# Patient Record
Sex: Male | Born: 1945
Health system: Southern US, Community
[De-identification: ages and names within clinical notes are randomized; demographics above are authoritative.]

## PROBLEM LIST (undated history)

## (undated) DIAGNOSIS — D589 Hereditary hemolytic anemia, unspecified: Secondary | ICD-10-CM

## (undated) DIAGNOSIS — C3491 Malignant neoplasm of unspecified part of right bronchus or lung: Secondary | ICD-10-CM

## (undated) DIAGNOSIS — Z87898 Personal history of other specified conditions: Secondary | ICD-10-CM

## (undated) DIAGNOSIS — I10 Essential (primary) hypertension: Secondary | ICD-10-CM

## (undated) DIAGNOSIS — J189 Pneumonia, unspecified organism: Secondary | ICD-10-CM

## (undated) DIAGNOSIS — J382 Nodules of vocal cords: Secondary | ICD-10-CM

## (undated) HISTORY — DX: Nodules of vocal cords: J38.2

## (undated) HISTORY — DX: Personal history of other specified conditions: Z87.898

## (undated) HISTORY — DX: Essential (primary) hypertension: I10

---

## 1975-12-03 HISTORY — PX: THROAT SURGERY: SHX803

## 1995-11-02 HISTORY — PX: COLONOSCOPY: SHX174

## 2003-07-08 ENCOUNTER — Ambulatory Visit (HOSPITAL_COMMUNITY): Admission: RE | Admit: 2003-07-08 | Discharge: 2003-07-08 | Payer: Self-pay | Admitting: Orthopedic Surgery

## 2003-07-08 ENCOUNTER — Encounter: Payer: Self-pay | Admitting: Orthopedic Surgery

## 2005-04-15 ENCOUNTER — Ambulatory Visit: Payer: Self-pay | Admitting: Internal Medicine

## 2010-12-11 ENCOUNTER — Encounter: Payer: Self-pay | Admitting: Internal Medicine

## 2010-12-11 ENCOUNTER — Ambulatory Visit
Admission: RE | Admit: 2010-12-11 | Discharge: 2010-12-11 | Payer: Self-pay | Source: Home / Self Care | Attending: Urgent Care | Admitting: Urgent Care

## 2010-12-11 DIAGNOSIS — R195 Other fecal abnormalities: Secondary | ICD-10-CM | POA: Insufficient documentation

## 2010-12-14 ENCOUNTER — Encounter: Payer: Self-pay | Admitting: Internal Medicine

## 2010-12-21 ENCOUNTER — Ambulatory Visit (HOSPITAL_COMMUNITY)
Admission: RE | Admit: 2010-12-21 | Discharge: 2010-12-21 | Payer: Self-pay | Source: Home / Self Care | Attending: Internal Medicine | Admitting: Internal Medicine

## 2010-12-24 LAB — DIFFERENTIAL
Lymphocytes Relative: 41 % (ref 12–46)
Lymphs Abs: 3.5 10*3/uL (ref 0.7–4.0)
Monocytes Relative: 9 % (ref 3–12)
Neutro Abs: 4.1 10*3/uL (ref 1.7–7.7)
Neutrophils Relative %: 48 % (ref 43–77)

## 2010-12-24 LAB — CBC
MCHC: 36.6 g/dL — ABNORMAL HIGH (ref 30.0–36.0)
MCV: 86.3 fL (ref 78.0–100.0)
Platelets: 152 10*3/uL (ref 150–400)
RBC: 4.15 MIL/uL — ABNORMAL LOW (ref 4.22–5.81)
RDW: 13 % (ref 11.5–15.5)
WBC: 8.5 10*3/uL (ref 4.0–10.5)

## 2010-12-30 ENCOUNTER — Encounter: Payer: Self-pay | Admitting: Internal Medicine

## 2011-01-01 ENCOUNTER — Encounter (INDEPENDENT_AMBULATORY_CARE_PROVIDER_SITE_OTHER): Payer: Self-pay

## 2011-01-01 NOTE — Op Note (Signed)
  NAME:  Richard Evans, Richard Evans                 ACCOUNT NO.:  [id number]  MEDICAL RECORD NO.:  [id number]          PATIENT TYPE:  AMB  LOCATION:  DAY                           FACILITY:  [facility]  PHYSICIAN:  R. Roetta Sessions, M.D. DATE OF BIRTH:  Sep 14, 1946  DATE OF PROCEDURE:  12/21/2010 DATE OF DISCHARGE:                              OPERATIVE REPORT   PROCEDURE:  Colonoscopy biopsy.  INDICATIONS FOR PROCEDURE:  A 65 year old gentleman, referred by Dr. Sherril Croon in [place] for Hemoccult positive stool.  He is really devoid of any lower GI tract symptoms, last colonoscopy in 1996, found have diverticulosis and external hemorrhoids.  No family history of polyps or colon cancer.  Risks, benefits, limitations, alternatives, and imponderables have been reviewed, questions answered.  Please see the documentation medical record.  PROCEDURE NOTE:  O2 saturation, blood pressure, pulse, and respirations monitored throughout the entirety of the procedure.CONSCIOUS SEDATION:  Versed 5 mg IV and Demerol 100 mg IV in divided doses.  INSTRUMENT:  Pentax video chip system.  FINDINGS:  Digital rectal exam revealed no abnormalities.  Endoscopic findings:  Prep was adequate.  Colon:  Colonic mucosa was surveyed from the rectosigmoid junction through the left transverse right colon, the appendiceal orifice, ileocecal valve/cecum.  These structures well seen and photographed for the record.  From this level, scope was slowly and cautiously withdrawn.  All previously mentioned mucosal surfaces were again seen.  The patient has extensive left-sided diverticula and some surrounding submucosal petechiae.  There was a single diminutive polyp, the midsigmoid was cold biopsied/remainder of colonic mucosa appeared entirely normal.  Scope was pulled down into the rectum where thorough examination of the rectal mucosa including retroflexed view of the anal verge demonstrated a small internal hemorrhoidal tag only.  The  patient tolerated the procedure well.  Cecal withdrawal time 9 minutes.  IMPRESSION:  Small internal hemorrhoids, otherwise normal rectum, left- sided diverticula, some submucosal petechiae of doubtful clinical significance, single diminutive sigmoid polyp, status post cold biopsy removal.  RECOMMENDATIONS: 1. Polyp and diverticulosis literature was provided to Mr. Kassing. 2. Follow up on path. 3. Baseline CBC today. 4. Further recommendations to follow.     Jonathon Bellows, M.D.     RMR/MEDQ  D:  12/21/2010  T:  12/21/2010  Job:  [id number]  cc:   Doreen Beam, MD Fax: [phone]  Electronically Signed by Lorrin Goodell M.D. on 01/01/2011 01:39:14 PM

## 2011-01-02 ENCOUNTER — Encounter: Payer: Self-pay | Admitting: Internal Medicine

## 2011-01-02 DIAGNOSIS — D649 Anemia, unspecified: Secondary | ICD-10-CM | POA: Insufficient documentation

## 2011-01-03 NOTE — Letter (Signed)
Summary: REFERRAL FROM [facility]  REFERRAL FROM [facility]   Imported By: Rexene Alberts 12/14/2010 11:45:31  _____________________________________________________________________  External Attachment:    Type:   Image     Comment:   External Document

## 2011-01-03 NOTE — Letter (Signed)
Summary: [facility] ORDER  [facility] ORDER   Imported By: Ave Filter 12/11/2010 13:52:03  _____________________________________________________________________  External Attachment:    Type:   Image     Comment:   External Document

## 2011-01-03 NOTE — Assessment & Plan Note (Signed)
Summary: BLOOD IN STOOL,CONSULT FOR TCS/SS   Visit Type:  Initial Consult Primary Care Provider:  Dr Sherril Croon  Chief Complaint:  heme positive stool, bloating, and consult for tcs.  History of Present Illness: 65 y/o caucasian male here for evaluation of positive iFOBT and to set up colonoscopy.  c/o abd bloating.  c/o early satiety.  Denies nausea, vomiting, heartburn or indigestion. Denies wt loss.  Denies constipation or diarrhea.  Denies NSAIDS.  Current Problems (verified): 1)  Blood in Stool, Occult  (ICD-792.1)  Current Medications (verified): 1)  Atenolol 25 Mg Tabs (Atenolol) .... Once Daily 2)  Flomax 0.4 Mg Caps (Tamsulosin Hcl) .... Once Daily 3)  Complete Allergy 25 Mg Caps (Diphenhydramine Hcl) .... Once Daily  Allergies (verified): 1)  ! Prednisone  Past History:  Past Medical History: htn hx epistaxis-on ASA, required intervention at [facility] benign vocal cord nodule colonoscopy Dr Karilyn Cota 12/96->diverticulosis/ext hemorrhoids  Past Surgical History: Unremarkable  Family History: No known family history of colorectal carcinoma, IBD, liver or chronic GI problems.  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  Alcohol Use - no, quit 12/01/2009 Illicit Drug Use - no Patient gets regular exercise. Smoking Status:  quit Drug Use:  no Does Patient Exercise:  yes  Review of Systems General:  Denies fever, chills, sweats, anorexia, fatigue, weakness, malaise, weight loss, and sleep disorder. CV:  Denies chest pains, angina, palpitations, syncope, dyspnea on exertion, orthopnea, PND, peripheral edema, and claudication. Resp:  Denies dyspnea at rest, dyspnea with exercise, cough, sputum, wheezing, coughing up blood, and pleurisy. GI:  Denies difficulty swallowing, pain on swallowing, nausea, indigestion/heartburn, vomiting, vomiting blood, abdominal pain, jaundice, diarrhea, constipation, change in  bowel habits, bloody BM's, black BMs, and fecal incontinence. GU:  Complains of urinary frequency; denies urinary burning, blood in urine, urinary hesitancy, nocturnal urination, and urinary incontinence. MS:  Denies joint pain / LOM, joint swelling, joint stiffness, joint deformity, low back pain, muscle weakness, muscle cramps, muscle atrophy, leg pain at night, leg pain with exertion, and shoulder pain / LOM hand / wrist pain (CTS). Derm:  Denies rash, itching, dry skin, hives, moles, warts, and unhealing ulcers. Psych:  Denies depression, anxiety, memory loss, suicidal ideation, hallucinations, paranoia, phobia, and confusion. Heme:  Denies bruising, bleeding, and enlarged lymph nodes.  Vital Signs:  Patient profile:   65 year old male Height:      72 inches Weight:      195 pounds BMI:     26.54 Temp:     97.7 degrees F oral Pulse rate:   60 / minute BP sitting:   130 / 90  (left arm) Cuff size:   regular  Vitals Entered By: Hendricks Limes LPN (December 11, 2010 11:29 AM)  Physical Exam  General:  Well developed, well nourished, no acute distress. Head:  Normocephalic and atraumatic. Eyes:  Sclera clear, no icterus. Ears:  Normal auditory acuity. Nose:  No deformity, discharge,  or lesions. Mouth:  No deformity or lesions, dentition normal. Neck:  Supple; no masses or thyromegaly. Lungs:  Clear throughout to auscultation. Heart:  Regular rate and rhythm; no murmurs, rubs,  or bruits. Abdomen:  Soft, nontender and nondistended. No masses, hepatosplenomegaly or hernias noted. Normal bowel sounds.without guarding and without rebound.   Msk:  Symmetrical with no gross deformities. Normal posture. Pulses:  Normal pulses noted. Extremities:  No clubbing, cyanosis, edema or deformities noted. Neurologic:  Alert and  oriented x4;  grossly normal neurologically. Skin:  Intact without significant lesions or rashes. Cervical Nodes:  No significant cervical adenopathy. Psych:  Alert and  cooperative. Normal mood and affect.  Impression & Recommendations:  Problem # 1:  BLOOD IN STOOL, OCCULT (ICD-792.1)  65 y/o caucasian male w/ iFOBT positive.  Needs colonoscopy to r/o colorectal carcinoma.  Devoid of any other GI symptoms.  Diagnostic colonoscopy to be performed by Dr. Suszanne Conners Rourk in the near future.  I have discussed risks and benefits which include, but are not limited to, bleeding, infection, perforation, or medication reaction.  The patient agrees with this plan and consent will be obtained.  Orders: Consultation Level III [phone])

## 2011-01-09 NOTE — Miscellaneous (Signed)
Summary: Orders Update  Clinical Lists Changes  Problems: Added new problem of ANEMIA, MILD (ICD-285.9) Orders: Added new Test order of T-CBC w/Diff [phone]) - Signed

## 2011-01-09 NOTE — Letter (Addendum)
Summary: Patient Notice, Colon Biopsy Results  [facility]  [address]   [place], [place] [address]   Phone: [phone]  Fax: [phone]       December 30, 2010   ASHAZ ROBLING [address] [place], [place]  [address] 1946/03/14    Dear Mr. MCIVER,  I am pleased to inform you that the biopsies taken during your recent colonoscopy did not show any evidence of cancer upon pathologic examination.  Additional information/recommendations:  No further action is needed at this time.  Please follow-up with your primary care physician for your other healthcare needs.  You should have a repeat colonoscopy examination  in 10 years.  Please call us if you are having persistent problems or have questions about your condition that have not been fully answered at this time.  Sincerely,    R. Roetta Sessions MD, FACP [facility] Ph: [phone]    Fax: [phone]   Appended Document: Patient Notice, Colon Biopsy Results letter mailed to pt  Appended Document: Patient Notice, Colon Biopsy Results reminder in epic

## 2011-01-09 NOTE — Miscellaneous (Signed)
Summary: CBC  Clinical Lists Changes L-CBC-with Differential - STATUS: Final                                            Perform Date: 20Jan12 10:51  Ordered By: Jena Gauss MD , Gerrit Friends           Ordered Date: 20Jan12 10:42                                       Last Updated Date: 20Jan12 11:26  Facility: [facility]                               Department: [facility]  Accession #: [id number]                   USN:       [id number]  Findings  Result Name                              Result     Abnl   Normal Range     Units      Perf. Loc.  WBC                                           8.5               4.0-10.5         K/uL  RBC                                           4.15       l      4.22-5.81        MIL/uL  Hemoglobin (HGB)                       13.1              13.0-17.0        g/dL  Hematocrit (HCT)                         35.8       l      39.0-52.0        %  MCV                                           86.3              78.0-100.0       fL  MCH -                                         31.6              26.0-34.0  pg  MCHC                                         36.6       h      30.0-36.0        g/dL  RDW                                          13.0              11.5-15.5        %  Platelet Count (PLT)                     152               150-400          K/uL  Neutrophils, %                              48                43-77            %  Lymphocytes, %                           41                12-46            %  Monocytes, %                               9                 3-12             %  Eosinophils, %                               1                 0-5              %  Basophils, %                                  0                 0-1              %  Neutrophils, Absolute                     4.1               1.7-7.7          K/uL  Lymphocytes, Absolute                  3.5               0.7-4.0          K/uL  Monocytes, Absolute  0.8                0.1-1.0          K/uL  Eosinophils, Absolute                      0.1               0.0-0.7          K/uL  Basophils, Absolute                        0.0               0.0-0.1          K/uL  Additional Information  HL7 RESULT STATUS : F  External IF Update Timestamp : 2010-12-21:11:24:00.000000  Appended Document: CBC mild anemia; would repeat cbc in 6 weeks  Appended Document: CBC pt aware, lab order on file

## 2011-01-31 ENCOUNTER — Encounter (INDEPENDENT_AMBULATORY_CARE_PROVIDER_SITE_OTHER): Payer: Self-pay | Admitting: *Deleted

## 2011-02-07 NOTE — Letter (Signed)
Summary: Recall, Labs Needed  [facility]  [address]   [place], [place] [address]   Phone: [phone]  Fax: [phone]    January 31, 2011  Richard Evans [address] [place], [place]  [address] 1946/10/28   Dear Richard Evans,   Our records indicate it is time to repeat your blood work.  You can take the enclosed form to the lab on or near the date indicated.  Please make note of the new location of the lab:   [address], [address] floor   [facility]  Our office will call you within a week to ten business days with the results.  If you do not hear from [place] in 10 business days, you should call the office.  If you have any questions regarding this, call the office at [phone], and ask for the nurse.  Labs are due on  13 February 2011  Sincerely,    Carolan Clines LPN  [facility] Ph: [phone]   Fax: [phone]

## 2011-02-13 ENCOUNTER — Encounter: Payer: Self-pay | Admitting: Internal Medicine

## 2011-02-18 ENCOUNTER — Encounter: Payer: Self-pay | Admitting: Internal Medicine

## 2011-02-18 LAB — CONVERTED CEMR LAB
Eosinophils Absolute: 0.2 10*3/uL (ref 0.0–0.7)
Lymphocytes Relative: 43 % (ref 12–46)
Lymphs Abs: 3.3 10*3/uL (ref 0.7–4.0)
MCHC: 36.2 g/dL — ABNORMAL HIGH (ref 30.0–36.0)
Monocytes Absolute: 0.8 10*3/uL (ref 0.1–1.0)
Neutro Abs: 3.3 10*3/uL (ref 1.7–7.7)
Neutrophils Relative %: 43 % (ref 43–77)
RBC: 4.02 M/uL — ABNORMAL LOW (ref 4.22–5.81)
WBC: 7.6 10*3/uL (ref 4.0–10.5)

## 2011-02-28 NOTE — Miscellaneous (Signed)
Summary: Orders Update  Clinical Lists Changes  Orders: Added new Test order of T-CBC w/Diff ([id number]) - Signed 

## 2011-06-12 ENCOUNTER — Other Ambulatory Visit: Payer: Self-pay | Admitting: Internal Medicine

## 2011-06-12 LAB — CBC WITH DIFFERENTIAL/PLATELET
Hemoglobin: 13.4 g/dL (ref 13.0–17.0)
Lymphs Abs: 3 10*3/uL (ref 0.7–4.0)
Monocytes Relative: 10 % (ref 3–12)
Neutro Abs: 3.5 10*3/uL (ref 1.7–7.7)
Neutrophils Relative %: 47 % (ref 43–77)
Platelets: 157 10*3/uL (ref 150–400)
RBC: 3.85 MIL/uL — ABNORMAL LOW (ref 4.22–5.81)
WBC: 7.4 10*3/uL (ref 4.0–10.5)

## 2016-02-08 DIAGNOSIS — I10 Essential (primary) hypertension: Secondary | ICD-10-CM | POA: Diagnosis not present

## 2016-02-08 DIAGNOSIS — K5792 Diverticulitis of intestine, part unspecified, without perforation or abscess without bleeding: Secondary | ICD-10-CM | POA: Diagnosis not present

## 2016-02-08 DIAGNOSIS — Z87891 Personal history of nicotine dependence: Secondary | ICD-10-CM | POA: Diagnosis not present

## 2016-03-19 DIAGNOSIS — M25561 Pain in right knee: Secondary | ICD-10-CM | POA: Diagnosis not present

## 2016-06-28 DIAGNOSIS — R0789 Other chest pain: Secondary | ICD-10-CM | POA: Diagnosis not present

## 2016-06-28 DIAGNOSIS — R0600 Dyspnea, unspecified: Secondary | ICD-10-CM | POA: Diagnosis not present

## 2016-06-30 ENCOUNTER — Encounter (HOSPITAL_COMMUNITY): Payer: Self-pay

## 2016-06-30 ENCOUNTER — Emergency Department (HOSPITAL_COMMUNITY): Payer: Medicare Other

## 2016-06-30 ENCOUNTER — Inpatient Hospital Stay (HOSPITAL_COMMUNITY)
Admission: EM | Admit: 2016-06-30 | Discharge: 2016-07-04 | DRG: 810 | Disposition: A | Discharge status: Home / Self Care | Payer: Medicare Other | Attending: Family Medicine | Admitting: Family Medicine

## 2016-06-30 DIAGNOSIS — K5790 Diverticulosis of intestine, part unspecified, without perforation or abscess without bleeding: Secondary | ICD-10-CM | POA: Diagnosis present

## 2016-06-30 DIAGNOSIS — R0602 Shortness of breath: Secondary | ICD-10-CM | POA: Diagnosis not present

## 2016-06-30 DIAGNOSIS — R5383 Other fatigue: Secondary | ICD-10-CM | POA: Diagnosis not present

## 2016-06-30 DIAGNOSIS — R161 Splenomegaly, not elsewhere classified: Secondary | ICD-10-CM | POA: Diagnosis not present

## 2016-06-30 DIAGNOSIS — D589 Hereditary hemolytic anemia, unspecified: Secondary | ICD-10-CM | POA: Diagnosis present

## 2016-06-30 DIAGNOSIS — Z87891 Personal history of nicotine dependence: Secondary | ICD-10-CM

## 2016-06-30 DIAGNOSIS — D591 Other autoimmune hemolytic anemias: Secondary | ICD-10-CM | POA: Diagnosis not present

## 2016-06-30 DIAGNOSIS — I2510 Atherosclerotic heart disease of native coronary artery without angina pectoris: Secondary | ICD-10-CM | POA: Diagnosis present

## 2016-06-30 DIAGNOSIS — D598 Other acquired hemolytic anemias: Secondary | ICD-10-CM | POA: Diagnosis not present

## 2016-06-30 DIAGNOSIS — N400 Enlarged prostate without lower urinary tract symptoms: Secondary | ICD-10-CM | POA: Diagnosis present

## 2016-06-30 DIAGNOSIS — I4510 Unspecified right bundle-branch block: Secondary | ICD-10-CM | POA: Diagnosis present

## 2016-06-30 DIAGNOSIS — D588 Other specified hereditary hemolytic anemias: Secondary | ICD-10-CM | POA: Diagnosis not present

## 2016-06-30 DIAGNOSIS — K5730 Diverticulosis of large intestine without perforation or abscess without bleeding: Secondary | ICD-10-CM | POA: Diagnosis not present

## 2016-06-30 DIAGNOSIS — I10 Essential (primary) hypertension: Secondary | ICD-10-CM

## 2016-06-30 DIAGNOSIS — D72829 Elevated white blood cell count, unspecified: Secondary | ICD-10-CM | POA: Diagnosis not present

## 2016-06-30 DIAGNOSIS — D649 Anemia, unspecified: Secondary | ICD-10-CM | POA: Diagnosis present

## 2016-06-30 DIAGNOSIS — R739 Hyperglycemia, unspecified: Secondary | ICD-10-CM

## 2016-06-30 DIAGNOSIS — R17 Unspecified jaundice: Secondary | ICD-10-CM

## 2016-06-30 DIAGNOSIS — D582 Other hemoglobinopathies: Secondary | ICD-10-CM

## 2016-06-30 LAB — URINALYSIS, ROUTINE W REFLEX MICROSCOPIC
GLUCOSE, UA: NEGATIVE mg/dL
Hgb urine dipstick: NEGATIVE
KETONES UR: NEGATIVE mg/dL
LEUKOCYTES UA: NEGATIVE
Nitrite: NEGATIVE
PROTEIN: NEGATIVE mg/dL
Specific Gravity, Urine: 1.018 (ref 1.005–1.030)
pH: 5.5 (ref 5.0–8.0)

## 2016-06-30 LAB — IRON AND TIBC
Iron: 141 ug/dL (ref 45–182)
Saturation Ratios: 57 % — ABNORMAL HIGH (ref 17.9–39.5)
TIBC: 249 ug/dL — ABNORMAL LOW (ref 250–450)
UIBC: 108 ug/dL

## 2016-06-30 LAB — CBC
HCT: 22.4 % — ABNORMAL LOW (ref 39.0–52.0)
HCT: 22.8 % — ABNORMAL LOW (ref 39.0–52.0)
HEMOGLOBIN: 7.5 g/dL — AB (ref 13.0–17.0)
HEMOGLOBIN: 7.9 g/dL — AB (ref 13.0–17.0)
MCH: 35.5 pg — AB (ref 26.0–34.0)
MCH: 35.4 pg — AB (ref 26.0–34.0)
MCHC: 33.5 g/dL (ref 30.0–36.0)
MCHC: 34.6 g/dL (ref 30.0–36.0)
MCV: 106.2 fL — ABNORMAL HIGH (ref 78.0–100.0)
MCV: 102.2 fL — AB (ref 78.0–100.0)
Platelets: 169 10*3/uL (ref 150–400)
Platelets: 164 10*3/uL (ref 150–400)
RBC: 2.11 MIL/uL — AB (ref 4.22–5.81)
RBC: 2.23 MIL/uL — AB (ref 4.22–5.81)
RDW: 23.1 % — ABNORMAL HIGH (ref 11.5–15.5)
RDW: 21.8 % — ABNORMAL HIGH (ref 11.5–15.5)
WBC: 14.1 10*3/uL — ABNORMAL HIGH (ref 4.0–10.5)
WBC: 12.9 10*3/uL — ABNORMAL HIGH (ref 4.0–10.5)

## 2016-06-30 LAB — DIC (DISSEMINATED INTRAVASCULAR COAGULATION) PANEL
APTT: 39 s — AB (ref 24–36)
FIBRINOGEN: 327 mg/dL (ref 210–475)
PLATELETS: 175 10*3/uL (ref 150–400)
SMEAR REVIEW: NONE SEEN

## 2016-06-30 LAB — DIFFERENTIAL
BASOS PCT: 0 %
Basophils Absolute: 0 10*3/uL (ref 0.0–0.1)
EOS PCT: 1 %
Eosinophils Absolute: 0.1 10*3/uL (ref 0.0–0.7)
Lymphocytes Relative: 53 %
Lymphs Abs: 6.6 10*3/uL — ABNORMAL HIGH (ref 0.7–4.0)
MONOS PCT: 10 %
Monocytes Absolute: 1.2 10*3/uL — ABNORMAL HIGH (ref 0.1–1.0)
NEUTROS ABS: 4.5 10*3/uL (ref 1.7–7.7)
Neutrophils Relative %: 36 %

## 2016-06-30 LAB — COMPREHENSIVE METABOLIC PANEL
ALT: 27 U/L (ref 17–63)
ANION GAP: 6 (ref 5–15)
AST: 41 U/L (ref 15–41)
Albumin: 4.1 g/dL (ref 3.5–5.0)
Alkaline Phosphatase: 70 U/L (ref 38–126)
BILIRUBIN TOTAL: 4.7 mg/dL — AB (ref 0.3–1.2)
BUN: 20 mg/dL (ref 6–20)
CO2: 23 mmol/L (ref 22–32)
Calcium: 8.5 mg/dL — ABNORMAL LOW (ref 8.9–10.3)
Chloride: 107 mmol/L (ref 101–111)
Creatinine, Ser: 1.07 mg/dL (ref 0.61–1.24)
GFR calc Af Amer: >60 mL/min (ref >60)
Glucose, Bld: 218 mg/dL — ABNORMAL HIGH (ref 65–99)
POTASSIUM: 3.9 mmol/L (ref 3.5–5.1)
Sodium: 136 mmol/L (ref 135–145)
TOTAL PROTEIN: 6.6 g/dL (ref 6.5–8.1)

## 2016-06-30 LAB — DIC (DISSEMINATED INTRAVASCULAR COAGULATION)PANEL
D-Dimer, Quant: 2.7 ug/mL-FEU — ABNORMAL HIGH (ref 0.00–0.50)
INR: 1.21
Prothrombin Time: 15.3 seconds — ABNORMAL HIGH (ref 11.4–15.2)

## 2016-06-30 LAB — DIRECT ANTIGLOBULIN TEST (NOT AT ARMC)
DAT, COMPLEMENT: POSITIVE
DAT, IgG: POSITIVE

## 2016-06-30 LAB — I-STAT TROPONIN, ED: TROPONIN I, POC: 0 ng/mL (ref 0.00–0.08)

## 2016-06-30 LAB — RETICULOCYTES
RBC.: 1.3 MIL/uL — ABNORMAL LOW (ref 4.22–5.81)
RETIC COUNT ABSOLUTE: 258.7 10*3/uL — AB (ref 19.0–186.0)
RETIC CT PCT: 19.9 % — AB (ref 0.4–3.1)

## 2016-06-30 LAB — HEPATIC FUNCTION PANEL
ALT: 29 U/L (ref 17–63)
AST: 52 U/L — AB (ref 15–41)
Albumin: 3.9 g/dL (ref 3.5–5.0)
Alkaline Phosphatase: 69 U/L (ref 38–126)
BILIRUBIN DIRECT: 0.6 mg/dL — AB (ref 0.1–0.5)
BILIRUBIN INDIRECT: 4.3 mg/dL — AB (ref 0.3–0.9)
Total Bilirubin: 4.9 mg/dL — ABNORMAL HIGH (ref 0.3–1.2)
Total Protein: 6.7 g/dL (ref 6.5–8.1)

## 2016-06-30 LAB — LACTATE DEHYDROGENASE: LDH: 575 U/L — ABNORMAL HIGH (ref 98–192)

## 2016-06-30 LAB — PROTIME-INR
INR: 1.2
PROTHROMBIN TIME: 15.3 s — AB (ref 11.4–15.2)

## 2016-06-30 LAB — FERRITIN: FERRITIN: 739 ng/mL — AB (ref 24–336)

## 2016-06-30 LAB — VITAMIN B12: Vitamin B-12: 301 pg/mL (ref 180–914)

## 2016-06-30 LAB — FOLATE: Folate: 17.5 ng/mL (ref >5.9)

## 2016-06-30 LAB — SAVE SMEAR

## 2016-06-30 LAB — TSH: TSH: 2.262 u[IU]/mL (ref 0.350–4.500)

## 2016-06-30 LAB — LIPASE, BLOOD: LIPASE: 36 U/L (ref 11–51)

## 2016-06-30 LAB — BRAIN NATRIURETIC PEPTIDE: B NATRIURETIC PEPTIDE 5: 163.2 pg/mL — AB (ref 0.0–100.0)

## 2016-06-30 LAB — POC OCCULT BLOOD, ED: FECAL OCCULT BLD: NEGATIVE

## 2016-06-30 MED ORDER — ACETAMINOPHEN 650 MG RE SUPP: 650.0000 mg | Freq: Four times a day (QID) | RECTAL | Status: DC | PRN

## 2016-06-30 MED ORDER — DIPHENHYDRAMINE HCL 25 MG PO TABS
25.0000 mg | ORAL_TABLET | Freq: Every day | ORAL | Status: DC
  Filled 2016-06-30: qty 1

## 2016-06-30 MED ORDER — SODIUM CHLORIDE 0.9% FLUSH: 3.0000 mL | INTRAVENOUS | Status: DC | PRN

## 2016-06-30 MED ORDER — SODIUM CHLORIDE 0.9% FLUSH
3.0000 mL | Freq: Two times a day (BID) | INTRAVENOUS | Status: DC
  Administered 2016-06-30 – 2016-07-04 (×5): 3 mL via INTRAVENOUS

## 2016-06-30 MED ORDER — TAMSULOSIN HCL 0.4 MG PO CAPS
0.4000 mg | ORAL_CAPSULE | Freq: Every day | ORAL | Status: DC
  Administered 2016-06-30 – 2016-07-03 (×4): 0.4 mg via ORAL
  Filled 2016-06-30 (×4): qty 1

## 2016-06-30 MED ORDER — ACETAMINOPHEN 325 MG PO TABS: 650.0000 mg | ORAL_TABLET | Freq: Four times a day (QID) | ORAL | Status: DC | PRN

## 2016-06-30 MED ORDER — ENOXAPARIN SODIUM 40 MG/0.4ML ~~LOC~~ SOLN
40.0000 mg | SUBCUTANEOUS | Status: DC
  Administered 2016-06-30 – 2016-07-03 (×4): 40 mg via SUBCUTANEOUS
  Filled 2016-06-30 (×4): qty 0.4

## 2016-06-30 MED ORDER — IOPAMIDOL (ISOVUE-370) INJECTION 76%
INTRAVENOUS | Status: AC
  Administered 2016-06-30: 100 mL
  Filled 2016-06-30: qty 100

## 2016-06-30 MED ORDER — POLYETHYLENE GLYCOL 3350 17 G PO PACK: 17.0000 g | PACK | Freq: Every day | ORAL | Status: DC | PRN

## 2016-06-30 MED ORDER — ATENOLOL 25 MG PO TABS
25.0000 mg | ORAL_TABLET | Freq: Every day | ORAL | Status: DC
  Administered 2016-06-30 – 2016-07-03 (×4): 25 mg via ORAL
  Filled 2016-06-30 (×4): qty 1

## 2016-06-30 MED ORDER — DIPHENHYDRAMINE HCL 25 MG PO CAPS
25.0000 mg | ORAL_CAPSULE | Freq: Every day | ORAL | Status: DC
  Administered 2016-06-30 – 2016-07-03 (×4): 25 mg via ORAL
  Filled 2016-06-30 (×4): qty 1

## 2016-06-30 MED ORDER — METHYLPREDNISOLONE SODIUM SUCC 125 MG IJ SOLR
125.0000 mg | Freq: Once | INTRAMUSCULAR | Status: AC
  Administered 2016-06-30: 125 mg via INTRAVENOUS
  Filled 2016-06-30: qty 2

## 2016-06-30 MED ORDER — SODIUM CHLORIDE 0.9 % IV SOLN
250.0000 mL | INTRAVENOUS | Status: DC | PRN
Start: 2016-06-30 — End: 2016-07-04

## 2016-06-30 NOTE — H&P (Signed)
[facility] Admission History and Physical Service Pager: [phone]  Patient name: MERLE CIRELLI Medical record number: [id number] Date of birth: August 22, 1946 Age: 70 y.o. Gender: male  Primary Care Provider: [name], MD Consultants: Hematology Code Status: FULL  Chief Complaint: SOB and jaundice  Assessment and Plan: ELIAM SNAPP is a 70 y.o. male presenting with SOB and jaundice. PMH is significant for HTN, h/o epistaxis w/anemia, diverticulosis.   SOB in the setting of anemia with jaundice. Hgb 7.5 with Tbili 4.9 and indirect bili of 4.3. CT abd shows splenomegaly without cirrhosis or pancreatic mass. CT chest shows b/l hilar adenopathy, neg for PE. FOBT neg. Has scleral icterus. No pruritis. LFTs WNL. UA positive for bilirubin and negative for HgB. Given presentation of anemia, jaundice, lack of active bleeding, and h/o dark urine concerned for hemolytic anemia. Jaundice unlikely to be caused by hepatobiliary system as LFTs and Alk Phos are WNL and CT abdomen is unremarkable. SOB likely secondary to symptomatic anemia. Patient does have mildly elevated BNP (162) but no signs of volume overload on exam. Unknown etiology of potential hemolytic anemia. Patient does not have h/o recent infections, not septic at presentation, no offending medications. Potentially an autoimmune cause or trapping of RBCs in spleen (CT abdomen showed progressive splenomegaly from previous CT in 2013).  - Admit to inpatient, under Dr. Andria Frames - Heme/onc consulted, appreciate recomendations. - Per heme/onc, transfuse Hgb <7 and steroids: recommended prednisone 60m/kg up to 1058m Given solumedrol 12521m 1 in ED d/t intolerance of prednisone. - LDH, haptoglobin, and retic count pending to confirm hemolytic anemia  - coombs test -peripheral smear ordered  - monitor H/H - DIC panel pending -type and screen ordered  -insert 2nd peripheral large bore IV  -ferritin, iron panel, folate,  vitamin b12 pending   HTN at home on atenolol 52m57ms. BP stable at admission.  - continue home med  BPH at home on flomax 0.4mg 26m- continue home med  CAD found on CT imaging. Patient states has been on medication in the past, was taken off by PCP d/t good control.  - consider start atorvastatin 40mg 57miven risk factors -patient likely not a good candidate for daily ASA given h/o severe epistasis with ASA   FEN/GI: heart healthy diet Prophylaxis: lovenox  Disposition: pending  History of Present Illness:  Candice MICKEL SCHREUR70 y.o13male presenting with shortness of breath and yellow skin. [place], returned 2 weeks ago and noticed was getting winded with exertion, progressively worsening. Has previously been able to cut grass/heavy chores but became short of breath walking to his garden or to his mailbox. Went to PCP on Friday and had EKG but otherwise no work up. Denies SOB at rest. No orthopnea. Has never had a similar episode in the past despite going camping in summer heat on a yearly basis.  Is accompanied by wife and daughter, who noticed that he started to appear yellow. Patient is not sure when he started looking yellow but first time anyone noticed it was today. Is very tan at baseline. Also endorses having dark urine down at the beach present for 3-4 weeks and yellow stools since yesterday. Denies melena and hematochezia. No history of jaundice. No recent illnesses. No weight loss, night sweats. No abdominal pain or chest pain. No epistaxis or bleeding gums. Denies active bleeding. No new medications or antibiotics.   Review  Of Systems: Per HPI otherwise the remainder of the systems were negative.  Patient Active Problem List   Diagnosis Date Noted  . Hemolytic anemia ([place]) 06/30/2016  . ANEMIA, MILD 01/02/2011  . BLOOD IN STOOL, OCCULT 12/11/2010    Past Medical History: Past Medical History:  Diagnosis Date  .  History of epistaxis    hx [facility]axis [facility]SA[facility]  . Hypertension   . Vocal cord nodule    benign    Past Surgical History: Past Surgical History:  Procedure Laterality Date  . COLONOSCOPY  12/96   Dr. Rehman>diverticulosis/ external hemorrhoids    Social History: Social History  Substance Use Topics  . Smoking status: Never Smoker  . Smokeless tobacco: Never Used  . Alcohol use Not on file   Additional social history: Lives at home with his wife. Quit smoking 7 years ago. Smoked 1.5 ppd for 50 years. No current alcohol use. No drug use.   Please also refer to relevant sections of EMR.  Family History: No family history of autoimmune conditions, pancreatitis, or jaundice.   Allergies and Medications: Allergies  Allergen Reactions  . Prednisone Other (See Comments)    CHEST PAINS  . Adhesive [Tape] Itching and Rash   No current facility-administered medications on file prior to encounter.    Current Outpatient Prescriptions on File Prior to Encounter  Medication Sig Dispense Refill  . atenolol (TENORMIN) 25 MG tablet Take 25 mg by mouth at bedtime.     . diphenhydrAMINE (BENADRYL) 25 MG tablet Take 25 mg by mouth at bedtime.     . Tamsulosin HCl (FLOMAX) 0.4 MG CAPS Take 0.4 mg by mouth daily after supper.       Objective: BP 140/67   Pulse 65   Temp 98.6 F (37 C) (Oral)   Resp 12   Ht 6' (1.829 m)   Wt 191 lb (86.6 kg)   SpO2 94%   BMI 25.90 kg/m  Exam: General: Laying comfortably in bed, in NAD Eyes: scleral icterus, yellow conjunctiva, EOMI, PERRL ENTM: MMM, Oropharynx clear  Neck: supple, no lymphadenopathy Cardiovascular: RRR, no murmurs noted, 2+ pedal pulses, no LE edema  Respiratory: CTAB, On room air. Normal effort Abdomen: mildly distended. Soft, nt, + bs. No rebound or guarding, negative murphy's sign, appreciable splenomegaly  MSK: moving limbs spontaneously Skin: Appears jaundiced. 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 Neuro: A&Ox3, no focal deficits Psych: appropriate affect, very pleasant  Labs and Imaging:  CBC    Component Value Date/Time   WBC 14.1 (H) 06/30/2016 1200   RBC 2.11 (L) 06/30/2016 1200   HGB 7.5 (L) 06/30/2016 1200   HCT 22.4 (L) 06/30/2016 1200   PLT 169 06/30/2016 1200   MCV 106.2 (H) 06/30/2016 1200   MCH 35.5 (H) 06/30/2016 1200   MCHC 33.5 06/30/2016 1200   RDW 23.1 (H) 06/30/2016 1200   LYMPHSABS 3.0 06/12/2011 0759   MONOABS 0.8 06/12/2011 0759   EOSABS 0.2 06/12/2011 0759   BASOSABS 0.0 06/12/2011 0759   Total Bilirubin 4.7 Indirect bilirubin 4.3 Direct bilirubin 0.6  Trop 0.00  BMET   Recent Labs Lab 06/30/16 1032  NA 136  K 3.9  CL 107  CO2 23  BUN 20  CREATININE 1.07  GLUCOSE 218*  CALCIUM 8.5*     Urinalysis    Component Value Date/Time  COLORURINE AMBER (A) 06/30/2016 1406   APPEARANCEUR CLEAR 06/30/2016 1406   LABSPEC 1.018 06/30/2016 1406   PHURINE 5.5 06/30/2016 1406   GLUCOSEU NEGATIVE 06/30/2016 1406   HGBUR NEGATIVE 06/30/2016 1406   BILIRUBINUR SMALL (A) 06/30/2016 1406   KETONESUR NEGATIVE 06/30/2016 1406   PROTEINUR NEGATIVE 06/30/2016 1406   NITRITE NEGATIVE 06/30/2016 1406   [place] 06/30/2016 1406     Dg Chest 2 View  Result Date: 06/30/2016 CLINICAL DATA:  Shortness breath over the last week with increasing weakness and lack of energy. EXAM: CHEST  2 VIEW COMPARISON:  None. FINDINGS: The heart size is normal. Mild interstitial coarsening appears chronic. There is retraction and scarring at the lung apices bilaterally, right greater than left. This is not significantly changed. No focal airspace disease is present. There is no edema or effusion to suggest failure. The visualized soft tissues and bony thorax are unremarkable. IMPRESSION: 1. No acute cardiopulmonary disease. 2. Mild interstitial  coarsening appears chronic. Electronically Signed   By: San Morelle M.D.   On: 06/30/2016 10:51  Ct Angio Chest Pe W Or Wo Contrast  Result Date: 06/30/2016 CLINICAL DATA:  Shortness breath and weakness for 1 week with progression. Painless jaundice. Shortness of breath. EXAM: CT ANGIOGRAPHY CHEST CT ABDOMEN AND PELVIS WITH CONTRAST TECHNIQUE: Multidetector CT imaging of the chest was performed using the standard protocol during bolus administration of intravenous contrast. Multiplanar CT image reconstructions and MIPs were obtained to evaluate the vascular anatomy. Multidetector CT imaging of the abdomen and pelvis was performed using the standard protocol during bolus administration of intravenous contrast. CONTRAST:  100 mL Isovue 370 COMPARISON:  CT of the abdomen and pelvis without contrast 04/02/2012 and 07/25/2011 FINDINGS: CTA CHEST FINDINGS Mediastinum/Lymph nodes: Pulmonary arterial opacification is excellent. There are no focal filling defects to suggest pulmonary emboli. The heart size is normal. Coronary artery calcifications are present. No significant pleural or pericardial effusion is present. Bilateral hilar adenopathy is present. Right-sided nodes measure up to 16 x 10 mm. Left-sided hilar soft tissue measures up to 21 x 14 mm. Subcarinal soft tissue measures up to 2 cm. Sub cm peritracheal nodes are present. Sub cm prevascular nodes are present. Vascular calcifications are present at the aortic arch without aneurysm or significant stenosis. Lungs/Pleura: Use interstitial prominence is noted. Extensive interlobular thickening is present. No significant reticulation is present. Emphysematous changes are noted at the lung apices, right greater than left. No significant airspace consolidation is present. There is some dependent atelectasis bilaterally. Musculoskeletal: Vertebral body heights alignment are maintained. The ribs are within normal limits. No focal lytic or blastic lesions are  present. CT ABDOMEN and PELVIS FINDINGS Hepatobiliary: No focal hepatic lesions are present. There is mild diffuse fatty infiltration of the liver. The common bile duct and gallbladder are within normal limits. Pancreas: No significant inflammatory changes are present. No solid or cystic mass lesion is present. There is no ductal dilation. Spleen: Splenomegaly has progressed. No focal lesions are present. Cephalo-caudad dimension is 14.6 cm. Adrenals/Urinary tract: The adrenal glands are normal bilaterally. A 17 mm cyst is noted posteriorly in the right kidney. An exophytic cyst laterally near the lower pole of the right kidney has slightly increased in size measuring 19 mm. No other discrete lesions are present. A punctate nonobstructing stone is again seen at the upper pole of the right kidney. The ureters are within normal limits bilaterally. A diverticulum is again noted posteriorly and laterally on the left within the urinary bladder.  The urinary bladder is otherwise within normal limits. Stomach/Bowel: The stomach and duodenum are within normal limits. The small bowel is unremarkable. The appendix is visualized and normal. The cecum and ascending colon are within normal limits. The transverse colon is unremarkable. Diverticular changes are present in the descending and sigmoid colon without focal inflammation to suggest diverticulitis. The rectum is within normal limits. Vascular/Lymphatic: Dense atherosclerotic calcifications are present throughout the aorta and branch vessels without aneurysm. Reproductive: The prostate gland is enlarged, measuring up to 5.2 cm, not significantly changed. Other: Fat herniates into the left inguinal canal without bowel. Musculoskeletal: Bilateral L5 pars defects are present. Grade 1 anterolisthesis and a vacuum disc at L5-S1 are stable. Vertebral body heights and alignment are otherwise normal. The bony pelvis is intact otherwise. Review of the MIP images confirms the above  findings. IMPRESSION: 1. No evidence for pulmonary embolus. 2. Mild diffuse interstitial changes and bilateral hilar adenopathy raise concern for sarcoid. No primary neoplasm is present. No focal pneumonia is present. Other interstitial disease is considered less likely. 3. Emphysema. 4. Progressive splenomegaly of unknown etiology. Early cirrhosis is not excluded in this patient with hepatic steatosis. 5. Right renal cyst. 6. Extensive atherosclerotic change without aneurysm. Coronary artery disease is present. 7. Sigmoid diverticulosis without diverticulitis. 8. Prostatic enlargement. 9. Left posterolateral bladder diverticulum without complication. Electronically Signed   By: San Morelle M.D.   On: 06/30/2016 13:45  Ct Abdomen Pelvis W Contrast  Result Date: 06/30/2016 CLINICAL DATA:  Shortness breath and weakness for 1 week with progression. Painless jaundice. Shortness of breath. EXAM: CT ANGIOGRAPHY CHEST CT ABDOMEN AND PELVIS WITH CONTRAST TECHNIQUE: Multidetector CT imaging of the chest was performed using the standard protocol during bolus administration of intravenous contrast. Multiplanar CT image reconstructions and MIPs were obtained to evaluate the vascular anatomy. Multidetector CT imaging of the abdomen and pelvis was performed using the standard protocol during bolus administration of intravenous contrast. CONTRAST:  100 mL Isovue 370 COMPARISON:  CT of the abdomen and pelvis without contrast 04/02/2012 and 07/25/2011 FINDINGS: CTA CHEST FINDINGS Mediastinum/Lymph nodes: Pulmonary arterial opacification is excellent. There are no focal filling defects to suggest pulmonary emboli. The heart size is normal. Coronary artery calcifications are present. No significant pleural or pericardial effusion is present. Bilateral hilar adenopathy is present. Right-sided nodes measure up to 16 x 10 mm. Left-sided hilar soft tissue measures up to 21 x 14 mm. Subcarinal soft tissue measures up to 2 cm.  Sub cm peritracheal nodes are present. Sub cm prevascular nodes are present. Vascular calcifications are present at the aortic arch without aneurysm or significant stenosis. Lungs/Pleura: Use interstitial prominence is noted. Extensive interlobular thickening is present. No significant reticulation is present. Emphysematous changes are noted at the lung apices, right greater than left. No significant airspace consolidation is present. There is some dependent atelectasis bilaterally. Musculoskeletal: Vertebral body heights alignment are maintained. The ribs are within normal limits. No focal lytic or blastic lesions are present. CT ABDOMEN and PELVIS FINDINGS Hepatobiliary: No focal hepatic lesions are present. There is mild diffuse fatty infiltration of the liver. The common bile duct and gallbladder are within normal limits. Pancreas: No significant inflammatory changes are present. No solid or cystic mass lesion is present. There is no ductal dilation. Spleen: Splenomegaly has progressed. No focal lesions are present. Cephalo-caudad dimension is 14.6 cm. Adrenals/Urinary tract: The adrenal glands are normal bilaterally. A 17 mm cyst is noted posteriorly in the right kidney. An exophytic cyst laterally near  the lower pole of the right kidney has slightly increased in size measuring 19 mm. No other discrete lesions are present. A punctate nonobstructing stone is again seen at the upper pole of the right kidney. The ureters are within normal limits bilaterally. A diverticulum is again noted posteriorly and laterally on the left within the urinary bladder. The urinary bladder is otherwise within normal limits. Stomach/Bowel: The stomach and duodenum are within normal limits. The small bowel is unremarkable. The appendix is visualized and normal. The cecum and ascending colon are within normal limits. The transverse colon is unremarkable. Diverticular changes are present in the descending and sigmoid colon without  focal inflammation to suggest diverticulitis. The rectum is within normal limits. Vascular/Lymphatic: Dense atherosclerotic calcifications are present throughout the aorta and branch vessels without aneurysm. Reproductive: The prostate gland is enlarged, measuring up to 5.2 cm, not significantly changed. Other: Fat herniates into the left inguinal canal without bowel. Musculoskeletal: Bilateral L5 pars defects are present. Grade 1 anterolisthesis and a vacuum disc at L5-S1 are stable. Vertebral body heights and alignment are otherwise normal. The bony pelvis is intact otherwise. Review of the MIP images confirms the above findings. IMPRESSION: 1. No evidence for pulmonary embolus. 2. Mild diffuse interstitial changes and bilateral hilar adenopathy raise concern for sarcoid. No primary neoplasm is present. No focal pneumonia is present. Other interstitial disease is considered less likely. 3. Emphysema. 4. Progressive splenomegaly of unknown etiology. Early cirrhosis is not excluded in this patient with hepatic steatosis. 5. Right renal cyst. 6. Extensive atherosclerotic change without aneurysm. Coronary artery disease is present. 7. Sigmoid diverticulosis without diverticulitis. 8. Prostatic enlargement. 9. Left posterolateral bladder diverticulum without complication. Electronically Signed   By: San Morelle M.D.   On: 06/30/2016 13:45   Bufford Lope, DO 06/30/2016, 3:08 PM PGY-1, [place] [facility] pager: [phone], text pages welcome  Upper Level Addendum:  I have seen and evaluated this patient along with Dr. Shawna Orleans and reviewed the above note, making necessary revisions in green.   Phill Myron, D.O. 06/30/2016, 4:38 PM PGY-2, [place]

## 2016-06-30 NOTE — ED Notes (Signed)
Patient stable for transport at this time.  EMT will transport to 6E bed 19 after admitting is done at the bedside

## 2016-06-30 NOTE — ED Triage Notes (Signed)
Patient here with increasing weakness and fatigue x 1 week. Also complains of increasing shortness of breath with same. denies CP. On arrival patient pale with yellowish tent to chest and sclera.  Denies any history of liver disease

## 2016-06-30 NOTE — ED Provider Notes (Signed)
[place] DEPT Provider Note   CSN: [id number] Arrival date & time: 06/30/16  1010  First Provider Contact:  First MD Initiated Contact with Patient 06/30/16 1109        History   Chief Complaint Chief Complaint  Patient presents with  . Shortness of Breath  . Fatigue    HPI Richard Evans is a 70 y.o. male with a PMHx of anemia, HTN, diverticulosis, and vocal cord nodule, who presents to the ED with complaints of fatigue and shortness breath with exertion 1 week. Patient states this only occurs when he exerts himself such as going up a hill, going to his mailbox, or putting the wheel covers on his RV tires. He has noticed his urine has become darker, possibly having some hematuria, and his family has noticed that he has had gradually more yellowed skin. States that he just feels very tired and not himself. His symptoms all started 1 week after coming back from the beach, returned on 06/15/16, states that the trip was approximately 6 hours in duration, was fine for 1 week before his symptoms started. Denies any other travel.  He denies any fevers, chills, weight loss, chest pain, cough, wheezing, leg swelling, recent surgery/immobilization, family or personal history of DVT/PE, claudication, orthopnea, diaphoresis, lightheadedness, syncope, abdominal pain, nausea, vomiting, diarrhea, constipation, melena, hematochezia, malodorous urine, urinary frequency, dysuria, numbness, tingling, or focal weakness. Nonsmoker. Denies alcohol use. Family history of MI in his father when he was in his 43s. No personal hx of cardiac disease.    The history is provided by the patient and medical records. No language interpreter was used.  Shortness of Breath  This is a new problem. The problem occurs intermittently.The current episode started more than 1 week ago. The problem has not changed since onset.Pertinent negatives include no fever, no cough, no wheezing, no PND, no orthopnea, no chest pain, no  syncope, no vomiting, no abdominal pain, no leg swelling and no claudication. It is unknown what precipitated the problem. He has tried nothing for the symptoms. The treatment provided no relief. He has had no prior ED visits. He has had no prior ICU admissions. Associated medical issues do not include past MI, DVT or recent surgery.    Past Medical History:  Diagnosis Date  . History of epistaxis    hx of epistaxis on [facility]SA[facility]  . Hypertension   . Vocal cord nodule    benign    Patient Active Problem List   Diagnosis Date Noted  . ANEMIA, MILD 01/02/2011  . BLOOD IN STOOL, OCCULT 12/11/2010    Past Surgical History:  Procedure Laterality Date  . COLONOSCOPY  12/96   Dr. Rehman>diverticulosis/ external hemorrhoids       Home Medications    Prior to Admission medications   Medication Sig Start Date End Date Taking? Authorizing Provider  atenolol (TENORMIN) 25 MG tablet Take 25 mg by mouth daily.      Historical Provider, MD  diphenhydrAMINE (BENADRYL) 25 MG tablet Take 25 mg by mouth 1 dose over 24 hours.      Historical Provider, MD  Tamsulosin HCl (FLOMAX) 0.4 MG CAPS Take 0.4 mg by mouth daily.      Historical Provider, MD    Family History No family history on file.  Social History Social History  Substance Use Topics  . Smoking status: Never Smoker  . Smokeless tobacco: Never Used  . Alcohol use Not on file     Allergies  Prednisone   Review of Systems Review of Systems  Constitutional: Positive for fatigue. Negative for chills, diaphoresis, fever and unexpected weight change.  Respiratory: Positive for shortness of breath. Negative for cough and wheezing.   Cardiovascular: Negative for chest pain, orthopnea, claudication, leg swelling, syncope and PND.  Gastrointestinal: Negative for abdominal pain, anal bleeding, blood in stool, constipation, diarrhea, nausea and vomiting.  Genitourinary: Positive for hematuria (unsure,  states it's darker). Negative for dysuria and frequency.       +darker urine, ?hematuria  Musculoskeletal: Negative for arthralgias and myalgias.  Skin: Positive for color change (yellowed).  Allergic/Immunologic: Negative for immunocompromised state.  Neurological: Negative for syncope, weakness, light-headedness and numbness.  Psychiatric/Behavioral: Negative for confusion.   10 Systems reviewed and are negative for acute change except as noted in the HPI.   Physical Exam Updated Vital Signs BP 138/76   Pulse 66   Temp 98.6 F (37 C) (Oral)   Resp 20   Ht 6' (1.829 m)   Wt 86.6 kg   SpO2 97%   BMI 25.90 kg/m   Physical Exam  Constitutional: He is oriented to person, place, and time. Vital signs are normal. He appears well-developed and well-nourished.  Non-toxic appearance. No distress.  Afebrile, nontoxic, NAD  HENT:  Head: Normocephalic and atraumatic.  Mouth/Throat: Oropharynx is clear and moist and mucous membranes are normal.  Eyes: Conjunctivae and EOM are normal. Right eye exhibits no discharge. Left eye exhibits no discharge. Scleral icterus is present.  Scleral icterus  Neck: Normal range of motion. Neck supple.  Cardiovascular: Normal rate, regular rhythm, normal heart sounds and intact distal pulses.  Exam reveals no gallop and no friction rub.   No murmur heard. RRR, nl s1/s2, no m/r/g, distal pulses intact, no pedal edema   Pulmonary/Chest: Effort normal and breath sounds normal. No respiratory distress. He has no decreased breath sounds. He has no wheezes. He has no rhonchi. He has no rales.  CTAB in all lung fields, no w/r/r, no hypoxia or increased WOB, speaking in full sentences, SpO2 97% on RA   Abdominal: Soft. Normal appearance and bowel sounds are normal. He exhibits no distension. There is no tenderness. There is no rigidity, no rebound, no guarding, no CVA tenderness, no tenderness at McBurney's point and negative Murphy's sign.  Genitourinary: Rectum  normal. Rectal exam shows no external hemorrhoid, no internal hemorrhoid, no mass, no tenderness and guaiac negative stool. Prostate is enlarged. Prostate is not tender.  Genitourinary Comments: Chaperone present No gross blood noted on rectal exam, normal tone, no tenderness, no mass or fissure, no hemorrhoids. Stool yellowish. FOBT neg Prostate with mild enlargement, no warmth, tenderness, or bogginess.   Musculoskeletal: Normal range of motion.  MAE x4 Strength and sensation grossly intact Distal pulses intact Gait steady No pedal edema, neg homan's bilaterally   Neurological: He is alert and oriented to person, place, and time. He has normal strength. No sensory deficit.  Skin: Skin is warm, dry and intact. No rash noted.  Jaundiced skin  Psychiatric: He has a normal mood and affect.  Nursing note and vitals reviewed.    ED Treatments / Results  Labs (all labs ordered are listed, but only abnormal results are displayed) Labs Reviewed  PROTIME-INR - Abnormal; Notable for the following:       Result Value   Prothrombin Time 15.3 (*)    All other components within normal limits  COMPREHENSIVE METABOLIC PANEL - Abnormal; Notable for the following:  Glucose, Bld 218 (*)    Calcium 8.5 (*)    Total Bilirubin 4.7 (*)    All other components within normal limits  URINALYSIS, ROUTINE W REFLEX MICROSCOPIC (NOT AT [facility]) - Abnormal; Notable for the following:    Color, Urine AMBER (*)    Bilirubin Urine SMALL (*)    All other components within normal limits  HEPATIC FUNCTION PANEL - Abnormal; Notable for the following:    AST 52 (*)    Total Bilirubin 4.9 (*)    Bilirubin, Direct 0.6 (*)    Indirect Bilirubin 4.3 (*)    All other components within normal limits  CBC - Abnormal; Notable for the following:    WBC 14.1 (*)    RBC 2.11 (*)    Hemoglobin 7.5 (*)    HCT 22.4 (*)    MCV 106.2 (*)    MCH 35.5 (*)    RDW 23.1 (*)    All other components within normal limits  BRAIN  NATRIURETIC PEPTIDE - Abnormal; Notable for the following:    B Natriuretic Peptide 163.2 (*)    All other components within normal limits  LIPASE, BLOOD  DIFFERENTIAL  DIC (DISSEMINATED INTRAVASCULAR COAGULATION) PANEL  VITAMIN B12  FOLATE  IRON AND TIBC  FERRITIN  RETICULOCYTES  I-STAT TROPOININ, ED  POC OCCULT BLOOD, ED  TYPE AND SCREEN    EKG  EKG Interpretation  Date/Time:  Sunday June 30 2016 10:19:02 EDT Ventricular Rate:  68 PR Interval:  154 QRS Duration: 158 QT Interval:  436 QTC Calculation: 463 R Axis:   85 Text Interpretation:  Normal sinus rhythm Right bundle branch block Abnormal ECG No previous ECGs available Confirmed by Wyvonnia Dusky  MD, STEPHEN [phone]) on 06/30/2016 11:13:35 AM       Radiology Dg Chest 2 View  Result Date: 06/30/2016 CLINICAL DATA:  Shortness breath over the last week with increasing weakness and lack of energy. EXAM: CHEST  2 VIEW COMPARISON:  None. FINDINGS: The heart size is normal. Mild interstitial coarsening appears chronic. There is retraction and scarring at the lung apices bilaterally, right greater than left. This is not significantly changed. No focal airspace disease is present. There is no edema or effusion to suggest failure. The visualized soft tissues and bony thorax are unremarkable. IMPRESSION: 1. No acute cardiopulmonary disease. 2. Mild interstitial coarsening appears chronic. Electronically Signed   By: San Morelle M.D.   On: 06/30/2016 10:51  Ct Angio Chest Pe W Or Wo Contrast  Result Date: 06/30/2016 CLINICAL DATA:  Shortness breath and weakness for 1 week with progression. Painless jaundice. Shortness of breath. EXAM: CT ANGIOGRAPHY CHEST CT ABDOMEN AND PELVIS WITH CONTRAST TECHNIQUE: Multidetector CT imaging of the chest was performed using the standard protocol during bolus administration of intravenous contrast. Multiplanar CT image reconstructions and MIPs were obtained to evaluate the vascular anatomy.  Multidetector CT imaging of the abdomen and pelvis was performed using the standard protocol during bolus administration of intravenous contrast. CONTRAST:  100 mL Isovue 370 COMPARISON:  CT of the abdomen and pelvis without contrast 04/02/2012 and 07/25/2011 FINDINGS: CTA CHEST FINDINGS Mediastinum/Lymph nodes: Pulmonary arterial opacification is excellent. There are no focal filling defects to suggest pulmonary emboli. The heart size is normal. Coronary artery calcifications are present. No significant pleural or pericardial effusion is present. Bilateral hilar adenopathy is present. Right-sided nodes measure up to 16 x 10 mm. Left-sided hilar soft tissue measures up to 21 x 14 mm. Subcarinal soft tissue measures up  to 2 cm. Sub cm peritracheal nodes are present. Sub cm prevascular nodes are present. Vascular calcifications are present at the aortic arch without aneurysm or significant stenosis. Lungs/Pleura: Use interstitial prominence is noted. Extensive interlobular thickening is present. No significant reticulation is present. Emphysematous changes are noted at the lung apices, right greater than left. No significant airspace consolidation is present. There is some dependent atelectasis bilaterally. Musculoskeletal: Vertebral body heights alignment are maintained. The ribs are within normal limits. No focal lytic or blastic lesions are present. CT ABDOMEN and PELVIS FINDINGS Hepatobiliary: No focal hepatic lesions are present. There is mild diffuse fatty infiltration of the liver. The common bile duct and gallbladder are within normal limits. Pancreas: No significant inflammatory changes are present. No solid or cystic mass lesion is present. There is no ductal dilation. Spleen: Splenomegaly has progressed. No focal lesions are present. Cephalo-caudad dimension is 14.6 cm. Adrenals/Urinary tract: The adrenal glands are normal bilaterally. A 17 mm cyst is noted posteriorly in the right kidney. An exophytic cyst  laterally near the lower pole of the right kidney has slightly increased in size measuring 19 mm. No other discrete lesions are present. A punctate nonobstructing stone is again seen at the upper pole of the right kidney. The ureters are within normal limits bilaterally. A diverticulum is again noted posteriorly and laterally on the left within the urinary bladder. The urinary bladder is otherwise within normal limits. Stomach/Bowel: The stomach and duodenum are within normal limits. The small bowel is unremarkable. The appendix is visualized and normal. The cecum and ascending colon are within normal limits. The transverse colon is unremarkable. Diverticular changes are present in the descending and sigmoid colon without focal inflammation to suggest diverticulitis. The rectum is within normal limits. Vascular/Lymphatic: Dense atherosclerotic calcifications are present throughout the aorta and branch vessels without aneurysm. Reproductive: The prostate gland is enlarged, measuring up to 5.2 cm, not significantly changed. Other: Fat herniates into the left inguinal canal without bowel. Musculoskeletal: Bilateral L5 pars defects are present. Grade 1 anterolisthesis and a vacuum disc at L5-S1 are stable. Vertebral body heights and alignment are otherwise normal. The bony pelvis is intact otherwise. Review of the MIP images confirms the above findings. IMPRESSION: 1. No evidence for pulmonary embolus. 2. Mild diffuse interstitial changes and bilateral hilar adenopathy raise concern for sarcoid. No primary neoplasm is present. No focal pneumonia is present. Other interstitial disease is considered less likely. 3. Emphysema. 4. Progressive splenomegaly of unknown etiology. Early cirrhosis is not excluded in this patient with hepatic steatosis. 5. Right renal cyst. 6. Extensive atherosclerotic change without aneurysm. Coronary artery disease is present. 7. Sigmoid diverticulosis without diverticulitis. 8. Prostatic  enlargement. 9. Left posterolateral bladder diverticulum without complication. Electronically Signed   By: San Morelle M.D.   On: 06/30/2016 13:45  Ct Abdomen Pelvis W Contrast  Result Date: 06/30/2016 CLINICAL DATA:  Shortness breath and weakness for 1 week with progression. Painless jaundice. Shortness of breath. EXAM: CT ANGIOGRAPHY CHEST CT ABDOMEN AND PELVIS WITH CONTRAST TECHNIQUE: Multidetector CT imaging of the chest was performed using the standard protocol during bolus administration of intravenous contrast. Multiplanar CT image reconstructions and MIPs were obtained to evaluate the vascular anatomy. Multidetector CT imaging of the abdomen and pelvis was performed using the standard protocol during bolus administration of intravenous contrast. CONTRAST:  100 mL Isovue 370 COMPARISON:  CT of the abdomen and pelvis without contrast 04/02/2012 and 07/25/2011 FINDINGS: CTA CHEST FINDINGS Mediastinum/Lymph nodes: Pulmonary arterial opacification is excellent.  There are no focal filling defects to suggest pulmonary emboli. The heart size is normal. Coronary artery calcifications are present. No significant pleural or pericardial effusion is present. Bilateral hilar adenopathy is present. Right-sided nodes measure up to 16 x 10 mm. Left-sided hilar soft tissue measures up to 21 x 14 mm. Subcarinal soft tissue measures up to 2 cm. Sub cm peritracheal nodes are present. Sub cm prevascular nodes are present. Vascular calcifications are present at the aortic arch without aneurysm or significant stenosis. Lungs/Pleura: Use interstitial prominence is noted. Extensive interlobular thickening is present. No significant reticulation is present. Emphysematous changes are noted at the lung apices, right greater than left. No significant airspace consolidation is present. There is some dependent atelectasis bilaterally. Musculoskeletal: Vertebral body heights alignment are maintained. The ribs are within normal  limits. No focal lytic or blastic lesions are present. CT ABDOMEN and PELVIS FINDINGS Hepatobiliary: No focal hepatic lesions are present. There is mild diffuse fatty infiltration of the liver. The common bile duct and gallbladder are within normal limits. Pancreas: No significant inflammatory changes are present. No solid or cystic mass lesion is present. There is no ductal dilation. Spleen: Splenomegaly has progressed. No focal lesions are present. Cephalo-caudad dimension is 14.6 cm. Adrenals/Urinary tract: The adrenal glands are normal bilaterally. A 17 mm cyst is noted posteriorly in the right kidney. An exophytic cyst laterally near the lower pole of the right kidney has slightly increased in size measuring 19 mm. No other discrete lesions are present. A punctate nonobstructing stone is again seen at the upper pole of the right kidney. The ureters are within normal limits bilaterally. A diverticulum is again noted posteriorly and laterally on the left within the urinary bladder. The urinary bladder is otherwise within normal limits. Stomach/Bowel: The stomach and duodenum are within normal limits. The small bowel is unremarkable. The appendix is visualized and normal. The cecum and ascending colon are within normal limits. The transverse colon is unremarkable. Diverticular changes are present in the descending and sigmoid colon without focal inflammation to suggest diverticulitis. The rectum is within normal limits. Vascular/Lymphatic: Dense atherosclerotic calcifications are present throughout the aorta and branch vessels without aneurysm. Reproductive: The prostate gland is enlarged, measuring up to 5.2 cm, not significantly changed. Other: Fat herniates into the left inguinal canal without bowel. Musculoskeletal: Bilateral L5 pars defects are present. Grade 1 anterolisthesis and a vacuum disc at L5-S1 are stable. Vertebral body heights and alignment are otherwise normal. The bony pelvis is intact otherwise.  Review of the MIP images confirms the above findings. IMPRESSION: 1. No evidence for pulmonary embolus. 2. Mild diffuse interstitial changes and bilateral hilar adenopathy raise concern for sarcoid. No primary neoplasm is present. No focal pneumonia is present. Other interstitial disease is considered less likely. 3. Emphysema. 4. Progressive splenomegaly of unknown etiology. Early cirrhosis is not excluded in this patient with hepatic steatosis. 5. Right renal cyst. 6. Extensive atherosclerotic change without aneurysm. Coronary artery disease is present. 7. Sigmoid diverticulosis without diverticulitis. 8. Prostatic enlargement. 9. Left posterolateral bladder diverticulum without complication. Electronically Signed   By: San Morelle M.D.   On: 06/30/2016 13:45   Procedures Procedures (including critical care time)  Medications Ordered in ED Medications  methylPREDNISolone sodium succinate (SOLU-MEDROL) 125 mg/2 mL injection 125 mg (not administered)  iopamidol (ISOVUE-370) 76 % injection (100 mLs  Contrast Given 06/30/16 1255)     Initial Impression / Assessment and Plan / ED Course  I have reviewed the triage vital signs  and the nursing notes.  Pertinent labs & imaging results that were available during my care of the patient were reviewed by me and considered in my medical decision making (see chart for details).  Clinical Course    70 y.o. male here with 1wk of SOB with exertion, fatigue, dark urine/hematuria, painless jaundice which he noticed after he got back from the beach 2wks ago. Denies CP or SOB at rest, no hypoxia or tachycardia, lung sounds clear, no pedal edema, neg homan's bilaterally; doubt DVT or PE. Doubt need for d-dimer. Clear lung exam, no abdominal tenderness, jaundiced appearance. CXR neg for acute findings. EKG with RBBB but otherwise without acute ischemic findings. Trop neg. INR 1.2. CMP showing gluc 218 and bili 4.7, will add-on LFT panel to get direct and  indirect bili values. CBC in process. Will get lipase and U/A and BNP as well. Biggest concern is for pancreatic cancer, will get CT abd/pelvis to evaluate for this possibility. Will also get CT chest to eval for mets in the even this is cancer (and will help r/o PE possibility, although I doubt this, but since we're scanning his abdomen it's reasonable to add-on the chest as well). Discussed case with my attending Dr. Wyvonnia Dusky who agrees with plan. Will reassess shortly.   12:37 PM  LFTs show AST marginally high at 52 (odd that the CMP results don't show this), and confirms that it's mostly indirect bili that's elevated (4.3, vs direct bili 0.6 just marginally over the cut off). Lipase WNL. CBC still in process. BNP pending. U/A not yet done. CT scans not yet done. Will continue to monitor and reassess shortly.   1:17 PM  BNP slightly high 163.2, doubt this is truly CHF. CBC showing leukocytosis 14.1, will add on differential; Hgb 7.5 with elevated MCV, report states "corrected for cold agglutinins" which could be concerning in this clinical picture. U/A still not done, sitting at bedside, will have nursing staff send. CT's in process, not yet resulted. Will reassess shortly.   2:10 PM CT showing splenomegaly, no pancreatic mass; no PE. Interstitial lung disease but nothing acute in lungs. Differential not yet done. U/A in process. Will get anemia panel, DIC panel, type-and-screen, and get FOBT card done. Will consult hematology.   2:47 PM Dr. Wyvonnia Dusky discussed case with Dr. Earlie Server of hematology, recommended prednisone '1mg'$ /kg up to '100mg'$ , admit to medicine, and transfuse ONLY if Hgb <7. Pt allergic to prednisone, will give solumedrol instead. Will consult for admission. Of note, FOBT neg. U/A with bili but otherwise unremarkable.   3:08 PM Dr. Shawna Orleans of [facility] residency returning page, will admit. Please see her notes for further documentation of care. Pt stable and without current complaints at this time.    Final Clinical Impressions(s) / ED Diagnoses   Final diagnoses:  Other fatigue  SOB (shortness of breath) on exertion  Hyperglycemia  Elevated bilirubin  Jaundice  RBBB  Leukocytosis  Splenomegaly  Other hemoglobinopathies (HCC)    New Prescriptions New Prescriptions   No medications on file     [place] Camprubi-Soms, PA-C 06/30/16 [place], MD 06/30/16 1732

## 2016-06-30 NOTE — Progress Notes (Signed)
Admission note:  Arrival Method: wheelchair  Mental Orientation: alert & oriented x 4  Telemetry: box number 19 applied and verified by Carolyne Littles  Assessment: completed   Skin: intact  IV: right AC saline lock  Pain: pt denies  Tubes: N/A Safety Measures: Patient Handbook has been given, and discussed. Left at bedside  Admission: Completed and admission orders have been written  6E Orientation: Patient has been oriented to the unit, staff and to the room.  Family: [facility] [place], RN [facility] Phone [phone]

## 2016-07-01 DIAGNOSIS — D72829 Elevated white blood cell count, unspecified: Secondary | ICD-10-CM

## 2016-07-01 DIAGNOSIS — R739 Hyperglycemia, unspecified: Secondary | ICD-10-CM

## 2016-07-01 DIAGNOSIS — R161 Splenomegaly, not elsewhere classified: Secondary | ICD-10-CM

## 2016-07-01 DIAGNOSIS — R17 Unspecified jaundice: Secondary | ICD-10-CM

## 2016-07-01 LAB — HAPTOGLOBIN

## 2016-07-01 LAB — BASIC METABOLIC PANEL
ANION GAP: 5 (ref 5–15)
BUN: 19 mg/dL (ref 6–20)
CO2: 24 mmol/L (ref 22–32)
Calcium: 8.8 mg/dL — ABNORMAL LOW (ref 8.9–10.3)
Chloride: 108 mmol/L (ref 101–111)
Creatinine, Ser: 0.93 mg/dL (ref 0.61–1.24)
GFR calc Af Amer: >60 mL/min (ref >60)
Glucose, Bld: 185 mg/dL — ABNORMAL HIGH (ref 65–99)
POTASSIUM: 4.1 mmol/L (ref 3.5–5.1)
SODIUM: 137 mmol/L (ref 135–145)

## 2016-07-01 LAB — LIPID PANEL
CHOL/HDL RATIO: 4.9 ratio
CHOLESTEROL: 93 mg/dL (ref 0–200)
HDL: 19 mg/dL — ABNORMAL LOW (ref >40)
LDL Cholesterol: 0 mg/dL (ref 0–99)
Triglycerides: 371 mg/dL — ABNORMAL HIGH (ref <150)
VLDL: 74 mg/dL — ABNORMAL HIGH (ref 0–40)

## 2016-07-01 LAB — CBC
HCT: 22 % — ABNORMAL LOW (ref 39.0–52.0)
Hemoglobin: 7.6 g/dL — ABNORMAL LOW (ref 13.0–17.0)
MCH: 38.4 pg — ABNORMAL HIGH (ref 26.0–34.0)
MCHC: 34.5 g/dL (ref 30.0–36.0)
MCV: 111.1 fL — ABNORMAL HIGH (ref 78.0–100.0)
PLATELETS: 180 10*3/uL (ref 150–400)
RBC: 1.98 MIL/uL — AB (ref 4.22–5.81)
RDW: 27 % — ABNORMAL HIGH (ref 11.5–15.5)
WBC: 18.7 10*3/uL — AB (ref 4.0–10.5)

## 2016-07-01 LAB — PATHOLOGIST SMEAR REVIEW

## 2016-07-01 MED ORDER — PREDNISONE 20 MG PO TABS
80.0000 mg | ORAL_TABLET | Freq: Every day | ORAL | Status: DC
  Administered 2016-07-01 – 2016-07-04 (×4): 80 mg via ORAL
  Filled 2016-07-01 (×4): qty 1

## 2016-07-01 MED ORDER — ATORVASTATIN CALCIUM 40 MG PO TABS
40.0000 mg | ORAL_TABLET | Freq: Every day | ORAL | Status: DC
  Administered 2016-07-01 – 2016-07-03 (×3): 40 mg via ORAL
  Filled 2016-07-01 (×3): qty 1

## 2016-07-01 NOTE — Progress Notes (Signed)
Family Medicine Teaching Service Daily Progress Note Intern Pager: [phone]  Patient name: Richard Evans Medical record number: [id number] Date of birth: [birth year] Age: 70 y.o. Gender: male  Primary Care Provider: [name], MD Consultants: Hematology Code Status: FULL  Pt Overview and Major Events to Date:  7/30 Admitted for hemolytic anemia  Assessment and Plan: Richard Evans is a 70 y.o. male presenting with SOB and jaundice. PMH is significant for HTN, h/o epistaxis w/anemia, diverticulosis.   SOB in the setting of hemolytic anemia with jaundice. SOB likely 2/2 anemia. Hgb 7.9 with indirect bili 4.3 and reticulocyte count 19.9  CT abd shows splenomegaly without cirrhosis or pancreatic mass. CT chest shows b/l hilar adenopathy. LFTs and alk phos WNL. Unknown etiology: no h/o recent infections, not septic, no offending medications. Potentially an autoimmune cause or trapping of RBCs in spleen. Hgb now 7.6 - Heme/onc consulted, appreciate recomendations. - Per heme/onc, transfuse Hgb <7 and steroids: recommended prednisone 9m/kg up to 1020m Given solumedrol 12555m 1 in ED d/t intolerance of prednisone. Patient is willing to try oral prednisone again since reaction was mild. Start prednisone 62m11m. -peripheral smear ordered  - monitor H/H  HTN at home on atenolol 25mg76m. BP stable at admission.  - continue home med  BPH at home on flomax 0.4mg q87m continue home med  CAD found on CT imaging. Patient states has been on medication in the past, was taken off by PCP d/t good control.  - consider start atorvastatin 40mg q43mven risk factors - patient likely not a good candidate for daily ASA given h/o severe epistasis with ASA  - lipid panel pending - needs follow up with cardiology as outpatient  FEN/GI: heart healthy diet Prophylaxis: lovenox  Disposition: pending  Subjective:  States continues to have SOB and weakness on exertion only. No new complaints this am.  Was unaware had previous splenic enlargement on past CT.  Objective: Temp:  [97.6 F (36.4 C)-98.6 F (37 C)] 97.6 F (36.4 C) (07/31 0558) Pulse Rate:  [62-80] 66 (07/31 0558) Resp:  [11-20] 18 (07/31 0558) BP: (116-161)/(61-84) 118/66 (07/31 0558) SpO2:  [90 %-97 %] 94 % (07/31 0558) Weight:  [184 lb 1.6 oz (83.5 kg)-191 lb (86.6 kg)] 184 lb 1.6 oz (83.5 kg) (07/30 1642) Physical Exam: General: Sitting comfortably at side of bed, in NAD HEENT: scleral icterus, yellow conjunctiva, EOMI Cardiovascular: RRR, no murmurs noted Respiratory: CTAB, On room air. Normal effort Abdomen: mildly distended. Soft, nt, + bs. No rebound or guarding, appreciable splenomegaly Extremities: 2+ pedal pulses, no LE edema Skin: Appears jaundiced. 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, research (medical)nt Labs Lab 06/30/16 1200 06/30/16 1509 06/30/16 1844  WBC 14.1*  --  12.9*  HGB 7.5*  --  7.9*  HCT 22.4*  --  22.8*  PLT 169 175 164    Recent Labs Lab 06/30/16 1032 07/01/16 0440  NA 136 137  K 3.9 4.1  CL 107 108  CO2 23 24  BUN 20 19  CREATININE 1.07 0.93  CALCIUM 8.5* 8.8*  PROT 6.7  6.6  --   BILITOT 4.9*  4.7*  --   ALKPHOS 69  70  --   ALT 29  27  --   AST 52*  41  --   GLUCOSE 218* 185*    CBC  Component Value Date/Time   WBC 12.9 (H) 06/30/2016 1844   RBC 2.23 (L) 06/30/2016 1844   HGB 7.9 (L) 06/30/2016 1844   HCT 22.8 (L) 06/30/2016 1844   PLT 164 06/30/2016 1844   MCV 102.2 (H) 06/30/2016 1844   MCH 35.4 (H) 06/30/2016 1844   MCHC 34.6 06/30/2016 1844   RDW 21.8 (H) 06/30/2016 1844   LYMPHSABS 6.6 (H) 06/30/2016 1509   MONOABS 1.2 (H) 06/30/2016 1509   EOSABS 0.1 06/30/2016 1509   BASOSABS 0.0 06/30/2016 1509   Total Bilirubin 4.7 Indirect bilirubin 4.3 Direct bilirubin 0.6 Reticulocyte ct 19.9 Iron 141 TIBC 249 Ferritin 739 Ddimer  2.7 Fibrinogen 327 Vit b12 301  LDH 575 TSH 2.262 Folate 17.5 Coombs test positive  Imaging/Diagnostic Tests: Dg Chest 2 View  Result Date: 06/30/2016 CLINICAL DATA:  Shortness breath over the last week with increasing weakness and lack of energy. EXAM: CHEST  2 VIEW COMPARISON:  None. FINDINGS: The heart size is normal. Mild interstitial coarsening appears chronic. There is retraction and scarring at the lung apices bilaterally, right greater than left. This is not significantly changed. No focal airspace disease is present. There is no edema or effusion to suggest failure. The visualized soft tissues and bony thorax are unremarkable. IMPRESSION: 1. No acute cardiopulmonary disease. 2. Mild interstitial coarsening appears chronic. Electronically Signed   By: San Morelle M.D.   On: 06/30/2016 10:51  Ct Angio Chest Pe W Or Wo Contrast  Result Date: 06/30/2016 CLINICAL DATA:  Shortness breath and weakness for 1 week with progression. Painless jaundice. Shortness of breath. EXAM: CT ANGIOGRAPHY CHEST CT ABDOMEN AND PELVIS WITH CONTRAST TECHNIQUE: Multidetector CT imaging of the chest was performed using the standard protocol during bolus administration of intravenous contrast. Multiplanar CT image reconstructions and MIPs were obtained to evaluate the vascular anatomy. Multidetector CT imaging of the abdomen and pelvis was performed using the standard protocol during bolus administration of intravenous contrast. CONTRAST:  100 mL Isovue 370 COMPARISON:  CT of the abdomen and pelvis without contrast 04/02/2012 and 07/25/2011 FINDINGS: CTA CHEST FINDINGS Mediastinum/Lymph nodes: Pulmonary arterial opacification is excellent. There are no focal filling defects to suggest pulmonary emboli. The heart size is normal. Coronary artery calcifications are present. No significant pleural or pericardial effusion is present. Bilateral hilar adenopathy is present. Right-sided nodes measure up to 16 x 10 mm.  Left-sided hilar soft tissue measures up to 21 x 14 mm. Subcarinal soft tissue measures up to 2 cm. Sub cm peritracheal nodes are present. Sub cm prevascular nodes are present. Vascular calcifications are present at the aortic arch without aneurysm or significant stenosis. Lungs/Pleura: Use interstitial prominence is noted. Extensive interlobular thickening is present. No significant reticulation is present. Emphysematous changes are noted at the lung apices, right greater than left. No significant airspace consolidation is present. There is some dependent atelectasis bilaterally. Musculoskeletal: Vertebral body heights alignment are maintained. The ribs are within normal limits. No focal lytic or blastic lesions are present. CT ABDOMEN and PELVIS FINDINGS Hepatobiliary: No focal hepatic lesions are present. There is mild diffuse fatty infiltration of the liver. The common bile duct and gallbladder are within normal limits. Pancreas: No significant inflammatory changes are present. No solid or cystic mass lesion is present. There is no ductal dilation. Spleen: Splenomegaly has progressed. No focal lesions are present. Cephalo-caudad dimension is 14.6 cm. Adrenals/Urinary tract: The adrenal glands are normal bilaterally. A 17 mm cyst is noted posteriorly in the right kidney. An exophytic cyst  laterally near the lower pole of the right kidney has slightly increased in size measuring 19 mm. No other discrete lesions are present. A punctate nonobstructing stone is again seen at the upper pole of the right kidney. The ureters are within normal limits bilaterally. A diverticulum is again noted posteriorly and laterally on the left within the urinary bladder. The urinary bladder is otherwise within normal limits. Stomach/Bowel: The stomach and duodenum are within normal limits. The small bowel is unremarkable. The appendix is visualized and normal. The cecum and ascending colon are within normal limits. The transverse  colon is unremarkable. Diverticular changes are present in the descending and sigmoid colon without focal inflammation to suggest diverticulitis. The rectum is within normal limits. Vascular/Lymphatic: Dense atherosclerotic calcifications are present throughout the aorta and branch vessels without aneurysm. Reproductive: The prostate gland is enlarged, measuring up to 5.2 cm, not significantly changed. Other: Fat herniates into the left inguinal canal without bowel. Musculoskeletal: Bilateral L5 pars defects are present. Grade 1 anterolisthesis and a vacuum disc at L5-S1 are stable. Vertebral body heights and alignment are otherwise normal. The bony pelvis is intact otherwise. Review of the MIP images confirms the above findings. IMPRESSION: 1. No evidence for pulmonary embolus. 2. Mild diffuse interstitial changes and bilateral hilar adenopathy raise concern for sarcoid. No primary neoplasm is present. No focal pneumonia is present. Other interstitial disease is considered less likely. 3. Emphysema. 4. Progressive splenomegaly of unknown etiology. Early cirrhosis is not excluded in this patient with hepatic steatosis. 5. Right renal cyst. 6. Extensive atherosclerotic change without aneurysm. Coronary artery disease is present. 7. Sigmoid diverticulosis without diverticulitis. 8. Prostatic enlargement. 9. Left posterolateral bladder diverticulum without complication. Electronically Signed   By: San Morelle M.D.   On: 06/30/2016 13:45  Ct Abdomen Pelvis W Contrast  Result Date: 06/30/2016 CLINICAL DATA:  Shortness breath and weakness for 1 week with progression. Painless jaundice. Shortness of breath. EXAM: CT ANGIOGRAPHY CHEST CT ABDOMEN AND PELVIS WITH CONTRAST TECHNIQUE: Multidetector CT imaging of the chest was performed using the standard protocol during bolus administration of intravenous contrast. Multiplanar CT image reconstructions and MIPs were obtained to evaluate the vascular anatomy.  Multidetector CT imaging of the abdomen and pelvis was performed using the standard protocol during bolus administration of intravenous contrast. CONTRAST:  100 mL Isovue 370 COMPARISON:  CT of the abdomen and pelvis without contrast 04/02/2012 and 07/25/2011 FINDINGS: CTA CHEST FINDINGS Mediastinum/Lymph nodes: Pulmonary arterial opacification is excellent. There are no focal filling defects to suggest pulmonary emboli. The heart size is normal. Coronary artery calcifications are present. No significant pleural or pericardial effusion is present. Bilateral hilar adenopathy is present. Right-sided nodes measure up to 16 x 10 mm. Left-sided hilar soft tissue measures up to 21 x 14 mm. Subcarinal soft tissue measures up to 2 cm. Sub cm peritracheal nodes are present. Sub cm prevascular nodes are present. Vascular calcifications are present at the aortic arch without aneurysm or significant stenosis. Lungs/Pleura: Use interstitial prominence is noted. Extensive interlobular thickening is present. No significant reticulation is present. Emphysematous changes are noted at the lung apices, right greater than left. No significant airspace consolidation is present. There is some dependent atelectasis bilaterally. Musculoskeletal: Vertebral body heights alignment are maintained. The ribs are within normal limits. No focal lytic or blastic lesions are present. CT ABDOMEN and PELVIS FINDINGS Hepatobiliary: No focal hepatic lesions are present. There is mild diffuse fatty infiltration of the liver. The common bile duct and gallbladder are within  normal limits. Pancreas: No significant inflammatory changes are present. No solid or cystic mass lesion is present. There is no ductal dilation. Spleen: Splenomegaly has progressed. No focal lesions are present. Cephalo-caudad dimension is 14.6 cm. Adrenals/Urinary tract: The adrenal glands are normal bilaterally. A 17 mm cyst is noted posteriorly in the right kidney. An exophytic cyst  laterally near the lower pole of the right kidney has slightly increased in size measuring 19 mm. No other discrete lesions are present. A punctate nonobstructing stone is again seen at the upper pole of the right kidney. The ureters are within normal limits bilaterally. A diverticulum is again noted posteriorly and laterally on the left within the urinary bladder. The urinary bladder is otherwise within normal limits. Stomach/Bowel: The stomach and duodenum are within normal limits. The small bowel is unremarkable. The appendix is visualized and normal. The cecum and ascending colon are within normal limits. The transverse colon is unremarkable. Diverticular changes are present in the descending and sigmoid colon without focal inflammation to suggest diverticulitis. The rectum is within normal limits. Vascular/Lymphatic: Dense atherosclerotic calcifications are present throughout the aorta and branch vessels without aneurysm. Reproductive: The prostate gland is enlarged, measuring up to 5.2 cm, not significantly changed. Other: Fat herniates into the left inguinal canal without bowel. Musculoskeletal: Bilateral L5 pars defects are present. Grade 1 anterolisthesis and a vacuum disc at L5-S1 are stable. Vertebral body heights and alignment are otherwise normal. The bony pelvis is intact otherwise. Review of the MIP images confirms the above findings. IMPRESSION: 1. No evidence for pulmonary embolus. 2. Mild diffuse interstitial changes and bilateral hilar adenopathy raise concern for sarcoid. No primary neoplasm is present. No focal pneumonia is present. Other interstitial disease is considered less likely. 3. Emphysema. 4. Progressive splenomegaly of unknown etiology. Early cirrhosis is not excluded in this patient with hepatic steatosis. 5. Right renal cyst. 6. Extensive atherosclerotic change without aneurysm. Coronary artery disease is present. 7. Sigmoid diverticulosis without diverticulitis. 8. Prostatic  enlargement. 9. Left posterolateral bladder diverticulum without complication. Electronically Signed   By: San Morelle M.D.   On: 06/30/2016 13:45  Bufford Lope, DO 07/01/2016, 7:05 AM PGY-1, [place] Intern pager: [phone], text pages welcome

## 2016-07-01 NOTE — Care Management Important Message (Signed)
Important Message  Patient Details  Name: Richard Evans MRN: [id number] Date of Birth: 03-29-1946   Medicare Important Message Given:  Yes    Loann Quill 07/01/2016, 3:39 PM

## 2016-07-01 NOTE — Discharge Summary (Signed)
[facility] Discharge Summary  Patient name: Richard Evans Medical record number: [id number] Date of birth: 1946-08-12 Age: 70 y.o. Gender: male Date of Admission: 06/30/2016  Date of Discharge: 07/04/16 Admitting Physician: Zenia Resides, MD  Primary Care Provider: [name], MD Consultants: Hematology  Indication for Hospitalization: SOB 2/2 Anemia  Discharge Diagnoses/Problem List:  Hemolytic anemia HTN BPH Coronary calcification  Disposition: home  Discharge Condition: Stable, improved  Discharge Exam:  General: Sitting comfortably at side of bed, in NAD HEENT: scleral icterus, yellow conjunctiva, EOMI Cardiovascular: RRR, no murmurs noted Respiratory: CTAB, On room air. Normal effort Abdomen: mildly distended. Soft, nt, + bs. No rebound or guarding, appreciable splenomegaly Extremities: 2+ pedal pulses, no LE edema Skin: Appears jaundiced. 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 PMH is significant for HTN, h/o epistaxis w/anemia, diverticulosis.   Patient presented with 2 weeks of progressive fatigue and weakness only on exertion. States is very active at baseline, able to do heavy labor. Was found to have hemoglobin 7.9 with indirect bilirubin 4.3, reticulocyte count 19.9, Coombs test positive with LFTs and alk phos that were within normal limits. He also had a CT abd that showed splenomegaly without cirrhosis or pancreatic mass and a CT chest showing b/l hilar adenopathy. 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 Patient's hemoglobin remained stable around low 7s so hematology recommended 1 unit of pRBC [facility]  hemoglobin 8.1 and was monitored overnight before being discharged.  Issues for Follow Up:  1. Please make sure patient [facility]. Please monitor H/H due to new onset hemolytic anemia, likely autoimmune in etiology, started on prednisone 887mdaily during hospital stay.  Significant Procedures: none  Significant Labs and Imaging:   Recent Labs Lab 07/02/16 1819 07/03/16 0652 07/03/16 1550 07/04/16 0656  WBC 24.7* 27.6*  --  26.7*  HGB 7.5* 7.1* 8.1* 8.4*  HCT 22.4* 22.2* 24.5* 26.7*  PLT 206 184  --  177    Recent Labs Lab 06/30/16 1032 07/01/16 0440 07/02/16 0548  NA 136 137 138  K 3.9 4.1 3.6  CL 107 108 108  CO2 _0 GLUCOSE 218* 185* 118*  BUN 20 19 23*  CREATININE 1.07 0.93 0.98  CALCIUM 8.5* 8.8* 8.9  ALKPHOS 69  70  --   --   AST 52*  41  --   --   ALT 29  27  --   --   ALBUMIN 3.9  4.1  --   --    Lipid Panel     Component Value Date/Time   CHOL 93 07/01/2016 1237   TRIG 371 (H) 07/01/2016 1237   HDL 19 (L) 07/01/2016 1237   CHOLHDL 4.9 07/01/2016 1237   VLDL 74 (H) 07/01/2016 1237   LDLCALC 0 07/01/2016 1237    Total Bilirubin 4.7 Indirect bilirubin 4.3 Direct bilirubin 0.6 Reticulocyte ct 19.9 Haptoglobin <10 Iron 141 TIBC 249 Ferritin 739 Ddimer 2.7 Fibrinogen 327 Vit b12 301  LDH 575 TSH 2.262 Folate 17.5 Coombs test positive Peripheral smear: absolute lymphocytosis   No results found.  Results/Tests Pending at  Time of Discharge:   Discharge Medications:    Medication List    TAKE these medications   atenolol 25 MG tablet Commonly known as:  TENORMIN Take 25 mg by mouth at bedtime.   atorvastatin 40 MG tablet Commonly known as:  LIPITOR Take 1 tablet (40 mg total) by mouth daily at 6 PM.   diphenhydrAMINE 25 MG tablet Commonly known as:  BENADRYL Take 25 mg by mouth at bedtime.   FLOMAX 0.4 MG Caps capsule Generic drug:  tamsulosin Take 0.4 mg by  mouth daily after supper.   Magnesium 500 MG Tabs Take 500 mg by mouth daily as needed (FOR CRAMPING).   Potassium Gluconate 595 MG Caps Take 595 mg by mouth daily as needed (FOR CRAMPING).   predniSONE 20 MG tablet Commonly known as:  DELTASONE Take 4 tablets (80 mg total) by mouth daily with breakfast. Start taking on:  07/05/2016       Discharge Instructions: Please refer to Patient Instructions section of EMR for full details.  Patient was counseled important signs and symptoms that should prompt return to medical care, changes in medications, dietary instructions, activity restrictions, and follow up appointments.   Follow-Up Appointments: Follow-up Information    [place]. Go on 07/11/2016.   Specialty:  Oncology Why:  Appt on 07/11/16 at 1pm for hospital followup Contact information: [place] [id number] [place] [phone]       VIAS,[name], MD. Schedule an appointment as soon as possible for a visit in 1 week(s).   Specialty:  Internal Medicine Why:  hospital followup Contact information: [address] [place] [address] [place], DO 07/04/2016, 9:52 PM PGY-1, [place]

## 2016-07-02 DIAGNOSIS — D591 Other autoimmune hemolytic anemias: Principal | ICD-10-CM

## 2016-07-02 DIAGNOSIS — I10 Essential (primary) hypertension: Secondary | ICD-10-CM

## 2016-07-02 LAB — CBC
HEMATOCRIT: 21.9 % — AB (ref 39.0–52.0)
HEMATOCRIT: 21.9 % — AB (ref 39.0–52.0)
HEMATOCRIT: 22.4 % — AB (ref 39.0–52.0)
HEMOGLOBIN: 7.2 g/dL — AB (ref 13.0–17.0)
HEMOGLOBIN: 7.1 g/dL — AB (ref 13.0–17.0)
HEMOGLOBIN: 7.5 g/dL — AB (ref 13.0–17.0)
MCH: 36.2 pg — ABNORMAL HIGH (ref 26.0–34.0)
MCH: 35.9 pg — AB (ref 26.0–34.0)
MCH: 36.2 pg — AB (ref 26.0–34.0)
MCHC: 32.9 g/dL (ref 30.0–36.0)
MCHC: 32.4 g/dL (ref 30.0–36.0)
MCHC: 33.5 g/dL (ref 30.0–36.0)
MCV: 110.1 fL — AB (ref 78.0–100.0)
MCV: 110.6 fL — ABNORMAL HIGH (ref 78.0–100.0)
MCV: 108.2 fL — ABNORMAL HIGH (ref 78.0–100.0)
Platelets: 183 10*3/uL (ref 150–400)
Platelets: 187 10*3/uL (ref 150–400)
Platelets: 206 10*3/uL (ref 150–400)
RBC: 1.99 MIL/uL — AB (ref 4.22–5.81)
RBC: 1.98 MIL/uL — AB (ref 4.22–5.81)
RBC: 2.07 MIL/uL — AB (ref 4.22–5.81)
RDW: 26.2 % — AB (ref 11.5–15.5)
RDW: 26.8 % — ABNORMAL HIGH (ref 11.5–15.5)
RDW: 25.5 % — ABNORMAL HIGH (ref 11.5–15.5)
WBC: 24.2 10*3/uL — AB (ref 4.0–10.5)
WBC: 21.8 10*3/uL — ABNORMAL HIGH (ref 4.0–10.5)
WBC: 24.7 10*3/uL — ABNORMAL HIGH (ref 4.0–10.5)

## 2016-07-02 LAB — BASIC METABOLIC PANEL
ANION GAP: 4 — AB (ref 5–15)
BUN: 23 mg/dL — ABNORMAL HIGH (ref 6–20)
CHLORIDE: 108 mmol/L (ref 101–111)
CO2: 26 mmol/L (ref 22–32)
Calcium: 8.9 mg/dL (ref 8.9–10.3)
Creatinine, Ser: 0.98 mg/dL (ref 0.61–1.24)
GFR calc non Af Amer: >60 mL/min (ref >60)
GLUCOSE: 118 mg/dL — AB (ref 65–99)
POTASSIUM: 3.6 mmol/L (ref 3.5–5.1)
Sodium: 138 mmol/L (ref 135–145)

## 2016-07-02 LAB — GLUCOSE, CAPILLARY: GLUCOSE-CAPILLARY: 190 mg/dL — AB (ref 65–99)

## 2016-07-02 NOTE — Consult Note (Signed)
Reason for Referral: Hemolytic anemia.   HPI: 70 year old with history of hypertension but otherwise no significant comorbid conditions. He was in his usual state of health until the last 2 weeks where he presented with acute shortness of breath and dyspnea on exertion. He presented to the emergency department on 06/30/2016 and his evaluation at that time showed hemoglobin of 7.5, white cell count of 14.1 and platelet count of 169. His MCV is 106. His reticulocyte count was elevated. Haptoglobin was less than 10 with a positive Coombs testing. Imaging study of the chest abdomen and pelvis obtained and showed small nonspecific hilar adenopathy as well as splenomegaly. He was started on prednisone at 80 mg daily after a dose of intravenously Medrol. He reports feeling slightly better and is able to shave and ambulate short distances within his room. Leading up to the hospitalization he denied any symptoms of fevers, chills or sweats. He denies any change in his appetite or performance status. He was still able to ambulate and was driving a camper to the beach where he states for about 30 days. He denied any lymphadenopathy or petechiae.  He does not report any headaches, blurry vision, syncope or seizures. He is not report any fevers or chills or sweats or weight loss. He does not report any chest pain, palpitation, orthopnea or leg edema. He does not report any cough, wheezing or hemoptysis. He does not report any nausea, vomiting or abdominal pain. He does not report any frequency urgency or hesitancy. He has not reported any skeletal complaints. Remaining review of systems unremarkable.   Past Medical History:  Diagnosis Date  . History of epistaxis    hx of epistaxis [facility] ASA.[facility]  . Hypertension   . Vocal cord nodule    benign  :  Past Surgical History:  Procedure Laterality Date  . COLONOSCOPY  12/96   Dr. Rehman>diverticulosis/ external hemorrhoids  :   Current  Facility-Administered Medications:  .  0.9 %  sodium chloride infusion, 250 mL, Intravenous, PRN, Nicolette Bang, DO .  acetaminophen (TYLENOL) tablet 650 mg, 650 mg, Oral, Q6H PRN **OR** acetaminophen (TYLENOL) suppository 650 mg, 650 mg, Rectal, Q6H PRN, Nicolette Bang, DO .  atenolol (TENORMIN) tablet 25 mg, 25 mg, Oral, QHS, Nicolette Bang, DO, 25 mg at 07/01/16 2156 .  atorvastatin (LIPITOR) tablet 40 mg, 40 mg, Oral, q1800, Verner Mould, MD, 40 mg at 07/01/16 1826 .  diphenhydrAMINE (BENADRYL) capsule 25 mg, 25 mg, Oral, QHS, Zenia Resides, MD, 25 mg at 07/01/16 2156 .  enoxaparin (LOVENOX) injection 40 mg, 40 mg, Subcutaneous, Q24H, Nicolette Bang, DO, 40 mg at 07/01/16 1828 .  polyethylene glycol (MIRALAX / GLYCOLAX) packet 17 g, 17 g, Oral, Daily PRN, Nicolette Bang, DO .  predniSONE (DELTASONE) tablet 80 mg, 80 mg, Oral, Q breakfast, Bufford Lope, DO, 80 mg at 07/02/16 0824 .  sodium chloride flush (NS) 0.9 % injection 3 mL, 3 mL, Intravenous, Q12H, Nicolette Bang, DO, 3 mL at 07/01/16 1145 .  sodium chloride flush (NS) 0.9 % injection 3 mL, 3 mL, Intravenous, PRN, Nicolette Bang, DO .  tamsulosin [facility]) capsule 0.4 mg, 0.4 mg, Oral, QPC supper, Nicolette Bang, DO, 0.4 mg at 07/01/16 1826:  Allergies  Allergen Reactions  . Prednisone Other (See Comments)    CHEST PAINS  . Adhesive [Tape] Itching and Rash  :  Family History  Problem Relation Age of Onset  .  Brain cancer Mother   . Heart attack Father   . Brain cancer Brother   . Cancer - Lung Brother   . Stroke Brother   :  Social History   Social History  . Marital status: Single    Spouse name: N/A  . Number of children: N/A  . Years of education: N/A   Occupational History  . Not on file.   Social History Main Topics  . Smoking status: Former Smoker    Packs/day: 1.50    Years: 50.00    Types: Cigarettes    Quit  date: 11/30/2009  . Smokeless tobacco: Never Used     Comment: stopped 10 years ago  . Alcohol use No  . Drug use: No  . Sexual activity: Yes    Birth control/ protection: None   Other Topics Concern  . Not on file   Social History Narrative  . No narrative on file  :  Pertinent items are noted in HPI.  Exam: Blood pressure (!) 115/50, pulse 66, temperature 98.7 F (37.1 C), temperature source Oral, resp. rate 16, height 6' (1.829 m), weight 184 lb 8 oz (83.7 kg), SpO2 97 %. General appearance: alert and cooperative appeared without distress. Head: Normocephalic, without obvious abnormality Throat: lips, mucosa, and tongue normal; teeth and gums normal Neck: no adenopathy Back: negative Resp: clear to auscultation bilaterally Chest wall: no tenderness Cardio: regular rate and rhythm, S1, S2 normal, no murmur, click, rub or gallop GI: soft, non-tender; bowel sounds normal; no masses,  no splenomegaly palpated. Extremities: extremities normal, atraumatic, no cyanosis or edema Pulses: 2+ and symmetric Skin: Skin color, texture, turgor normal. No rashes or lesions   Recent Labs  07/02/16 0548 07/02/16 1022  WBC 24.2* 21.8*  HGB 7.2* 7.1*  HCT 21.9* 21.9*  PLT 183 187    Recent Labs  07/01/16 0440 07/02/16 0548  NA 137 138  K 4.1 3.6  CL 108 108  CO2 24 26  GLUCOSE 185* 118*  BUN 19 23*  CREATININE 0.93 0.98  CALCIUM 8.8* 8.9     Blood smear review: Showed a polychromasia with atypical lymphocytes noted.   Dg Chest 2 View  Result Date: 06/30/2016 CLINICAL DATA:  Shortness breath over the last week with increasing weakness and lack of energy. EXAM: CHEST  2 VIEW COMPARISON:  None. FINDINGS: The heart size is normal. Mild interstitial coarsening appears chronic. There is retraction and scarring at the lung apices bilaterally, right greater than left. This is not significantly changed. No focal airspace disease is present. There is no edema or effusion to  suggest failure. The visualized soft tissues and bony thorax are unremarkable. IMPRESSION: 1. No acute cardiopulmonary disease. 2. Mild interstitial coarsening appears chronic. Electronically Signed   By: San Morelle M.D.   On: 06/30/2016 10:51  Ct Angio Chest Pe W Or Wo Contrast  Result Date: 06/30/2016 CLINICAL DATA:  Shortness breath and weakness for 1 week with progression. Painless jaundice. Shortness of breath. EXAM: CT ANGIOGRAPHY CHEST CT ABDOMEN AND PELVIS WITH CONTRAST TECHNIQUE: Multidetector CT imaging of the chest was performed using the standard protocol during bolus administration of intravenous contrast. Multiplanar CT image reconstructions and MIPs were obtained to evaluate the vascular anatomy. Multidetector CT imaging of the abdomen and pelvis was performed using the standard protocol during bolus administration of intravenous contrast. CONTRAST:  100 mL Isovue 370 COMPARISON:  CT of the abdomen and pelvis without contrast 04/02/2012 and 07/25/2011 FINDINGS: CTA CHEST FINDINGS Mediastinum/Lymph  nodes: Pulmonary arterial opacification is excellent. There are no focal filling defects to suggest pulmonary emboli. The heart size is normal. Coronary artery calcifications are present. No significant pleural or pericardial effusion is present. Bilateral hilar adenopathy is present. Right-sided nodes measure up to 16 x 10 mm. Left-sided hilar soft tissue measures up to 21 x 14 mm. Subcarinal soft tissue measures up to 2 cm. Sub cm peritracheal nodes are present. Sub cm prevascular nodes are present. Vascular calcifications are present at the aortic arch without aneurysm or significant stenosis. Lungs/Pleura: Use interstitial prominence is noted. Extensive interlobular thickening is present. No significant reticulation is present. Emphysematous changes are noted at the lung apices, right greater than left. No significant airspace consolidation is present. There is some dependent atelectasis  bilaterally. Musculoskeletal: Vertebral body heights alignment are maintained. The ribs are within normal limits. No focal lytic or blastic lesions are present. CT ABDOMEN and PELVIS FINDINGS Hepatobiliary: No focal hepatic lesions are present. There is mild diffuse fatty infiltration of the liver. The common bile duct and gallbladder are within normal limits. Pancreas: No significant inflammatory changes are present. No solid or cystic mass lesion is present. There is no ductal dilation. Spleen: Splenomegaly has progressed. No focal lesions are present. Cephalo-caudad dimension is 14.6 cm. Adrenals/Urinary tract: The adrenal glands are normal bilaterally. A 17 mm cyst is noted posteriorly in the right kidney. An exophytic cyst laterally near the lower pole of the right kidney has slightly increased in size measuring 19 mm. No other discrete lesions are present. A punctate nonobstructing stone is again seen at the upper pole of the right kidney. The ureters are within normal limits bilaterally. A diverticulum is again noted posteriorly and laterally on the left within the urinary bladder. The urinary bladder is otherwise within normal limits. Stomach/Bowel: The stomach and duodenum are within normal limits. The small bowel is unremarkable. The appendix is visualized and normal. The cecum and ascending colon are within normal limits. The transverse colon is unremarkable. Diverticular changes are present in the descending and sigmoid colon without focal inflammation to suggest diverticulitis. The rectum is within normal limits. Vascular/Lymphatic: Dense atherosclerotic calcifications are present throughout the aorta and branch vessels without aneurysm. Reproductive: The prostate gland is enlarged, measuring up to 5.2 cm, not significantly changed. Other: Fat herniates into the left inguinal canal without bowel. Musculoskeletal: Bilateral L5 pars defects are present. Grade 1 anterolisthesis and a vacuum disc at L5-S1  are stable. Vertebral body heights and alignment are otherwise normal. The bony pelvis is intact otherwise. Review of the MIP images confirms the above findings. IMPRESSION: 1. No evidence for pulmonary embolus. 2. Mild diffuse interstitial changes and bilateral hilar adenopathy raise concern for sarcoid. No primary neoplasm is present. No focal pneumonia is present. Other interstitial disease is considered less likely. 3. Emphysema. 4. Progressive splenomegaly of unknown etiology. Early cirrhosis is not excluded in this patient with hepatic steatosis. 5. Right renal cyst. 6. Extensive atherosclerotic change without aneurysm. Coronary artery disease is present. 7. Sigmoid diverticulosis without diverticulitis. 8. Prostatic enlargement. 9. Left posterolateral bladder diverticulum without complication. Electronically Signed   By: San Morelle M.D.   On: 06/30/2016 13:45  Ct Abdomen Pelvis W Contrast  Result Date: 06/30/2016 CLINICAL DATA:  Shortness breath and weakness for 1 week with progression. Painless jaundice. Shortness of breath. EXAM: CT ANGIOGRAPHY CHEST CT ABDOMEN AND PELVIS WITH CONTRAST TECHNIQUE: Multidetector CT imaging of the chest was performed using the standard protocol during bolus administration of intravenous  contrast. Multiplanar CT image reconstructions and MIPs were obtained to evaluate the vascular anatomy. Multidetector CT imaging of the abdomen and pelvis was performed using the standard protocol during bolus administration of intravenous contrast. CONTRAST:  100 mL Isovue 370 COMPARISON:  CT of the abdomen and pelvis without contrast 04/02/2012 and 07/25/2011 FINDINGS: CTA CHEST FINDINGS Mediastinum/Lymph nodes: Pulmonary arterial opacification is excellent. There are no focal filling defects to suggest pulmonary emboli. The heart size is normal. Coronary artery calcifications are present. No significant pleural or pericardial effusion is present. Bilateral hilar adenopathy is  present. Right-sided nodes measure up to 16 x 10 mm. Left-sided hilar soft tissue measures up to 21 x 14 mm. Subcarinal soft tissue measures up to 2 cm. Sub cm peritracheal nodes are present. Sub cm prevascular nodes are present. Vascular calcifications are present at the aortic arch without aneurysm or significant stenosis. Lungs/Pleura: Use interstitial prominence is noted. Extensive interlobular thickening is present. No significant reticulation is present. Emphysematous changes are noted at the lung apices, right greater than left. No significant airspace consolidation is present. There is some dependent atelectasis bilaterally. Musculoskeletal: Vertebral body heights alignment are maintained. The ribs are within normal limits. No focal lytic or blastic lesions are present. CT ABDOMEN and PELVIS FINDINGS Hepatobiliary: No focal hepatic lesions are present. There is mild diffuse fatty infiltration of the liver. The common bile duct and gallbladder are within normal limits. Pancreas: No significant inflammatory changes are present. No solid or cystic mass lesion is present. There is no ductal dilation. Spleen: Splenomegaly has progressed. No focal lesions are present. Cephalo-caudad dimension is 14.6 cm. Adrenals/Urinary tract: The adrenal glands are normal bilaterally. A 17 mm cyst is noted posteriorly in the right kidney. An exophytic cyst laterally near the lower pole of the right kidney has slightly increased in size measuring 19 mm. No other discrete lesions are present. A punctate nonobstructing stone is again seen at the upper pole of the right kidney. The ureters are within normal limits bilaterally. A diverticulum is again noted posteriorly and laterally on the left within the urinary bladder. The urinary bladder is otherwise within normal limits. Stomach/Bowel: The stomach and duodenum are within normal limits. The small bowel is unremarkable. The appendix is visualized and normal. The cecum and  ascending colon are within normal limits. The transverse colon is unremarkable. Diverticular changes are present in the descending and sigmoid colon without focal inflammation to suggest diverticulitis. The rectum is within normal limits. Vascular/Lymphatic: Dense atherosclerotic calcifications are present throughout the aorta and branch vessels without aneurysm. Reproductive: The prostate gland is enlarged, measuring up to 5.2 cm, not significantly changed. Other: Fat herniates into the left inguinal canal without bowel. Musculoskeletal: Bilateral L5 pars defects are present. Grade 1 anterolisthesis and a vacuum disc at L5-S1 are stable. Vertebral body heights and alignment are otherwise normal. The bony pelvis is intact otherwise. Review of the MIP images confirms the above findings. IMPRESSION: 1. No evidence for pulmonary embolus. 2. Mild diffuse interstitial changes and bilateral hilar adenopathy raise concern for sarcoid. No primary neoplasm is present. No focal pneumonia is present. Other interstitial disease is considered less likely. 3. Emphysema. 4. Progressive splenomegaly of unknown etiology. Early cirrhosis is not excluded in this patient with hepatic steatosis. 5. Right renal cyst. 6. Extensive atherosclerotic change without aneurysm. Coronary artery disease is present. 7. Sigmoid diverticulosis without diverticulitis. 8. Prostatic enlargement. 9. Left posterolateral bladder diverticulum without complication. Electronically Signed   By: Wynetta Fines.D.  On: 06/30/2016 13:45   Assessment and Plan:   70 year old gentleman with the following issues:  1. Autoimmune hemolytic anemia: He presented with a hemoglobin of 7.5 an elevated MCV 106. His haptoglobin is less than 10 and a positive Coombs testing. His LDH is elevated at 575.  The differential diagnosis for his autoimmune hemolysis could be related to idiopathic causes versus a lymphoproliferative disorder. His CT scan findings are  nonspecific 4 clear-cut lymphoproliferative process. However, he does have hilar adenopathy and splenomegaly which could be concerning for lymphoproliferative disorder.  Our management standpoint, I agree with continuing prednisone and 80 mg daily. He would be reasonable to transfuse him at this time given the fact that he was asymptomatic at presentation and his hemoglobin have not changed at this time. Transfusion can be withheld his hemoglobin starts to rise in the next 24-48 hours.  I anticipate improvement in his hemoglobin in the next 24-48 hours.  I discussed with him other options of treatment if prednisone is ineffective which include rituximab and splenectomy.   2. Hilar adenopathy: Differential diagnosis could be related to sarcoidosis versus lymphoproliferative disorder. If he does not respond to prednisone, sampling of his hilar adenopathy would be the next step and a pulmonary consult would be warranted at this time.  3. If he is stable in the next 24-48 hours it is reasonable to discharge, and I will set up a follow-up at the [facility] early next week.

## 2016-07-02 NOTE — Progress Notes (Signed)
Family Medicine Teaching Service Daily Progress Note Intern Pager: [phone]  Patient name: Richard Evans Medical record number: [id number] Date of birth: 29-Nov-1946 Age: 70 y.o. Gender: male  Primary Care Provider: [name], MD Consultants: Hematology Code Status: FULL  Pt Overview and Major Events to Date:  7/30 Admitted for hemolytic anemia  Assessment and Plan: Richard Evans is a 70 y.o. male presenting with SOB and jaundice. PMH is significant for HTN, h/o epistaxis w/anemia, diverticulosis.   SOB in the setting of hemolytic anemia with jaundice. SOB likely 2/2 anemia. Hgb 7.9 with indirect bili 4.3 and reticulocyte count 19.9  CT abd shows splenomegaly without cirrhosis or pancreatic mass. CT chest shows b/l hilar adenopathy. LFTs and alk phos WNL. Unknown etiology: no h/o recent infections, not septic, no offending medications. Potentially an autoimmune cause or trapping of RBCs in spleen. Hgb now 7.2  - Heme/onc consulted, appreciate recomendations. - Per heme/onc, transfuse Hgb <7 and steroids: recommended prednisone 45m/kg up to 1053m Given solumedrol 12550m 1 in ED d/t intolerance of prednisone. Patient tolerating well, continue prednisone 32m52m. -peripheral smear ordered  - monitor H/H  HTN at home on atenolol 25mg56m. BP stable at admission.  - continue home med  BPH at home on flomax 0.4mg q39m continue home med  CAD found on CT imaging. Patient states has been on medication in the past, was taken off by PCP d/t good control.  - consider start atorvastatin 40mg q28mven risk factors - patient likely not a good candidate for daily ASA given h/o severe epistasis with ASA  - needs follow up with cardiology as outpatient  FEN/GI: heart healthy diet Prophylaxis: lovenox  Disposition: pending  Subjective:  States continues to have SOB and weakness on exertion only, feels mildly improved from yesterday. No new complaints this am.  Objective: Temp:  [98.3  F (36.8 C)-98.6 F (37 C)] 98.5 F (36.9 C) (08/01 0605) Pulse Rate:  [58-74] 58 (08/01 0605) Resp:  [16-18] 16 (08/01 0605) BP: (107-150)/(52-80) 125/57 (08/01 0605) SpO2:  [92 %-97 %] 93 % (08/01 0605) Weight:  [184 lb 8 oz (83.7 kg)] 184 lb 8 oz (83.7 kg) (07/31 2013) Physical Exam: General: Sitting comfortably at side of bed, in NAD HEENT: scleral icterus, yellow conjunctiva, EOMI Cardiovascular: RRR, no murmurs noted Respiratory: CTAB, On room air. Normal effort Abdomen: mildly distended. Soft, nt, + bs. No rebound or guarding, appreciable splenomegaly Extremities: 2+ pedal pulses, no LE edema Skin: Appears jaundiced. 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, research (medical)nt Labs Lab 06/30/16 1200 06/30/16 1509 06/30/16 1844 07/01/16 0440  WBC 14.1*  --  12.9* 18.7*  HGB 7.5*  --  7.9* 7.6*  HCT 22.4*  --  22.8* 22.0*  PLT 169 175 164 180    Recent Labs Lab 06/30/16 1032 07/01/16 0440  NA 136 137  K 3.9 4.1  CL 107 108  CO2 23 24  BUN 20 19  CREATININE 1.07 0.93  CALCIUM 8.5* 8.8*  PROT 6.7  6.6  --   BILITOT 4.9*  4.7*  --   ALKPHOS 69  70  --   ALT 29  27  --   AST 52*  41  --   GLUCOSE 218* 185*    CBC    Component Value Date/Time   WBC 18.7 (H) 07/01/2016 0440   RBC  1.98 (L) 07/01/2016 0440   HGB 7.6 (L) 07/01/2016 0440   HCT 22.0 (L) 07/01/2016 0440   PLT 180 07/01/2016 0440   MCV 111.1 (H) 07/01/2016 0440   MCH 38.4 (H) 07/01/2016 0440   MCHC 34.5 07/01/2016 0440   RDW 27.0 (H) 07/01/2016 0440   LYMPHSABS 6.6 (H) 06/30/2016 1509   MONOABS 1.2 (H) 06/30/2016 1509   EOSABS 0.1 06/30/2016 1509   BASOSABS 0.0 06/30/2016 1509   Total Bilirubin 4.7 Indirect bilirubin 4.3 Direct bilirubin 0.6 Reticulocyte ct 19.9 Haptoglobin <10 Iron 141 TIBC 249 Ferritin 739 Ddimer 2.7 Fibrinogen 327 Vit b12 301  LDH 575 TSH 2.262 Folate  17.5 Coombs test positive  Lipid Panel     Component Value Date/Time   CHOL 93 07/01/2016 1237   TRIG 371 (H) 07/01/2016 1237   HDL 19 (L) 07/01/2016 1237   CHOLHDL 4.9 07/01/2016 1237   VLDL 74 (H) 07/01/2016 1237   LDLCALC 0 07/01/2016 1237    Imaging/Diagnostic Tests: Dg Chest 2 View  Result Date: 06/30/2016 CLINICAL DATA:  Shortness breath over the last week with increasing weakness and lack of energy. EXAM: CHEST  2 VIEW COMPARISON:  None. FINDINGS: The heart size is normal. Mild interstitial coarsening appears chronic. There is retraction and scarring at the lung apices bilaterally, right greater than left. This is not significantly changed. No focal airspace disease is present. There is no edema or effusion to suggest failure. The visualized soft tissues and bony thorax are unremarkable. IMPRESSION: 1. No acute cardiopulmonary disease. 2. Mild interstitial coarsening appears chronic. Electronically Signed   By: San Morelle M.D.   On: 06/30/2016 10:51  Ct Angio Chest Pe W Or Wo Contrast  Result Date: 06/30/2016 CLINICAL DATA:  Shortness breath and weakness for 1 week with progression. Painless jaundice. Shortness of breath. EXAM: CT ANGIOGRAPHY CHEST CT ABDOMEN AND PELVIS WITH CONTRAST TECHNIQUE: Multidetector CT imaging of the chest was performed using the standard protocol during bolus administration of intravenous contrast. Multiplanar CT image reconstructions and MIPs were obtained to evaluate the vascular anatomy. Multidetector CT imaging of the abdomen and pelvis was performed using the standard protocol during bolus administration of intravenous contrast. CONTRAST:  100 mL Isovue 370 COMPARISON:  CT of the abdomen and pelvis without contrast 04/02/2012 and 07/25/2011 FINDINGS: CTA CHEST FINDINGS Mediastinum/Lymph nodes: Pulmonary arterial opacification is excellent. There are no focal filling defects to suggest pulmonary emboli. The heart size is normal. Coronary artery  calcifications are present. No significant pleural or pericardial effusion is present. Bilateral hilar adenopathy is present. Right-sided nodes measure up to 16 x 10 mm. Left-sided hilar soft tissue measures up to 21 x 14 mm. Subcarinal soft tissue measures up to 2 cm. Sub cm peritracheal nodes are present. Sub cm prevascular nodes are present. Vascular calcifications are present at the aortic arch without aneurysm or significant stenosis. Lungs/Pleura: Use interstitial prominence is noted. Extensive interlobular thickening is present. No significant reticulation is present. Emphysematous changes are noted at the lung apices, right greater than left. No significant airspace consolidation is present. There is some dependent atelectasis bilaterally. Musculoskeletal: Vertebral body heights alignment are maintained. The ribs are within normal limits. No focal lytic or blastic lesions are present. CT ABDOMEN and PELVIS FINDINGS Hepatobiliary: No focal hepatic lesions are present. There is mild diffuse fatty infiltration of the liver. The common bile duct and gallbladder are within normal limits. Pancreas: No significant inflammatory changes are present. No solid or cystic mass lesion is present. There  is no ductal dilation. Spleen: Splenomegaly has progressed. No focal lesions are present. Cephalo-caudad dimension is 14.6 cm. Adrenals/Urinary tract: The adrenal glands are normal bilaterally. A 17 mm cyst is noted posteriorly in the right kidney. An exophytic cyst laterally near the lower pole of the right kidney has slightly increased in size measuring 19 mm. No other discrete lesions are present. A punctate nonobstructing stone is again seen at the upper pole of the right kidney. The ureters are within normal limits bilaterally. A diverticulum is again noted posteriorly and laterally on the left within the urinary bladder. The urinary bladder is otherwise within normal limits. Stomach/Bowel: The stomach and duodenum are  within normal limits. The small bowel is unremarkable. The appendix is visualized and normal. The cecum and ascending colon are within normal limits. The transverse colon is unremarkable. Diverticular changes are present in the descending and sigmoid colon without focal inflammation to suggest diverticulitis. The rectum is within normal limits. Vascular/Lymphatic: Dense atherosclerotic calcifications are present throughout the aorta and branch vessels without aneurysm. Reproductive: The prostate gland is enlarged, measuring up to 5.2 cm, not significantly changed. Other: Fat herniates into the left inguinal canal without bowel. Musculoskeletal: Bilateral L5 pars defects are present. Grade 1 anterolisthesis and a vacuum disc at L5-S1 are stable. Vertebral body heights and alignment are otherwise normal. The bony pelvis is intact otherwise. Review of the MIP images confirms the above findings. IMPRESSION: 1. No evidence for pulmonary embolus. 2. Mild diffuse interstitial changes and bilateral hilar adenopathy raise concern for sarcoid. No primary neoplasm is present. No focal pneumonia is present. Other interstitial disease is considered less likely. 3. Emphysema. 4. Progressive splenomegaly of unknown etiology. Early cirrhosis is not excluded in this patient with hepatic steatosis. 5. Right renal cyst. 6. Extensive atherosclerotic change without aneurysm. Coronary artery disease is present. 7. Sigmoid diverticulosis without diverticulitis. 8. Prostatic enlargement. 9. Left posterolateral bladder diverticulum without complication. Electronically Signed   By: San Morelle M.D.   On: 06/30/2016 13:45  Ct Abdomen Pelvis W Contrast  Result Date: 06/30/2016 CLINICAL DATA:  Shortness breath and weakness for 1 week with progression. Painless jaundice. Shortness of breath. EXAM: CT ANGIOGRAPHY CHEST CT ABDOMEN AND PELVIS WITH CONTRAST TECHNIQUE: Multidetector CT imaging of the chest was performed using the  standard protocol during bolus administration of intravenous contrast. Multiplanar CT image reconstructions and MIPs were obtained to evaluate the vascular anatomy. Multidetector CT imaging of the abdomen and pelvis was performed using the standard protocol during bolus administration of intravenous contrast. CONTRAST:  100 mL Isovue 370 COMPARISON:  CT of the abdomen and pelvis without contrast 04/02/2012 and 07/25/2011 FINDINGS: CTA CHEST FINDINGS Mediastinum/Lymph nodes: Pulmonary arterial opacification is excellent. There are no focal filling defects to suggest pulmonary emboli. The heart size is normal. Coronary artery calcifications are present. No significant pleural or pericardial effusion is present. Bilateral hilar adenopathy is present. Right-sided nodes measure up to 16 x 10 mm. Left-sided hilar soft tissue measures up to 21 x 14 mm. Subcarinal soft tissue measures up to 2 cm. Sub cm peritracheal nodes are present. Sub cm prevascular nodes are present. Vascular calcifications are present at the aortic arch without aneurysm or significant stenosis. Lungs/Pleura: Use interstitial prominence is noted. Extensive interlobular thickening is present. No significant reticulation is present. Emphysematous changes are noted at the lung apices, right greater than left. No significant airspace consolidation is present. There is some dependent atelectasis bilaterally. Musculoskeletal: Vertebral body heights alignment are maintained. The ribs are  within normal limits. No focal lytic or blastic lesions are present. CT ABDOMEN and PELVIS FINDINGS Hepatobiliary: No focal hepatic lesions are present. There is mild diffuse fatty infiltration of the liver. The common bile duct and gallbladder are within normal limits. Pancreas: No significant inflammatory changes are present. No solid or cystic mass lesion is present. There is no ductal dilation. Spleen: Splenomegaly has progressed. No focal lesions are present.  Cephalo-caudad dimension is 14.6 cm. Adrenals/Urinary tract: The adrenal glands are normal bilaterally. A 17 mm cyst is noted posteriorly in the right kidney. An exophytic cyst laterally near the lower pole of the right kidney has slightly increased in size measuring 19 mm. No other discrete lesions are present. A punctate nonobstructing stone is again seen at the upper pole of the right kidney. The ureters are within normal limits bilaterally. A diverticulum is again noted posteriorly and laterally on the left within the urinary bladder. The urinary bladder is otherwise within normal limits. Stomach/Bowel: The stomach and duodenum are within normal limits. The small bowel is unremarkable. The appendix is visualized and normal. The cecum and ascending colon are within normal limits. The transverse colon is unremarkable. Diverticular changes are present in the descending and sigmoid colon without focal inflammation to suggest diverticulitis. The rectum is within normal limits. Vascular/Lymphatic: Dense atherosclerotic calcifications are present throughout the aorta and branch vessels without aneurysm. Reproductive: The prostate gland is enlarged, measuring up to 5.2 cm, not significantly changed. Other: Fat herniates into the left inguinal canal without bowel. Musculoskeletal: Bilateral L5 pars defects are present. Grade 1 anterolisthesis and a vacuum disc at L5-S1 are stable. Vertebral body heights and alignment are otherwise normal. The bony pelvis is intact otherwise. Review of the MIP images confirms the above findings. IMPRESSION: 1. No evidence for pulmonary embolus. 2. Mild diffuse interstitial changes and bilateral hilar adenopathy raise concern for sarcoid. No primary neoplasm is present. No focal pneumonia is present. Other interstitial disease is considered less likely. 3. Emphysema. 4. Progressive splenomegaly of unknown etiology. Early cirrhosis is not excluded in this patient with hepatic steatosis. 5.  Right renal cyst. 6. Extensive atherosclerotic change without aneurysm. Coronary artery disease is present. 7. Sigmoid diverticulosis without diverticulitis. 8. Prostatic enlargement. 9. Left posterolateral bladder diverticulum without complication. Electronically Signed   By: San Morelle M.D.   On: 06/30/2016 13:45  Bufford Lope, DO 07/02/2016, 6:58 AM PGY-1, [place] Intern pager: [phone], text pages welcome

## 2016-07-03 ENCOUNTER — Other Ambulatory Visit: Payer: Self-pay | Admitting: Oncology

## 2016-07-03 ENCOUNTER — Telehealth: Payer: Self-pay | Admitting: Oncology

## 2016-07-03 DIAGNOSIS — D591 Autoimmune hemolytic anemia, unspecified: Secondary | ICD-10-CM

## 2016-07-03 DIAGNOSIS — D598 Other acquired hemolytic anemias: Secondary | ICD-10-CM

## 2016-07-03 DIAGNOSIS — R0602 Shortness of breath: Secondary | ICD-10-CM

## 2016-07-03 DIAGNOSIS — R5383 Other fatigue: Secondary | ICD-10-CM

## 2016-07-03 LAB — HEMOGLOBIN AND HEMATOCRIT, BLOOD
HEMATOCRIT: 24.5 % — AB (ref 39.0–52.0)
HEMOGLOBIN: 8.1 g/dL — AB (ref 13.0–17.0)

## 2016-07-03 LAB — CBC
HEMATOCRIT: 22.2 % — AB (ref 39.0–52.0)
HEMOGLOBIN: 7.1 g/dL — AB (ref 13.0–17.0)
MCH: 36 pg — ABNORMAL HIGH (ref 26.0–34.0)
MCHC: 32 g/dL (ref 30.0–36.0)
MCV: 112.7 fL — AB (ref 78.0–100.0)
Platelets: 184 10*3/uL (ref 150–400)
RBC: 1.97 MIL/uL — ABNORMAL LOW (ref 4.22–5.81)
RDW: 27.6 % — AB (ref 11.5–15.5)
WBC: 27.6 10*3/uL — AB (ref 4.0–10.5)

## 2016-07-03 LAB — PREPARE RBC (CROSSMATCH)

## 2016-07-03 MED ORDER — SODIUM CHLORIDE 0.9 % IV SOLN: Freq: Once | INTRAVENOUS | Status: DC

## 2016-07-03 MED ORDER — ATORVASTATIN CALCIUM 40 MG PO TABS: 40.0000 mg | ORAL_TABLET | Freq: Every day | ORAL | 0 refills | Status: DC

## 2016-07-03 MED ORDER — PREDNISONE 20 MG PO TABS: 80.0000 mg | ORAL_TABLET | Freq: Every day | ORAL | 0 refills | Status: DC

## 2016-07-03 NOTE — Progress Notes (Signed)
Family Medicine Teaching Service Daily Progress Note Intern Pager: [phone]  Patient name: Richard Evans Medical record number: [id number] Date of birth: December 13, 1945 Age: 70 y.o. Gender: male  Primary Care Provider: [name], MD Consultants: Hematology Code Status: FULL  Pt Overview and Major Events to Date:  7/30 Admitted for hemolytic anemia  Assessment and Plan: Richard Evans is a 71 y.o. male presenting with SOB and jaundice. PMH is significant for HTN, h/o epistaxis w/anemia, diverticulosis.   SOB in the setting of hemolytic anemia with jaundice. SOB likely 2/2 anemia. 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 Indirect bili 4.3 and reticulocyte count 19.9  CT abd shows splenomegaly without cirrhosis or pancreatic mass. CT chest shows b/l hilar adenopathy. LFTs and alk phos WNL. Peripheral smear showing absolute lymphocytes Hgb now 7.1, stable since admission  - Per heme/onc, continue prednisone 65m qd. Give 1U RBC and DC today for follow up in cancer center next week with Dr SAlen Blew- monitor H/H  HTN at home on atenolol 256mqhs. BP stable at admission.  - continue home med  BPH at home on flomax 0.52m76md - continue home med  Coronary calcification found on CT imaging. Patient states has been on medication in the past, was taken off by PCP d/t good control.  - consider start atorvastatin 75m78m given risk factors but unable to calculate ASCVD with lipid results. - patient likely not a good candidate for daily ASA given h/o severe epistasis with ASA  - needs follow up with cardiology as outpatient  FEN/GI: heart healthy diet Prophylaxis: lovenox  Disposition: pending  Subjective:  States continues to have SOB and weakness on exertion only, feels mildly improved from yesterday. No new complaints this am.  Objective: Temp:  [97.9 F (36.6 C)-98.7 F (37.1 C)] 98.1 F (36.7 C) (08/02 0619) Pulse Rate:  [59-72] 59 (08/02 0619) Resp:  [16] 16 (08/02 0619) BP:  (115-125)/(50-61) 120/61 (08/02 0619) SpO2:  [91 %-97 %] 95 % (08/02 0619) Weight:  [180 lb (81.6 kg)] 180 lb (81.6 kg) (08/02 06196160ysical Exam: General: Sitting comfortably at side of bed, in NAD HEENT: scleral icterus, yellow conjunctiva, EOMI Cardiovascular: RRR, no murmurs noted Respiratory: CTAB, On room air. Normal effort Abdomen: mildly distended. Soft, nt, + bs. No rebound or guarding, appreciable splenomegaly Extremities: 2+ pedal pulses, no LE edema Skin: Appears jaundiced. 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, research (medical)ecent Labs Lab 07/02/16 0548 07/02/16 1022 07/02/16 1819  WBC 24.2* 21.8* 24.7*  HGB 7.2* 7.1* 7.5*  HCT 21.9* 21.9* 22.4*  PLT 183 187 206    Recent Labs Lab 06/30/16 1032 07/01/16 0440 07/02/16 0548  NA 136 137 138  K 3.9 4.1 3.6  CL 107 108 108  CO2 _0 BUN 20 19 23*  CREATININE 1.07 0.93 0.98  CALCIUM 8.5* 8.8* 8.9  PROT 6.7  6.6  --   --   BILITOT 4.9*  4.7*  --   --   ALKPHOS 69  70  --   --   ALT 29  27  --   --   AST 52*  41  --   --   GLUCOSE 218* 185* 118*    CBC    Component Value Date/Time   WBC 24.7 (H) 07/02/2016 1819   RBC 2.07 (L) 07/02/2016 1819   HGB 7.5 (L) 07/02/2016 1819  HCT 22.4 (L) 07/02/2016 1819   PLT 206 07/02/2016 1819   MCV 108.2 (H) 07/02/2016 1819   MCH 36.2 (H) 07/02/2016 1819   MCHC 33.5 07/02/2016 1819   RDW 25.5 (H) 07/02/2016 1819   LYMPHSABS 6.6 (H) 06/30/2016 1509   MONOABS 1.2 (H) 06/30/2016 1509   EOSABS 0.1 06/30/2016 1509   BASOSABS 0.0 06/30/2016 1509   Total Bilirubin 4.7 Indirect bilirubin 4.3 Direct bilirubin 0.6 Reticulocyte ct 19.9 Haptoglobin <10 Iron 141 TIBC 249 Ferritin 739 Ddimer 2.7 Fibrinogen 327 Vit b12 301  LDH 575 TSH 2.262 Folate 17.5 Coombs test positive  Lipid Panel     Component Value Date/Time   CHOL 93 07/01/2016 1237    TRIG 371 (H) 07/01/2016 1237   HDL 19 (L) 07/01/2016 1237   CHOLHDL 4.9 07/01/2016 1237   VLDL 74 (H) 07/01/2016 1237   LDLCALC 0 07/01/2016 1237    Imaging/Diagnostic Tests: Dg Chest 2 View  Result Date: 06/30/2016 CLINICAL DATA:  Shortness breath over the last week with increasing weakness and lack of energy. EXAM: CHEST  2 VIEW COMPARISON:  None. FINDINGS: The heart size is normal. Mild interstitial coarsening appears chronic. There is retraction and scarring at the lung apices bilaterally, right greater than left. This is not significantly changed. No focal airspace disease is present. There is no edema or effusion to suggest failure. The visualized soft tissues and bony thorax are unremarkable. IMPRESSION: 1. No acute cardiopulmonary disease. 2. Mild interstitial coarsening appears chronic. Electronically Signed   By: Marin Roberts M.D.   On: 06/30/2016 10:51  Ct Angio Chest Pe W Or Wo Contrast  Result Date: 06/30/2016 CLINICAL DATA:  Shortness breath and weakness for 1 week with progression. Painless jaundice. Shortness of breath. EXAM: CT ANGIOGRAPHY CHEST CT ABDOMEN AND PELVIS WITH CONTRAST TECHNIQUE: Multidetector CT imaging of the chest was performed using the standard protocol during bolus administration of intravenous contrast. Multiplanar CT image reconstructions and MIPs were obtained to evaluate the vascular anatomy. Multidetector CT imaging of the abdomen and pelvis was performed using the standard protocol during bolus administration of intravenous contrast. CONTRAST:  100 mL Isovue 370 COMPARISON:  CT of the abdomen and pelvis without contrast 04/02/2012 and 07/25/2011 FINDINGS: CTA CHEST FINDINGS Mediastinum/Lymph nodes: Pulmonary arterial opacification is excellent. There are no focal filling defects to suggest pulmonary emboli. The heart size is normal. Coronary artery calcifications are present. No significant pleural or pericardial effusion is present. Bilateral hilar  adenopathy is present. Right-sided nodes measure up to 16 x 10 mm. Left-sided hilar soft tissue measures up to 21 x 14 mm. Subcarinal soft tissue measures up to 2 cm. Sub cm peritracheal nodes are present. Sub cm prevascular nodes are present. Vascular calcifications are present at the aortic arch without aneurysm or significant stenosis. Lungs/Pleura: Use interstitial prominence is noted. Extensive interlobular thickening is present. No significant reticulation is present. Emphysematous changes are noted at the lung apices, right greater than left. No significant airspace consolidation is present. There is some dependent atelectasis bilaterally. Musculoskeletal: Vertebral body heights alignment are maintained. The ribs are within normal limits. No focal lytic or blastic lesions are present. CT ABDOMEN and PELVIS FINDINGS Hepatobiliary: No focal hepatic lesions are present. There is mild diffuse fatty infiltration of the liver. The common bile duct and gallbladder are within normal limits. Pancreas: No significant inflammatory changes are present. No solid or cystic mass lesion is present. There is no ductal dilation. Spleen: Splenomegaly has progressed. No focal lesions are present.  Cephalo-caudad dimension is 14.6 cm. Adrenals/Urinary tract: The adrenal glands are normal bilaterally. A 17 mm cyst is noted posteriorly in the right kidney. An exophytic cyst laterally near the lower pole of the right kidney has slightly increased in size measuring 19 mm. No other discrete lesions are present. A punctate nonobstructing stone is again seen at the upper pole of the right kidney. The ureters are within normal limits bilaterally. A diverticulum is again noted posteriorly and laterally on the left within the urinary bladder. The urinary bladder is otherwise within normal limits. Stomach/Bowel: The stomach and duodenum are within normal limits. The small bowel is unremarkable. The appendix is visualized and normal. The  cecum and ascending colon are within normal limits. The transverse colon is unremarkable. Diverticular changes are present in the descending and sigmoid colon without focal inflammation to suggest diverticulitis. The rectum is within normal limits. Vascular/Lymphatic: Dense atherosclerotic calcifications are present throughout the aorta and branch vessels without aneurysm. Reproductive: The prostate gland is enlarged, measuring up to 5.2 cm, not significantly changed. Other: Fat herniates into the left inguinal canal without bowel. Musculoskeletal: Bilateral L5 pars defects are present. Grade 1 anterolisthesis and a vacuum disc at L5-S1 are stable. Vertebral body heights and alignment are otherwise normal. The bony pelvis is intact otherwise. Review of the MIP images confirms the above findings. IMPRESSION: 1. No evidence for pulmonary embolus. 2. Mild diffuse interstitial changes and bilateral hilar adenopathy raise concern for sarcoid. No primary neoplasm is present. No focal pneumonia is present. Other interstitial disease is considered less likely. 3. Emphysema. 4. Progressive splenomegaly of unknown etiology. Early cirrhosis is not excluded in this patient with hepatic steatosis. 5. Right renal cyst. 6. Extensive atherosclerotic change without aneurysm. Coronary artery disease is present. 7. Sigmoid diverticulosis without diverticulitis. 8. Prostatic enlargement. 9. Left posterolateral bladder diverticulum without complication. Electronically Signed   By: San Morelle M.D.   On: 06/30/2016 13:45  Ct Abdomen Pelvis W Contrast  Result Date: 06/30/2016 CLINICAL DATA:  Shortness breath and weakness for 1 week with progression. Painless jaundice. Shortness of breath. EXAM: CT ANGIOGRAPHY CHEST CT ABDOMEN AND PELVIS WITH CONTRAST TECHNIQUE: Multidetector CT imaging of the chest was performed using the standard protocol during bolus administration of intravenous contrast. Multiplanar CT image  reconstructions and MIPs were obtained to evaluate the vascular anatomy. Multidetector CT imaging of the abdomen and pelvis was performed using the standard protocol during bolus administration of intravenous contrast. CONTRAST:  100 mL Isovue 370 COMPARISON:  CT of the abdomen and pelvis without contrast 04/02/2012 and 07/25/2011 FINDINGS: CTA CHEST FINDINGS Mediastinum/Lymph nodes: Pulmonary arterial opacification is excellent. There are no focal filling defects to suggest pulmonary emboli. The heart size is normal. Coronary artery calcifications are present. No significant pleural or pericardial effusion is present. Bilateral hilar adenopathy is present. Right-sided nodes measure up to 16 x 10 mm. Left-sided hilar soft tissue measures up to 21 x 14 mm. Subcarinal soft tissue measures up to 2 cm. Sub cm peritracheal nodes are present. Sub cm prevascular nodes are present. Vascular calcifications are present at the aortic arch without aneurysm or significant stenosis. Lungs/Pleura: Use interstitial prominence is noted. Extensive interlobular thickening is present. No significant reticulation is present. Emphysematous changes are noted at the lung apices, right greater than left. No significant airspace consolidation is present. There is some dependent atelectasis bilaterally. Musculoskeletal: Vertebral body heights alignment are maintained. The ribs are within normal limits. No focal lytic or blastic lesions are present. CT ABDOMEN  and PELVIS FINDINGS Hepatobiliary: No focal hepatic lesions are present. There is mild diffuse fatty infiltration of the liver. The common bile duct and gallbladder are within normal limits. Pancreas: No significant inflammatory changes are present. No solid or cystic mass lesion is present. There is no ductal dilation. Spleen: Splenomegaly has progressed. No focal lesions are present. Cephalo-caudad dimension is 14.6 cm. Adrenals/Urinary tract: The adrenal glands are normal bilaterally.  A 17 mm cyst is noted posteriorly in the right kidney. An exophytic cyst laterally near the lower pole of the right kidney has slightly increased in size measuring 19 mm. No other discrete lesions are present. A punctate nonobstructing stone is again seen at the upper pole of the right kidney. The ureters are within normal limits bilaterally. A diverticulum is again noted posteriorly and laterally on the left within the urinary bladder. The urinary bladder is otherwise within normal limits. Stomach/Bowel: The stomach and duodenum are within normal limits. The small bowel is unremarkable. The appendix is visualized and normal. The cecum and ascending colon are within normal limits. The transverse colon is unremarkable. Diverticular changes are present in the descending and sigmoid colon without focal inflammation to suggest diverticulitis. The rectum is within normal limits. Vascular/Lymphatic: Dense atherosclerotic calcifications are present throughout the aorta and branch vessels without aneurysm. Reproductive: The prostate gland is enlarged, measuring up to 5.2 cm, not significantly changed. Other: Fat herniates into the left inguinal canal without bowel. Musculoskeletal: Bilateral L5 pars defects are present. Grade 1 anterolisthesis and a vacuum disc at L5-S1 are stable. Vertebral body heights and alignment are otherwise normal. The bony pelvis is intact otherwise. Review of the MIP images confirms the above findings. IMPRESSION: 1. No evidence for pulmonary embolus. 2. Mild diffuse interstitial changes and bilateral hilar adenopathy raise concern for sarcoid. No primary neoplasm is present. No focal pneumonia is present. Other interstitial disease is considered less likely. 3. Emphysema. 4. Progressive splenomegaly of unknown etiology. Early cirrhosis is not excluded in this patient with hepatic steatosis. 5. Right renal cyst. 6. Extensive atherosclerotic change without aneurysm. Coronary artery disease is  present. 7. Sigmoid diverticulosis without diverticulitis. 8. Prostatic enlargement. 9. Left posterolateral bladder diverticulum without complication. Electronically Signed   By: San Morelle M.D.   On: 06/30/2016 13:45  Bufford Lope, DO 07/03/2016, 7:07 AM PGY-1, [facility] Intern pager: [phone], text pages welcome

## 2016-07-03 NOTE — Progress Notes (Signed)
Events in the last 12 hours noted no major changes clinically. His hemoglobin remains unchanged around 7.1.  I recommend continuing the current treatment plan and monitor his CBC periodically. There is no objection to transfusion if his hemoglobin does not improve or if he is symptomatic.  I have no objections discharge after packed red cell transfusion and will arrange follow-up upon his discharge.

## 2016-07-03 NOTE — Plan of Care (Signed)
Problem: Activity: Goal: Risk for activity intolerance will decrease Outcome: Progressing Patient able to ambulate in the room independently.  Problem: Fluid Volume: Goal: Ability to maintain a balanced intake and output will improve Outcome: Progressing Encouraged patient to void in the urinal for accurate measurements of urine output.   Problem: Nutrition: Goal: Adequate nutrition will be maintained Outcome: Completed/Met Date Met: 07/03/16 Patient with excellent appetite.

## 2016-07-03 NOTE — Progress Notes (Signed)
Patient appear to be tolerating PRBC transfusion well. I recommend observation overnight to make sure to acute hemolysis is noted.  Please repeat CBC in am and if he continues to be stable, I am ok with discharge.

## 2016-07-03 NOTE — Telephone Encounter (Signed)
per pof to sch pt appt-cld pt and left message of time & date of appt for8/10'@1'$ 

## 2016-07-03 NOTE — Progress Notes (Signed)
Given the patient is clinically stable but did have DOE prior, I recommend given him one unit of PRBC prior to discharge. He will follow up at the [facility] on 8/10 1:00.

## 2016-07-04 LAB — TYPE AND SCREEN
ABO/RH(D): O NEG
ANTIBODY SCREEN: POSITIVE
DAT, IgG: POSITIVE
Unit division: 0
Unit division: 0
Unit division: 0
Unit division: 0

## 2016-07-04 LAB — CBC
HCT: 26.7 % — ABNORMAL LOW (ref 39.0–52.0)
HEMOGLOBIN: 8.4 g/dL — AB (ref 13.0–17.0)
MCH: 34.6 pg — AB (ref 26.0–34.0)
MCHC: 31.5 g/dL (ref 30.0–36.0)
MCV: 109.9 fL — AB (ref 78.0–100.0)
Platelets: 177 10*3/uL (ref 150–400)
RBC: 2.43 MIL/uL — ABNORMAL LOW (ref 4.22–5.81)
RDW: 29.3 % — ABNORMAL HIGH (ref 11.5–15.5)
WBC: 26.7 10*3/uL — ABNORMAL HIGH (ref 4.0–10.5)

## 2016-07-04 MED ORDER — PREDNISONE 20 MG PO TABS: 80.0000 mg | ORAL_TABLET | Freq: Every day | ORAL | 0 refills | Status: DC

## 2016-07-04 NOTE — Progress Notes (Signed)
Patient to be discharged to home with spouse. PIV removed per protocol. Telemetry box #19 removed and returned to nurse's station. Discharge instructions reviewed with patient and patient's spouse. Handouts given on new medications - prednisone and atorvastatin. Prescriptions called into pharmacy by MD. Patient aware of follow up appts. All belongings returned. Family to provide transportation home.  Joellen Jersey, RN.

## 2016-07-04 NOTE — Care Management Important Message (Signed)
Important Message  Patient Details  Name: Richard Evans MRN: [id number] Date of Birth: [birth year]   Medicare Important Message Given:  Yes    Loann Quill 07/04/2016, 11:01 AM

## 2016-07-04 NOTE — Progress Notes (Signed)
Family Medicine Teaching Service Daily Progress Note Intern Pager: [phone]  Patient name: Richard Evans Medical record number: [id number] Date of birth: 05-03-1946 Age: 70 y.o. Gender: male  Primary Care Provider: [name], MD Consultants: Hematology Code Status: FULL  Pt Overview and Major Events to Date:  7/30 Admitted for hemolytic anemia  Assessment and Plan: Richard Evans a 70 y.o.malepresenting with SOB and jaundice. PMH is significant for HTN, h/o epistaxis w/anemia, diverticulosis.   SOB in the setting of hemolytic anemia with jaundice. SOB likely 2/2 anemia. 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 Indirect bili 4.3 and reticulocyte count 19.9  CT abd shows splenomegaly without cirrhosis or pancreatic mass. CT chest shows b/l hilar adenopathy. LFTs and alk phos WNL. Peripheral smear showing absolute lymphocytes Pt received 1u blood yesterday. Post-transfusion Hgb now 8.1, stable since admission  - Per heme/onc, continue prednisone 61m qd. F/u AM Hgb and DC today for follow up in cancer center next week with Dr Richard Evans- monitor H/H  HTN at home on atenolol 286mqhs. BP stable at admission.  - continue home med  BPHat home on flomax 0.4m51md - continue home med  Coronary calcification foundon CT imaging. Patient states has been on medication in the past, was taken off by PCP d/t good control.  - consider start atorvastatin 37m63m given risk factors but unable to calculate ASCVD with lipid results. - patient likely not a good candidate for daily ASA given h/o severe epistasis with ASA  - needs follow up with cardiology as outpatient  FEN/GI: heart healthy diet Prophylaxis: lovenox  Disposition: DC today with close follow up by hematology; Hgb stable s/p transfusion  Subjective:  Patient reports feeling well this morning, denies shortness of breath or light headedness.  No pain.   Objective: Temp:  [97.6 F (36.4 C)-97.9 F (36.6 C)] 97.6 F (36.4 C) (08/03  1000) Pulse Rate:  [55-72] 64 (08/03 1000) Resp:  [16-18] 16 (08/03 1000) BP: (114-152)/(64-81) 114/64 (08/03 1000) SpO2:  [93 %-99 %] 99 % (08/03 1000) Weight:  [82.3 kg (181 lb 8 oz)] 82.3 kg (181 lb 8 oz) (08/03 0524) Physical Exam: General: Chronically-ill appearing male sits comfortably in bed in no apparent distress Cardiovascular: RRR, no murmurs rubs or gallops Respiratory: clear to auscultation bilaterally with no W/R/R Abdomen: soft, nontender, non-distended Extremities: no edema, warm and well-perfused  Laboratory:  Recent Labs Lab 07/02/16 1819 07/03/16 0652 07/03/16 1550 07/04/16 0656  WBC 24.7* 27.6*  --  26.7*  HGB 7.5* 7.1* 8.1* 8.4*  HCT 22.4* 22.2* 24.5* 26.7*  PLT 206 184  --  177    Recent Labs Lab 06/30/16 1032 07/01/16 0440 07/02/16 0548  NA 136 137 138  K 3.9 4.1 3.6  CL 107 108 108  CO2 23 24 26   BUN 20 19 23*  CREATININE 1.07 0.93 0.98  CALCIUM 8.5* 8.8* 8.9  PROT 6.7  6.6  --   --   BILITOT 4.9*  4.7*  --   --   ALKPHOS 69  70  --   --   ALT 29  27  --   --   AST 52*  41  --   --   GLUCOSE 218* 185* 118*     Richard Evans 07/04/2016, 2:26 PM PGY-1, [facility] pager: [phone] pages welcome

## 2016-07-04 NOTE — Discharge Instructions (Signed)
You were admitted for hemolytic anemia. You were started on steroids during this hospital stay and given 1 unit of blood. Please continue to take the steroids (prednisone) as directed until you follow up with Hematology on 07/11/16 at 1pm.  Hemolytic Anemia Anemia is a condition in which you do not have enough red blood cells to carry oxygen throughout your body. Hemolytic anemia occurs when your red blood cells are being destroyed faster than they are being produced. Hemolytic anemia can affect people of all ages. It may worsen existing heart or lung disease. There are many types of hemolytic anemia, and they can be divided into two different groups: inherited and acquired. Inherited hemolytic anemia is due to a gene that your parents passed on to you. The abnormal cells may break down while moving through your circulatory system. Your spleen may remove the abnormal blood cells and debris from your blood stream. Acquired hemolytic anemia occurs when your red blood cells are destroyed either by certain medicines that you have used or as a result of infections or diseases that you have. CAUSES  Hemolytic anemia is caused by red blood cell destruction. Sometimes the reasons for the destruction are not clear. Known causes include:  Inherited disorders, such as sickle cell anemia and thalassemias.  Use of certain medicines.  Blood infection (septicemia).  Exposure to toxic chemicals or excessive radiation.  Reactions to blood transfusions.  Certain immune disorders.  Artificial heart valves.  Enlarged spleens. SYMPTOMS   Pale skin, eyes, and fingernails.  Irregular or fast heartbeat.  Headaches.  Tiredness (fatigue) and weakness.  Dizziness or fainting.  Shortness of breath.  Yellowing of the skin or eyes (jaundice).  Chest pain.  Cold hands and feet. DIAGNOSIS  Your health care provider will do a physical exam and ask questions about your symptoms. Blood tests, urine tests, and  taking bone marrow tissue (biopsies) may be done to help find the cause of your anemia.  TREATMENT Treatment depends on the cause of your anemia. Treatment may include:  Medicines.  Blood transfusions.  Plasmapheresis.  Blood and bone marrow stem cell transplant.  Surgery to remove the spleen. HOME CARE INSTRUCTIONS   Only take over-the-counter or prescription medicines as directed by your health care provider. If you are given antibiotics, take them as directed. Finish them even if you start to feel better.  Take over-the-counter iron supplements as directed by your health care provider.  Decrease the chances of getting sick by:  Washing your hands often.  Staying away from people who are sick.  Getting a flu shot and pneumonia shot if recommended by your health care provider.  Avoid certain kinds of foods that can expose you to bacteria, such as uncooked foods.  Keep all follow-up appointments with your health care provider. SEEK MEDICAL CARE IF:   You become dizzy or tired easily.  Your skin looks pale.  You feel your heart beating faster than normal.  You feel like your heart has skipped or stopped beats (irregular heartbeat). SEEK IMMEDIATE MEDICAL CARE IF:   Your skin and eyes turn yellow.  You develop chest pain.  You become short of breath.  You faint.  You develop an uncontrolled cough. MAKE SURE YOU:   Understand these instructions.  Will watch your condition.  Will get help right away if you are not doing well or get worse.   This information is not intended to replace advice given to you by your health care provider. Make sure you  discuss any questions you have with your health care provider.   Document Released: 11/18/2005 Document Revised: 07/21/2013 Document Reviewed: 04/07/2013 Elsevier Interactive Patient Education [employer].

## 2016-07-05 DIAGNOSIS — Z299 Encounter for prophylactic measures, unspecified: Secondary | ICD-10-CM | POA: Diagnosis not present

## 2016-07-05 DIAGNOSIS — L03113 Cellulitis of right upper limb: Secondary | ICD-10-CM | POA: Diagnosis not present

## 2016-07-05 DIAGNOSIS — D589 Hereditary hemolytic anemia, unspecified: Secondary | ICD-10-CM | POA: Diagnosis not present

## 2016-07-08 ENCOUNTER — Observation Stay (HOSPITAL_COMMUNITY)
Admission: EM | Admit: 2016-07-08 | Discharge: 2016-07-09 | Disposition: A | Discharge status: Home / Self Care | Payer: Medicare Other | Attending: Internal Medicine | Admitting: Internal Medicine

## 2016-07-08 ENCOUNTER — Encounter (HOSPITAL_COMMUNITY): Payer: Self-pay | Admitting: Emergency Medicine

## 2016-07-08 ENCOUNTER — Emergency Department (HOSPITAL_BASED_OUTPATIENT_CLINIC_OR_DEPARTMENT_OTHER)
Admit: 2016-07-08 | Discharge: 2016-07-08 | Disposition: A | Discharge status: Home / Self Care | Payer: Medicare Other | Attending: Emergency Medicine | Admitting: Emergency Medicine

## 2016-07-08 DIAGNOSIS — M7989 Other specified soft tissue disorders: Secondary | ICD-10-CM

## 2016-07-08 DIAGNOSIS — D589 Hereditary hemolytic anemia, unspecified: Secondary | ICD-10-CM | POA: Diagnosis present

## 2016-07-08 DIAGNOSIS — I82611 Acute embolism and thrombosis of superficial veins of right upper extremity: Secondary | ICD-10-CM | POA: Diagnosis not present

## 2016-07-08 DIAGNOSIS — I10 Essential (primary) hypertension: Secondary | ICD-10-CM | POA: Insufficient documentation

## 2016-07-08 DIAGNOSIS — R161 Splenomegaly, not elsewhere classified: Secondary | ICD-10-CM | POA: Diagnosis not present

## 2016-07-08 DIAGNOSIS — Z79899 Other long term (current) drug therapy: Secondary | ICD-10-CM | POA: Diagnosis not present

## 2016-07-08 DIAGNOSIS — Z87891 Personal history of nicotine dependence: Secondary | ICD-10-CM | POA: Insufficient documentation

## 2016-07-08 DIAGNOSIS — M79609 Pain in unspecified limb: Secondary | ICD-10-CM

## 2016-07-08 DIAGNOSIS — I82890 Acute embolism and thrombosis of other specified veins: Secondary | ICD-10-CM | POA: Diagnosis present

## 2016-07-08 DIAGNOSIS — K5900 Constipation, unspecified: Secondary | ICD-10-CM | POA: Diagnosis not present

## 2016-07-08 DIAGNOSIS — I809 Phlebitis and thrombophlebitis of unspecified site: Secondary | ICD-10-CM

## 2016-07-08 DIAGNOSIS — D649 Anemia, unspecified: Secondary | ICD-10-CM | POA: Diagnosis present

## 2016-07-08 DIAGNOSIS — L03113 Cellulitis of right upper limb: Secondary | ICD-10-CM | POA: Diagnosis not present

## 2016-07-08 DIAGNOSIS — E785 Hyperlipidemia, unspecified: Secondary | ICD-10-CM | POA: Diagnosis not present

## 2016-07-08 HISTORY — DX: Hereditary hemolytic anemia, unspecified: D58.9

## 2016-07-08 LAB — BASIC METABOLIC PANEL
ANION GAP: 7 (ref 5–15)
BUN: 27 mg/dL — ABNORMAL HIGH (ref 6–20)
CALCIUM: 8.6 mg/dL — AB (ref 8.9–10.3)
CHLORIDE: 107 mmol/L (ref 101–111)
CO2: 25 mmol/L (ref 22–32)
CREATININE: 1.01 mg/dL (ref 0.61–1.24)
Glucose, Bld: 204 mg/dL — ABNORMAL HIGH (ref 65–99)
POTASSIUM: 3.8 mmol/L (ref 3.5–5.1)
SODIUM: 139 mmol/L (ref 135–145)

## 2016-07-08 LAB — CBC
HCT: 28.3 % — ABNORMAL LOW (ref 39.0–52.0)
HEMOGLOBIN: 9.4 g/dL — AB (ref 13.0–17.0)
MCH: 36 pg — AB (ref 26.0–34.0)
MCHC: 33.2 g/dL (ref 30.0–36.0)
MCV: 108.4 fL — ABNORMAL HIGH (ref 78.0–100.0)
PLATELETS: 157 10*3/uL (ref 150–400)
RBC: 2.61 MIL/uL — AB (ref 4.22–5.81)
RDW: 21.6 % — ABNORMAL HIGH (ref 11.5–15.5)
WBC: 14.6 10*3/uL — AB (ref 4.0–10.5)

## 2016-07-08 LAB — I-STAT CG4 LACTIC ACID, ED: LACTIC ACID, VENOUS: 0.9 mmol/L (ref 0.5–1.9)

## 2016-07-08 LAB — PROTIME-INR
INR: 1.15
Prothrombin Time: 14.8 seconds (ref 11.4–15.2)

## 2016-07-08 MED ORDER — PIPERACILLIN-TAZOBACTAM 3.375 G IVPB
3.3750 g | Freq: Three times a day (TID) | INTRAVENOUS | Status: DC
  Administered 2016-07-08 – 2016-07-09 (×2): 3.375 g via INTRAVENOUS
  Filled 2016-07-08 (×2): qty 50

## 2016-07-08 MED ORDER — ENOXAPARIN SODIUM 40 MG/0.4ML ~~LOC~~ SOLN
40.0000 mg | SUBCUTANEOUS | Status: DC
  Administered 2016-07-08: 40 mg via SUBCUTANEOUS
  Filled 2016-07-08: qty 0.4

## 2016-07-08 MED ORDER — ONDANSETRON HCL 4 MG/2ML IJ SOLN
4.0000 mg | Freq: Four times a day (QID) | INTRAMUSCULAR | Status: DC | PRN
Start: 2016-07-08 — End: 2016-07-09

## 2016-07-08 MED ORDER — DIPHENHYDRAMINE HCL 25 MG PO CAPS
25.0000 mg | ORAL_CAPSULE | Freq: Every day | ORAL | Status: DC
  Administered 2016-07-08: 25 mg via ORAL
  Filled 2016-07-08: qty 1

## 2016-07-08 MED ORDER — VANCOMYCIN HCL IN DEXTROSE 1-5 GM/200ML-% IV SOLN
1000.0000 mg | Freq: Two times a day (BID) | INTRAVENOUS | Status: DC
  Administered 2016-07-08: 1000 mg via INTRAVENOUS
  Filled 2016-07-08: qty 200

## 2016-07-08 MED ORDER — DOCUSATE SODIUM 100 MG PO CAPS
200.0000 mg | ORAL_CAPSULE | Freq: Two times a day (BID) | ORAL | Status: DC
  Administered 2016-07-08 – 2016-07-09 (×2): 200 mg via ORAL
  Filled 2016-07-08 (×2): qty 2

## 2016-07-08 MED ORDER — ONDANSETRON HCL 4 MG/2ML IJ SOLN
4.0000 mg | Freq: Once | INTRAMUSCULAR | Status: AC
  Administered 2016-07-08: 4 mg via INTRAVENOUS
  Filled 2016-07-08: qty 2

## 2016-07-08 MED ORDER — POTASSIUM GLUCONATE 595 (99 K) MG PO TABS
595.0000 mg | ORAL_TABLET | Freq: Every day | ORAL | Status: DC | PRN
  Filled 2016-07-08: qty 1

## 2016-07-08 MED ORDER — ATORVASTATIN CALCIUM 40 MG PO TABS
40.0000 mg | ORAL_TABLET | Freq: Every day | ORAL | Status: DC
  Administered 2016-07-08: 40 mg via ORAL
  Filled 2016-07-08: qty 1

## 2016-07-08 MED ORDER — ATENOLOL 25 MG PO TABS
25.0000 mg | ORAL_TABLET | Freq: Every day | ORAL | Status: DC
  Administered 2016-07-08: 25 mg via ORAL
  Filled 2016-07-08: qty 1

## 2016-07-08 MED ORDER — PREDNISONE 20 MG PO TABS
80.0000 mg | ORAL_TABLET | Freq: Every day | ORAL | Status: DC
  Administered 2016-07-09: 80 mg via ORAL
  Filled 2016-07-08: qty 4

## 2016-07-08 MED ORDER — HYDROCODONE-ACETAMINOPHEN 5-325 MG PO TABS: 1.0000 | ORAL_TABLET | Freq: Four times a day (QID) | ORAL | Status: DC | PRN

## 2016-07-08 MED ORDER — SODIUM CHLORIDE 0.9% FLUSH
3.0000 mL | Freq: Two times a day (BID) | INTRAVENOUS | Status: DC
  Administered 2016-07-08: 3 mL via INTRAVENOUS

## 2016-07-08 MED ORDER — ONDANSETRON HCL 4 MG PO TABS: 4.0000 mg | ORAL_TABLET | Freq: Four times a day (QID) | ORAL | Status: DC | PRN

## 2016-07-08 MED ORDER — MAGNESIUM GLUCONATE 500 MG PO TABS
500.0000 mg | ORAL_TABLET | Freq: Every day | ORAL | Status: DC | PRN
  Filled 2016-07-08: qty 1

## 2016-07-08 MED ORDER — VANCOMYCIN HCL IN DEXTROSE 1-5 GM/200ML-% IV SOLN
1000.0000 mg | Freq: Once | INTRAVENOUS | Status: AC
  Administered 2016-07-08: 1000 mg via INTRAVENOUS
  Filled 2016-07-08: qty 200

## 2016-07-08 MED ORDER — SODIUM CHLORIDE 0.9 % IV SOLN
INTRAVENOUS | Status: DC
  Administered 2016-07-08: 12:00:00 via INTRAVENOUS

## 2016-07-08 MED ORDER — BISACODYL 10 MG RE SUPP: 10.0000 mg | Freq: Every day | RECTAL | Status: DC

## 2016-07-08 MED ORDER — TAMSULOSIN HCL 0.4 MG PO CAPS
0.4000 mg | ORAL_CAPSULE | Freq: Every day | ORAL | Status: DC
  Administered 2016-07-08: 0.4 mg via ORAL
  Filled 2016-07-08: qty 1

## 2016-07-08 MED ORDER — IBUPROFEN 200 MG PO TABS
600.0000 mg | ORAL_TABLET | Freq: Three times a day (TID) | ORAL | Status: DC
Start: 2016-07-08 — End: 2016-07-09
  Administered 2016-07-08 (×2): 600 mg via ORAL
  Filled 2016-07-08 (×2): qty 3

## 2016-07-08 MED ORDER — ASPIRIN EC 81 MG PO TBEC
81.0000 mg | DELAYED_RELEASE_TABLET | Freq: Every day | ORAL | Status: DC
  Administered 2016-07-08 – 2016-07-09 (×2): 81 mg via ORAL
  Filled 2016-07-08 (×2): qty 1

## 2016-07-08 MED ORDER — MORPHINE SULFATE (PF) 4 MG/ML IV SOLN
4.0000 mg | Freq: Once | INTRAVENOUS | Status: AC
  Administered 2016-07-08: 4 mg via INTRAVENOUS
  Filled 2016-07-08: qty 1

## 2016-07-08 MED ORDER — PIPERACILLIN-TAZOBACTAM 3.375 G IVPB 30 MIN
3.3750 g | Freq: Once | INTRAVENOUS | Status: AC
  Administered 2016-07-08: 3.375 g via INTRAVENOUS
  Filled 2016-07-08: qty 50

## 2016-07-08 NOTE — ED Provider Notes (Signed)
[facility] DEPT Provider Note   CSN: [id number] Arrival date & time: 07/08/16  1051  First Provider Contact:  First MD Initiated Contact with Patient 07/08/16 1200        History   Chief Complaint Chief Complaint  Patient presents with  . Arm Swelling    HPI Richard Evans is a 70 y.o. male.  Patient, with recent hospitalization for anemia, c/o redness, pain, swelling, to right upper arm that he indicates started at prior iv site right antecubital area. Symptoms constant, persistent, moderate, worse w palpation. Denies fever/chills. No numbness/weakness to arm. No nv.    The history is provided by the patient, the spouse and a relative.    Past Medical History:  Diagnosis Date  . Hemolytic anemia ([place])   . History of epistaxis    hx of epistaxis on [facility]SA[facility]  . Hypertension   . Vocal cord nodule    benign    Patient Active Problem List   Diagnosis Date Noted  . Other fatigue   . SOB (shortness of breath) on exertion   . Essential hypertension   . Elevated bilirubin   . Hyperglycemia   . Leukocytosis   . Splenomegaly   . Jaundice   . Hemolytic anemia ([place]) 06/30/2016  . Anemia 06/30/2016  . ANEMIA, MILD 01/02/2011  . BLOOD IN STOOL, OCCULT 12/11/2010    Past Surgical History:  Procedure Laterality Date  . COLONOSCOPY  12/96   Dr. Rehman>diverticulosis/ external hemorrhoids       Home Medications    Prior to Admission medications   Medication Sig Start Date End Date Taking? Authorizing Provider  atenolol (TENORMIN) 25 MG tablet Take 25 mg by mouth at bedtime.     Historical Provider, MD  atorvastatin (LIPITOR) 40 MG tablet Take 1 tablet (40 mg total) by mouth daily at 6 PM. 07/03/16   Alyssa A Haney, MD  diphenhydrAMINE (BENADRYL) 25 MG tablet Take 25 mg by mouth at bedtime.     Historical Provider, MD  Magnesium 500 MG TABS Take 500 mg by mouth daily as needed (FOR CRAMPING).    Historical Provider, MD  Potassium Gluconate  595 MG CAPS Take 595 mg by mouth daily as needed (FOR CRAMPING).    Historical Provider, MD  predniSONE (DELTASONE) 20 MG tablet Take 4 tablets (80 mg total) by mouth daily with breakfast. 07/05/16 07/11/16  Everrett Coombe, MD  Tamsulosin HCl (FLOMAX) 0.4 MG CAPS Take 0.4 mg by mouth daily after supper.     Historical Provider, MD    Family History Family History  Problem Relation Age of Onset  . Brain cancer Mother   . Heart attack Father   . Brain cancer Brother   . Cancer - Lung Brother   . Stroke Brother     Social History Social History  Substance Use Topics  . Smoking status: Former Smoker    Packs/day: 1.50    Years: 50.00    Types: Cigarettes    Quit date: 11/30/2009  . Smokeless tobacco: Never Used     Comment: stopped 10 years ago  . Alcohol use No     Allergies   Adhesive [tape] and Prednisone   Review of Systems Review of Systems  Constitutional: Negative for chills and fever.  HENT: Negative for sore throat.   Eyes: Negative for redness.  Respiratory: Negative for shortness of breath.   Cardiovascular: Negative for chest pain.  Gastrointestinal: Positive for constipation. Negative for abdominal pain.  Genitourinary: Negative for flank pain.  Musculoskeletal: Negative for back pain and neck pain.  Skin: Negative for rash.  Neurological: Negative for headaches.  Hematological: Does not bruise/bleed easily.  Psychiatric/Behavioral: Negative for confusion.     Physical Exam Updated Vital Signs BP 126/72   Pulse 64   Temp 98.6 F (37 C) (Oral)   Resp 16   SpO2 96%   Physical Exam  Constitutional: He appears well-developed and well-nourished. No distress.  HENT:  Head: Atraumatic.  Eyes: Conjunctivae are normal.  Neck: Neck supple. No tracheal deviation present.  Cardiovascular: Normal rate, regular rhythm, normal heart sounds and intact distal pulses.   Pulmonary/Chest: Effort normal. No accessory muscle usage. No respiratory distress.  Abdominal:  Soft. Bowel sounds are normal. He exhibits no distension. There is no tenderness.  Musculoskeletal: He exhibits edema.  Swelling, erythema, tenderness and increased warmth to right antecubital area extending up to mid right upper arm. No fluctuance noted. Skin indurated. No purulent drainage at prior iv site. Radial pulse 2+.  Neurological: He is alert.  Skin: Skin is warm and dry. He is not diaphoretic.  Psychiatric: He has a normal mood and affect.  Nursing note and vitals reviewed.    ED Treatments / Results  Labs (all labs ordered are listed, but only abnormal results are displayed) Results for orders placed or performed during the hospital encounter of 37/10/62  Basic metabolic panel  Result Value Ref Range   Sodium 139 135 - 145 mmol/L   Potassium 3.8 3.5 - 5.1 mmol/L   Chloride 107 101 - 111 mmol/L   CO2 25 22 - 32 mmol/L   Glucose, Bld 204 (H) 65 - 99 mg/dL   BUN 27 (H) 6 - 20 mg/dL   Creatinine, Ser 1.01 0.61 - 1.24 mg/dL   Calcium 8.6 (L) 8.9 - 10.3 mg/dL   GFR calc non Af Amer >60 >60 mL/min   GFR calc Af Amer >60 >60 mL/min   Anion gap 7 5 - 15  CBC  Result Value Ref Range   WBC 14.6 (H) 4.0 - 10.5 K/uL   RBC 2.61 (L) 4.22 - 5.81 MIL/uL   Hemoglobin 9.4 (L) 13.0 - 17.0 g/dL   HCT 28.3 (L) 39.0 - 52.0 %   MCV 108.4 (H) 78.0 - 100.0 fL   MCH 36.0 (H) 26.0 - 34.0 pg   MCHC 33.2 30.0 - 36.0 g/dL   RDW 21.6 (H) 11.5 - 15.5 %   Platelets 157 150 - 400 K/uL  I-Stat CG4 Lactic Acid, ED  Result Value Ref Range   Lactic Acid, Venous 0.90 0.5 - 1.9 mmol/L   Dg Chest 2 View  Result Date: 06/30/2016 CLINICAL DATA:  Shortness breath over the last week with increasing weakness and lack of energy. EXAM: CHEST  2 VIEW COMPARISON:  None. FINDINGS: The heart size is normal. Mild interstitial coarsening appears chronic. There is retraction and scarring at the lung apices bilaterally, right greater than left. This is not significantly changed. No focal airspace disease is present.  There is no edema or effusion to suggest failure. The visualized soft tissues and bony thorax are unremarkable. IMPRESSION: 1. No acute cardiopulmonary disease. 2. Mild interstitial coarsening appears chronic. Electronically Signed   By: San Morelle M.D.   On: 06/30/2016 10:51   EKG  EKG Interpretation None       Radiology No results found.  Procedures Procedures (including critical care time)  Medications Ordered in ED Medications  0.9 %  sodium chloride infusion ( Intravenous New Bag/Given 07/08/16 1222)     Initial Impression / Assessment and Plan / ED Course  I have reviewed the triage vital signs and the nursing notes.  Pertinent labs & imaging results that were available during my care of the patient were reviewed by me and considered in my medical decision making (see chart for details).  Clinical Course    Iv ns. Labs.  Blood cultures sent.   Iv abx given.  Vascular tech indicates no dvt, however superficial thrombophlebitis right cephalic v.     Medical service consulted for admission re cellulitis.  On review records, recent admit to [facility] - on discussion with patient, he indicates that [facility] is not his pcp, and that he prefers to stay/be admitted to [facility], esp as his hematologist is here - as such, will consult hospitalists for obs/admission.     Final Clinical Impressions(s) / ED Diagnoses   Final diagnoses:  None    New Prescriptions New Prescriptions   No medications on file     Lajean Saver, MD 07/08/16 1353

## 2016-07-08 NOTE — ED Triage Notes (Signed)
Pt hospitalized last week for hemolytic anemia. After discharge pt began to have R inner elbow redness and swelling that has gradually gotten worse. Saw PCP on Friday for this, but PCP did not want to prescribe abx due to medication interaction concerns. No SOB/CP.

## 2016-07-08 NOTE — Progress Notes (Signed)
Pharmacy Antibiotic Note  Richard Evans is a 70 y.o. male admitted on 07/08/2016 with cellulitis. Admitted last week with suspected hemolytic anemia, rec'd multiple transfusions and placed on Prednisone 80 mg/d. Now with worsening redness at former IV site Pharmacy has been consulted for Vancomycin and Zosyn dosing. Nonpurulent cellulitis and no signs of shock or end-organ damage, but patient was recently started on high-dose steroids.  Plan:  Vancomycin 1000 mg IV now, then 1000 mg IV q12 hr; goal trough 10-15 mcg/mL  Measure vancomycin trough levels at steady state as indicated  Zosyn 3.375 g IV given once over 30 minutes, then every 8 hrs by 4-hr infusion Follow clinical course, renal function, culture results as available Follow for de-escalation of antibiotics and LOT     Temp (24hrs), Avg:98.6 F (37 C), Min:98.6 F (37 C), Max:98.6 F (37 C)   Recent Labs Lab 07/02/16 0548 07/02/16 1022 07/02/16 1819 07/03/16 0652 07/04/16 0656 07/08/16 1203 07/08/16 1234  WBC 24.2* 21.8* 24.7* 27.6* 26.7* 14.6*  --   CREATININE 0.98  --   --   --   --  1.01  --   LATICACIDVEN  --   --   --   --   --   --  0.90    Estimated Creatinine Clearance: 73.6 mL/min (by C-G formula based on SCr of 1.01 mg/dL).    Allergies  Allergen Reactions  . Adhesive [Tape] Itching and Rash  . Prednisone Other (See Comments)    Chest tightness in past. Tolerated solumedrol well, so retried d/t medical necessity. Tolerating well during inpatient stay in August 2017    Antimicrobials this admission: Vancomycin 8/7 >>  Zosyn 8/7 >>   Dose adjustments this admission: ---  Microbiology results: 8/7 BCx: sent  Thank you for allowing pharmacy to be a part of this patient's care.  Reuel Boom, PharmD, BCPS Pager: [phone] 07/08/2016, 2:02 PM

## 2016-07-08 NOTE — H&P (Signed)
[facility] H&P   Patient Demographics:    Cason Luffman, is a 70 y.o. male  MRN: [id number]   DOB - Nov 02, 1946  Admit Date - 07/08/2016  Outpatient Primary MD for the patient is [name], MD  Outpatient Specialists: Dr. Alen Blew  Patient coming from: Home  Chief Complaint  Patient presents with  . Arm Swelling      HPI:    Yesenia Fontenette  is a 70 y.o. male,  Recent diagnosis of hemolytic anemia was discharged on prednisone few days ago, hypertension, dyslipidemia who was doing fairly well after discharge from the hospital however he started noticing that his right arm was getting more swollen at the previous IV site or day after discharge, it continued to bother him with some pain and throbbing along with warmth in that area so he came to the ER. In the ER ultrasound showed cephalic vein clot in the right upper extremity with minimal surrounding cellulitis and I was called to admit the patient. Besides above dictated review of systems his only other positive review of systems constipation.    Review of systems:    In addition to the HPI above,   No Fever-chills, No Headache, No changes with Vision or hearing, No problems swallowing food or Liquids, No Chest pain, Cough or Shortness of Breath, No Abdominal pain, No Nausea or Vommitting, He feels constipated, No Blood in stool or Urine, No dysuria, No new skin rashes or bruises, No new joints pains-aches,  No new weakness, tingling, numbness in any extremity, except right upper extremity pain as above, No recent weight gain or loss, No polyuria, polydypsia or polyphagia, No significant Mental Stressors.  A full 10 point Review of Systems was done, except as stated  above, all other Review of Systems were negative.   With Past History of the following :    Past Medical History:  Diagnosis Date  . Hemolytic anemia ([place])   . History of epistaxis    hx of epistaxis on ASA.Marland [facility]required [facility]  . Hypertension   . Vocal cord nodule    benign      Past Surgical History:  Procedure Laterality Date  . COLONOSCOPY  12/96   Dr. Rehman>diverticulosis/ external hemorrhoids      Social History:     Social History  Substance Use Topics  .  Smoking status: Former Smoker    Packs/day: 1.50    Years: 50.00    Types: Cigarettes    Quit date: 11/30/2009  . Smokeless tobacco: Never Used     Comment: stopped 10 years ago  . Alcohol use No         Family History :     Family History  Problem Relation Age of Onset  . Brain cancer Mother   . Heart attack Father   . Brain cancer Brother   . Cancer - Lung Brother   . Stroke Brother        Home Medications:   Prior to Admission medications   Medication Sig Start Date End Date Taking? Authorizing Provider  atenolol (TENORMIN) 25 MG tablet Take 25 mg by mouth at bedtime.    Yes Historical Provider, MD  atorvastatin (LIPITOR) 40 MG tablet Take 1 tablet (40 mg total) by mouth daily at 6 PM. 07/03/16  Yes Alyssa A Haney, MD  diphenhydrAMINE (BENADRYL) 25 MG tablet Take 25 mg by mouth at bedtime.    Yes Historical Provider, MD  Magnesium 500 MG TABS Take 500 mg by mouth daily as needed (FOR CRAMPING).   Yes Historical Provider, MD  Potassium Gluconate 595 MG CAPS Take 595 mg by mouth daily as needed (FOR CRAMPING).   Yes Historical Provider, MD  predniSONE (DELTASONE) 20 MG tablet Take 4 tablets (80 mg total) by mouth daily with breakfast. 07/05/16 07/11/16 Yes Everrett Coombe, MD  Tamsulosin HCl (FLOMAX) 0.4 MG CAPS Take 0.4 mg by mouth daily after supper.    Yes Historical Provider, MD     Allergies:     Allergies  Allergen Reactions  . Adhesive [Tape] Itching and Rash  . Prednisone  Other (See Comments)    Chest tightness in past. Tolerated solumedrol well, so retried d/t medical necessity. Tolerating well during inpatient stay in August 2017     Physical Exam:   Vitals  Blood pressure 145/67, pulse 66, temperature 98.6 F (37 C), temperature source Oral, resp. rate 16, SpO2 92 %.   1. General Pleasant elderly white male lying in bed in NAD,   2. Normal affect and insight, Not Suicidal or Homicidal, Awake Alert, Oriented X 3.  3. No F.N deficits, ALL C.Nerves Intact, Strength 5/5 all 4 extremities, Sensation intact all 4 extremities, Plantars down going.  4. Ears and Eyes appear Normal, Conjunctivae clear, PERRLA. Moist Oral Mucosa.  5. Supple Neck, No JVD, No cervical lymphadenopathy appriciated, No Carotid Bruits.  6. Symmetrical Chest wall movement, Good air movement bilaterally, CTAB.  7. RRR, No Gallops, Rubs or Murmurs, No Parasternal Heave.  8. Positive Bowel Sounds, Abdomen Soft, No tenderness, No organomegaly appriciated,No rebound -guarding or rigidity.  9.  No Cyanosis, Normal Skin Turgor, No Skin Rash or Bruise. Right antecubital fossa has small area of redness and swelling, minimal warmth  10. Good muscle tone,  joints appear normal , no effusions, Normal ROM.  11. No Palpable Lymph Nodes in Neck or Axillae      Data Review:    CBC  Recent Labs Lab 07/02/16 1022 07/02/16 1819 07/03/16 0652 07/03/16 1550 07/04/16 0656 07/08/16 1203  WBC 21.8* 24.7* 27.6*  --  26.7* 14.6*  HGB 7.1* 7.5* 7.1* 8.1* 8.4* 9.4*  HCT 21.9* 22.4* 22.2* 24.5* 26.7* 28.3*  PLT 187 206 184  --  177 157  MCV 110.6* 108.2* 112.7*  --  109.9* 108.4*  MCH 35.9* 36.2* 36.0*  --  34.6*  36.0*  MCHC 32.4 33.5 32.0  --  31.5 33.2  RDW 26.8* 25.5* 27.6*  --  29.3* 21.6*   ------------------------------------------------------------------------------------------------------------------  Chemistries   Recent Labs Lab 07/02/16 0548 07/08/16 1203  NA 138 139    K 3.6 3.8  CL 108 107  CO2 26 25  GLUCOSE 118* 204*  BUN 23* 27*  CREATININE 0.98 1.01  CALCIUM 8.9 8.6*   ------------------------------------------------------------------------------------------------------------------ estimated creatinine clearance is 73.6 mL/min (by C-G formula based on SCr of 1.01 mg/dL). ------------------------------------------------------------------------------------------------------------------ No results for input(s): TSH, T4TOTAL, T3FREE, THYROIDAB in the last 72 hours.  Invalid input(s): FREET3  Coagulation profile No results for input(s): INR, PROTIME in the last 168 hours. ------------------------------------------------------------------------------------------------------------------- No results for input(s): DDIMER in the last 72 hours. -------------------------------------------------------------------------------------------------------------------  Cardiac Enzymes No results for input(s): CKMB, TROPONINI, MYOGLOBIN in the last 168 hours.  Invalid input(s): CK ------------------------------------------------------------------------------------------------------------------    Component Value Date/Time   BNP 163.2 (H) 06/30/2016 1200     ---------------------------------------------------------------------------------------------------------------  Urinalysis    Component Value Date/Time   COLORURINE AMBER (A) 06/30/2016 1406   APPEARANCEUR CLEAR 06/30/2016 1406   LABSPEC 1.018 06/30/2016 1406   PHURINE 5.5 06/30/2016 1406   GLUCOSEU NEGATIVE 06/30/2016 1406   HGBUR NEGATIVE 06/30/2016 1406   BILIRUBINUR SMALL (A) 06/30/2016 1406   KETONESUR NEGATIVE 06/30/2016 1406   PROTEINUR NEGATIVE 06/30/2016 1406   NITRITE NEGATIVE 06/30/2016 1406   LEUKOCYTESUR NEGATIVE 06/30/2016 1406    ----------------------------------------------------------------------------------------------------------------   Imaging Results:    No results  found.     Assessment & Plan:      1. Right upper extremity cephalic vein clot. Discussed the case with vascular surgeon on call Dr. early, warm compress, elevate right arm, NSAIDs a few doses, then discharge.  2. Possible right upper extremity cellulitis. On exam less likely, his leukocytosis is likely due to thrombophlebitis, treatment as above, he has been given IV antibiotics for cellulitis in the ER which I will continue for 24 hours thereafter we'll discharge him on oral doxycycline for 2-3 days only.  3. Recent diagnosis of hemolytic anemia. H&H stable. Continue prednisone which was started. Zocor thereafter follow with his primary oncologist Dr. Alen Blew.  4. Dyslipidemia. Continue home medications.  5. Essential hypertension. Home regimen continued.  6. Constipation. Placed on bowel regimen.   DVT Prophylaxis Lovenox   AM Labs Ordered, also please review Full Orders  Family Communication: Admission, patients condition and plan of care including tests being ordered have been discussed with the patient and family who indicate understanding and agree with the plan and Code Status.  Code Status Full  Likely DC to Home in 1-2 days  Condition fair  Consults called: Dr early VVS over the phone    Admission status: Obs  Time spent in minutes : 35   Aries Townley K M.D on 07/08/2016 at 3:37 PM  Between 7am to 7pm - Pager - [phone]. After 7pm go to [email or web address] - password [facility]  [facility] - Office  [phone]

## 2016-07-08 NOTE — Progress Notes (Signed)
*  Preliminary Results* Right upper extremity venous duplex completed. Right upper extremity is negative for deep and vein thrombosis. There is evidence of superficial thrombus involving the right cephalic vein above the antecubital fossa, extending to the mid forearm.  Preliminary results discussed with Dr. Ashok Cordia.  07/08/2016 1:27 PM  Maudry Mayhew, B.S., RVT, RDCS, RDMS

## 2016-07-08 NOTE — Plan of Care (Signed)
Problem: Safety: Goal: Ability to remain free from injury will improve Outcome: Completed/Met Date Met: 07/08/16 Pt made aware of safety plan and prevention measures. Pt is agreeable

## 2016-07-09 DIAGNOSIS — I82611 Acute embolism and thrombosis of superficial veins of right upper extremity: Secondary | ICD-10-CM | POA: Diagnosis not present

## 2016-07-09 DIAGNOSIS — L03113 Cellulitis of right upper limb: Secondary | ICD-10-CM | POA: Diagnosis not present

## 2016-07-09 LAB — BASIC METABOLIC PANEL
Anion gap: 6 (ref 5–15)
BUN: 26 mg/dL — AB (ref 6–20)
CHLORIDE: 106 mmol/L (ref 101–111)
CO2: 25 mmol/L (ref 22–32)
Calcium: 8.3 mg/dL — ABNORMAL LOW (ref 8.9–10.3)
Creatinine, Ser: 1.03 mg/dL (ref 0.61–1.24)
Glucose, Bld: 222 mg/dL — ABNORMAL HIGH (ref 65–99)
POTASSIUM: 3.9 mmol/L (ref 3.5–5.1)
SODIUM: 137 mmol/L (ref 135–145)

## 2016-07-09 LAB — CBC
HEMATOCRIT: 23.4 % — AB (ref 39.0–52.0)
Hemoglobin: 7.7 g/dL — ABNORMAL LOW (ref 13.0–17.0)
MCH: 35.3 pg — ABNORMAL HIGH (ref 26.0–34.0)
MCHC: 32.9 g/dL (ref 30.0–36.0)
MCV: 107.3 fL — AB (ref 78.0–100.0)
PLATELETS: 138 10*3/uL — AB (ref 150–400)
RBC: 2.18 MIL/uL — AB (ref 4.22–5.81)
RDW: 20.6 % — AB (ref 11.5–15.5)
WBC: 10.3 10*3/uL (ref 4.0–10.5)

## 2016-07-09 LAB — SAMPLE TO BLOOD BANK

## 2016-07-09 LAB — HEMOGLOBIN AND HEMATOCRIT, BLOOD
HEMATOCRIT: 23.1 % — AB (ref 39.0–52.0)
Hemoglobin: 7.9 g/dL — ABNORMAL LOW (ref 13.0–17.0)

## 2016-07-09 MED ORDER — CEPHALEXIN 500 MG PO CAPS: 500.0000 mg | ORAL_CAPSULE | Freq: Three times a day (TID) | ORAL | 0 refills | Status: DC

## 2016-07-09 MED ORDER — CEPHALEXIN 500 MG PO CAPS: 500.0000 mg | ORAL_CAPSULE | Freq: Three times a day (TID) | ORAL | Status: DC

## 2016-07-09 MED ORDER — ASPIRIN 81 MG PO TBEC: 81.0000 mg | DELAYED_RELEASE_TABLET | Freq: Every day | ORAL | 0 refills | Status: DC

## 2016-07-09 NOTE — Discharge Summary (Addendum)
DAT DERKSEN DJT:[id number] DOB: [birth year] DOA: 07/08/2016  PCP: [name], MD  Admit date: 07/08/2016  Discharge date: 07/09/2016  Admitted From: Home   Disposition:  Home   Recommendations for Outpatient Follow-up:   Follow up with PCP in 1-2 weeks  PCP Please obtain BMP/CBC, 2 view CXR in 1week,  (see Discharge instructions)   PCP Please follow up on the following pending results: None   Home Health: None   Equipment/Devices: None  Consultations: None Discharge Condition: Stable CODE STATUS: Full   Diet Recommendation: Heart Healthy -Low Carb   Chief Complaint  Patient presents with  . Arm Swelling     Brief history of present illness from the day of admission and additional interim summary    Richard Evans  is a 70 y.o. male,  Recent diagnosis of hemolytic anemia was discharged on prednisone few days ago, hypertension, dyslipidemia who was doing fairly well after discharge from the hospital however he started noticing that his right arm was getting more swollen at the previous IV site or day after discharge, it continued to bother him with some pain and throbbing along with warmth in that area so he came to the ER. In the ER ultrasound showed cephalic vein clot in the right upper extremity with minimal surrounding cellulitis and I was called to admit the patient. Besides above dictated review of systems his only other positive review of systems constipation.  Hospital issues addressed    1. Right upper extremity cephalic vein clot. Discussed the case with vascular surgeon on call Dr. Donnetta Hutching, warm compress, elevate right arm, NSAIDs x 3 , much better, DC on ASA, follow with PCP in 4-5 days.  2. Possible right upper extremity cellulitis. On exam less likely, his leukocytosis is likely due to thrombophlebitis,  treatment as above, he has been given IV antibiotics for cellulitis in the ER again clinical suspicion for infection very low, Keflex x 3 days only.  3. Recent diagnosis of hemolytic anemia. H&H stable when accounted for dilution from IVF. Continue prednisone which was started few days ago on last admission 5 days ago,  thereafter follow with his primary oncologist Dr. Alen Blew.  Results for Richard, Evans (MRN [id number]) as of 07/09/2016 10:15  Ref. Range 07/02/2016 05:48 07/02/2016 10:22 07/02/2016 18:19 07/03/2016 06:52 07/03/2016 15:50 07/04/2016 06:56 07/08/2016 12:03 07/09/2016 04:36 07/09/2016 07:23  Hemoglobin Latest Ref Range: 13.0 - 17.0 g/dL 7.2 (L) 7.1 (L) 7.5 (L) 7.1 (L) 8.1 (L) 8.4 (L) 9.4 (L) 7.7 (L) 7.9 (L)    4. Dyslipidemia. Continue home medications.  5. Essential hypertension. Home regimen continued.  6. Constipation. Improved on bowel regimen.    Discharge diagnosis     Principal Problem:   Cellulitis of right upper arm Active Problems:   ANEMIA, MILD   Hemolytic anemia (HCC)   Splenomegaly   Essential hypertension   Superficial vein thrombosis    Discharge instructions    Discharge Instructions    Discharge instructions    Complete by:  As directed  Follow with Primary MD VIAS,[name], MD in 4-5 days   Get CBC, CMP, 2 view Chest X ray checked  by Primary MD or SNF MD in 5-7 days ( we routinely change or add medications that can affect your baseline labs and fluid status, therefore we recommend that you get the mentioned basic workup next visit with your PCP, your PCP may decide not to get them or add new tests based on their clinical decision)   Activity: As tolerated with Full fall precautions use walker/cane & assistance as needed, keep your R.Arm elevated   Disposition Home     Diet:   Heart Healthy Low carb.  For Heart failure patients - Check your Weight same time everyday, if you gain over 2 pounds, or you develop in leg swelling, experience more shortness  of breath or chest pain, call your Primary MD immediately. Follow Cardiac Low Salt Diet and 1.5 lit/day fluid restriction.   On your next visit with your primary care physician please Get Medicines reviewed and adjusted.   Please request your Prim.MD to go over all Hospital Tests and Procedure/Radiological results at the follow up, please get all Hospital records sent to your Prim MD by signing hospital release before you go home.   If you experience worsening of your admission symptoms, develop shortness of breath, life threatening emergency, suicidal or homicidal thoughts you must seek medical attention immediately by calling 911 or calling your MD immediately  if symptoms less severe.  You Must read complete instructions/literature along with all the possible adverse reactions/side effects for all the Medicines you take and that have been prescribed to you. Take any new Medicines after you have completely understood and accpet all the possible adverse reactions/side effects.   Do not drive, operate heavy machinery, perform activities at heights, swimming or participation in water activities or provide baby sitting services if your were admitted for syncope or siezures until you have seen by Primary MD or a Neurologist and advised to do so again.  Do not drive when taking Pain medications.    Do not take more than prescribed Pain, Sleep and Anxiety Medications  Special Instructions: If you have smoked or chewed Tobacco  in the last 2 yrs please stop smoking, stop any regular Alcohol  and or any Recreational drug use.  Wear Seat belts while driving.   Please note  You were cared for by a hospitalist during your hospital stay. If you have any questions about your discharge medications or the care you received while you were in the hospital after you are discharged, you can call the unit and asked to speak with the hospitalist on call if the hospitalist that took care of you is not  available. Once you are discharged, your primary care physician will handle any further medical issues. Please note that NO REFILLS for any discharge medications will be authorized once you are discharged, as it is imperative that you return to your primary care physician (or establish a relationship with a primary care physician if you do not have one) for your aftercare needs so that they can reassess your need for medications and monitor your lab values.   Increase activity slowly    Complete by:  As directed      Discharge Medications     Medication List    TAKE these medications   aspirin 81 MG EC tablet Take 1 tablet (81 mg total) by mouth daily.   atenolol 25 MG tablet Commonly known  as:  TENORMIN Take 25 mg by mouth at bedtime.   atorvastatin 40 MG tablet Commonly known as:  LIPITOR Take 1 tablet (40 mg total) by mouth daily at 6 PM.   cephALEXin 500 MG capsule Commonly known as:  KEFLEX Take 1 capsule (500 mg total) by mouth every 8 (eight) hours.   diphenhydrAMINE 25 MG tablet Commonly known as:  BENADRYL Take 25 mg by mouth at bedtime.   FLOMAX 0.4 MG Caps capsule Generic drug:  tamsulosin Take 0.4 mg by mouth daily after supper.   Magnesium 500 MG Tabs Take 500 mg by mouth daily as needed (FOR CRAMPING).   Potassium Gluconate 595 MG Caps Take 595 mg by mouth daily as needed (FOR CRAMPING).   predniSONE 20 MG tablet Commonly known as:  DELTASONE Take 4 tablets (80 mg total) by mouth daily with breakfast.       Follow-up Information    VIAS,[name], MD. Schedule an appointment as soon as possible for a visit in 3 day(s).   Specialty:  Internal Medicine Contact information: [address] [place] [address] [phone]        [facility], MD. Schedule an appointment as soon as possible for a visit in 1 week(s).   Specialty:  Oncology Contact information: [address] [place]. [place] [address] [phone]           Major procedures  and Radiology Reports - PLEASE review detailed and final reports thoroughly  -        Dg Chest 2 View  Result Date: 06/30/2016 CLINICAL DATA:  Shortness breath over the last week with increasing weakness and lack of energy. EXAM: CHEST  2 VIEW COMPARISON:  None. FINDINGS: The heart size is normal. Mild interstitial coarsening appears chronic. There is retraction and scarring at the lung apices bilaterally, right greater than left. This is not significantly changed. No focal airspace disease is present. There is no edema or effusion to suggest failure. The visualized soft tissues and bony thorax are unremarkable. IMPRESSION: 1. No acute cardiopulmonary disease. 2. Mild interstitial coarsening appears chronic. Electronically Signed   By: San Morelle M.D.   On: 06/30/2016 10:51  Ct Angio Chest Pe W Or Wo Contrast  Result Date: 06/30/2016 CLINICAL DATA:  Shortness breath and weakness for 1 week with progression. Painless jaundice. Shortness of breath. EXAM: CT ANGIOGRAPHY CHEST CT ABDOMEN AND PELVIS WITH CONTRAST TECHNIQUE: Multidetector CT imaging of the chest was performed using the standard protocol during bolus administration of intravenous contrast. Multiplanar CT image reconstructions and MIPs were obtained to evaluate the vascular anatomy. Multidetector CT imaging of the abdomen and pelvis was performed using the standard protocol during bolus administration of intravenous contrast. CONTRAST:  100 mL Isovue 370 COMPARISON:  CT of the abdomen and pelvis without contrast 04/02/2012 and 07/25/2011 FINDINGS: CTA CHEST FINDINGS Mediastinum/Lymph nodes: Pulmonary arterial opacification is excellent. There are no focal filling defects to suggest pulmonary emboli. The heart size is normal. Coronary artery calcifications are present. No significant pleural or pericardial effusion is present. Bilateral hilar adenopathy is present. Right-sided nodes measure up to 16 x 10 mm. Left-sided hilar soft tissue  measures up to 21 x 14 mm. Subcarinal soft tissue measures up to 2 cm. Sub cm peritracheal nodes are present. Sub cm prevascular nodes are present. Vascular calcifications are present at the aortic arch without aneurysm or significant stenosis. Lungs/Pleura: Use interstitial prominence is noted. Extensive interlobular thickening is present. No significant reticulation is present. Emphysematous changes are  noted at the lung apices, right greater than left. No significant airspace consolidation is present. There is some dependent atelectasis bilaterally. Musculoskeletal: Vertebral body heights alignment are maintained. The ribs are within normal limits. No focal lytic or blastic lesions are present. CT ABDOMEN and PELVIS FINDINGS Hepatobiliary: No focal hepatic lesions are present. There is mild diffuse fatty infiltration of the liver. The common bile duct and gallbladder are within normal limits. Pancreas: No significant inflammatory changes are present. No solid or cystic mass lesion is present. There is no ductal dilation. Spleen: Splenomegaly has progressed. No focal lesions are present. Cephalo-caudad dimension is 14.6 cm. Adrenals/Urinary tract: The adrenal glands are normal bilaterally. A 17 mm cyst is noted posteriorly in the right kidney. An exophytic cyst laterally near the lower pole of the right kidney has slightly increased in size measuring 19 mm. No other discrete lesions are present. A punctate nonobstructing stone is again seen at the upper pole of the right kidney. The ureters are within normal limits bilaterally. A diverticulum is again noted posteriorly and laterally on the left within the urinary bladder. The urinary bladder is otherwise within normal limits. Stomach/Bowel: The stomach and duodenum are within normal limits. The small bowel is unremarkable. The appendix is visualized and normal. The cecum and ascending colon are within normal limits. The transverse colon is unremarkable.  Diverticular changes are present in the descending and sigmoid colon without focal inflammation to suggest diverticulitis. The rectum is within normal limits. Vascular/Lymphatic: Dense atherosclerotic calcifications are present throughout the aorta and branch vessels without aneurysm. Reproductive: The prostate gland is enlarged, measuring up to 5.2 cm, not significantly changed. Other: Fat herniates into the left inguinal canal without bowel. Musculoskeletal: Bilateral L5 pars defects are present. Grade 1 anterolisthesis and a vacuum disc at L5-S1 are stable. Vertebral body heights and alignment are otherwise normal. The bony pelvis is intact otherwise. Review of the MIP images confirms the above findings. IMPRESSION: 1. No evidence for pulmonary embolus. 2. Mild diffuse interstitial changes and bilateral hilar adenopathy raise concern for sarcoid. No primary neoplasm is present. No focal pneumonia is present. Other interstitial disease is considered less likely. 3. Emphysema. 4. Progressive splenomegaly of unknown etiology. Early cirrhosis is not excluded in this patient with hepatic steatosis. 5. Right renal cyst. 6. Extensive atherosclerotic change without aneurysm. Coronary artery disease is present. 7. Sigmoid diverticulosis without diverticulitis. 8. Prostatic enlargement. 9. Left posterolateral bladder diverticulum without complication. Electronically Signed   By: San Morelle M.D.   On: 06/30/2016 13:45  Ct Abdomen Pelvis W Contrast  Result Date: 06/30/2016 CLINICAL DATA:  Shortness breath and weakness for 1 week with progression. Painless jaundice. Shortness of breath. EXAM: CT ANGIOGRAPHY CHEST CT ABDOMEN AND PELVIS WITH CONTRAST TECHNIQUE: Multidetector CT imaging of the chest was performed using the standard protocol during bolus administration of intravenous contrast. Multiplanar CT image reconstructions and MIPs were obtained to evaluate the vascular anatomy. Multidetector CT imaging of  the abdomen and pelvis was performed using the standard protocol during bolus administration of intravenous contrast. CONTRAST:  100 mL Isovue 370 COMPARISON:  CT of the abdomen and pelvis without contrast 04/02/2012 and 07/25/2011 FINDINGS: CTA CHEST FINDINGS Mediastinum/Lymph nodes: Pulmonary arterial opacification is excellent. There are no focal filling defects to suggest pulmonary emboli. The heart size is normal. Coronary artery calcifications are present. No significant pleural or pericardial effusion is present. Bilateral hilar adenopathy is present. Right-sided nodes measure up to 16 x 10 mm. Left-sided hilar soft tissue measures  up to 21 x 14 mm. Subcarinal soft tissue measures up to 2 cm. Sub cm peritracheal nodes are present. Sub cm prevascular nodes are present. Vascular calcifications are present at the aortic arch without aneurysm or significant stenosis. Lungs/Pleura: Use interstitial prominence is noted. Extensive interlobular thickening is present. No significant reticulation is present. Emphysematous changes are noted at the lung apices, right greater than left. No significant airspace consolidation is present. There is some dependent atelectasis bilaterally. Musculoskeletal: Vertebral body heights alignment are maintained. The ribs are within normal limits. No focal lytic or blastic lesions are present. CT ABDOMEN and PELVIS FINDINGS Hepatobiliary: No focal hepatic lesions are present. There is mild diffuse fatty infiltration of the liver. The common bile duct and gallbladder are within normal limits. Pancreas: No significant inflammatory changes are present. No solid or cystic mass lesion is present. There is no ductal dilation. Spleen: Splenomegaly has progressed. No focal lesions are present. Cephalo-caudad dimension is 14.6 cm. Adrenals/Urinary tract: The adrenal glands are normal bilaterally. A 17 mm cyst is noted posteriorly in the right kidney. An exophytic cyst laterally near the lower  pole of the right kidney has slightly increased in size measuring 19 mm. No other discrete lesions are present. A punctate nonobstructing stone is again seen at the upper pole of the right kidney. The ureters are within normal limits bilaterally. A diverticulum is again noted posteriorly and laterally on the left within the urinary bladder. The urinary bladder is otherwise within normal limits. Stomach/Bowel: The stomach and duodenum are within normal limits. The small bowel is unremarkable. The appendix is visualized and normal. The cecum and ascending colon are within normal limits. The transverse colon is unremarkable. Diverticular changes are present in the descending and sigmoid colon without focal inflammation to suggest diverticulitis. The rectum is within normal limits. Vascular/Lymphatic: Dense atherosclerotic calcifications are present throughout the aorta and branch vessels without aneurysm. Reproductive: The prostate gland is enlarged, measuring up to 5.2 cm, not significantly changed. Other: Fat herniates into the left inguinal canal without bowel. Musculoskeletal: Bilateral L5 pars defects are present. Grade 1 anterolisthesis and a vacuum disc at L5-S1 are stable. Vertebral body heights and alignment are otherwise normal. The bony pelvis is intact otherwise. Review of the MIP images confirms the above findings. IMPRESSION: 1. No evidence for pulmonary embolus. 2. Mild diffuse interstitial changes and bilateral hilar adenopathy raise concern for sarcoid. No primary neoplasm is present. No focal pneumonia is present. Other interstitial disease is considered less likely. 3. Emphysema. 4. Progressive splenomegaly of unknown etiology. Early cirrhosis is not excluded in this patient with hepatic steatosis. 5. Right renal cyst. 6. Extensive atherosclerotic change without aneurysm. Coronary artery disease is present. 7. Sigmoid diverticulosis without diverticulitis. 8. Prostatic enlargement. 9. Left  posterolateral bladder diverticulum without complication. Electronically Signed   By: San Morelle M.D.   On: 06/30/2016 13:45   Micro Results     No results found for this or any previous visit (from the past 240 hour(s)).  Today   Subjective    Richard Evans today has no headache,no chest abdominal pain,no new weakness tingling or numbness, feels much better wants to go home today.     Objective   Blood pressure (!) 112/50, pulse (!) 51, temperature 97.6 F (36.4 C), temperature source Oral, resp. rate 16, height 6' (1.829 m), weight 84.4 kg (186 lb), SpO2 92 %.   Intake/Output Summary (Last 24 hours) at 07/09/16 0749 Last data filed at 07/09/16 0421  Gross per  24 hour  Intake           512.67 ml  Output                0 ml  Net           512.67 ml    Exam Awake Alert, Oriented x 3, No new F.N deficits, Normal affect [place].AT,PERRAL Supple Neck,No JVD, No cervical lymphadenopathy appriciated.  Symmetrical Chest wall movement, Good air movement bilaterally, CTAB RRR,No Gallops,Rubs or new Murmurs, No Parasternal Heave +ve B.Sounds, Abd Soft, Non tender, No organomegaly appriciated, No rebound -guarding or rigidity. No Cyanosis, Clubbing or edema, No new Rash or bruise, Right antecubital fossa has small area of redness and swelling much better than yesterday, no warmth   Data Review   CBC w Diff: Lab Results  Component Value Date   WBC 10.3 07/09/2016   HGB 7.9 (L) 07/09/2016   HCT 23.1 (L) 07/09/2016   PLT 138 (L) 07/09/2016   LYMPHOPCT 53 06/30/2016   MONOPCT 10 06/30/2016   EOSPCT 1 06/30/2016   BASOPCT 0 06/30/2016    CMP: Lab Results  Component Value Date   NA 137 07/09/2016   K 3.9 07/09/2016   CL 106 07/09/2016   CO2 25 07/09/2016   BUN 26 (H) 07/09/2016   CREATININE 1.03 07/09/2016   PROT 6.6 06/30/2016   PROT 6.7 06/30/2016   ALBUMIN 4.1 06/30/2016   ALBUMIN 3.9 06/30/2016   BILITOT 4.7 (H) 06/30/2016   BILITOT 4.9 (H) 06/30/2016   ALKPHOS  70 06/30/2016   ALKPHOS 69 06/30/2016   AST 41 06/30/2016   AST 52 (H) 06/30/2016   ALT 27 06/30/2016   ALT 29 06/30/2016  .   Total Time in preparing paper work, data evaluation and todays exam - 35 minutes  Thurnell Lose M.D on 07/09/2016 at 7:49 AM  [facility]   Office  [phone]

## 2016-07-09 NOTE — Discharge Instructions (Signed)
Follow with Primary MD VIAS,[name], MD in 4-5 days   Get CBC, CMP, 2 view Chest X ray checked  by Primary MD or SNF MD in 5-7 days ( we routinely change or add medications that can affect your baseline labs and fluid status, therefore we recommend that you get the mentioned basic workup next visit with your PCP, your PCP may decide not to get them or add new tests based on their clinical decision)   Activity: As tolerated with Full fall precautions use walker/cane & assistance as needed, keep your R.Arm elevated   Disposition Home     Diet:   Heart Healthy Low carb.  For Heart failure patients - Check your Weight same time everyday, if you gain over 2 pounds, or you develop in leg swelling, experience more shortness of breath or chest pain, call your Primary MD immediately. Follow Cardiac Low Salt Diet and 1.5 lit/day fluid restriction.   On your next visit with your primary care physician please Get Medicines reviewed and adjusted.   Please request your Prim.MD to go over all Hospital Tests and Procedure/Radiological results at the follow up, please get all Hospital records sent to your Prim MD by signing hospital release before you go home.   If you experience worsening of your admission symptoms, develop shortness of breath, life threatening emergency, suicidal or homicidal thoughts you must seek medical attention immediately by calling 911 or calling your MD immediately  if symptoms less severe.  You Must read complete instructions/literature along with all the possible adverse reactions/side effects for all the Medicines you take and that have been prescribed to you. Take any new Medicines after you have completely understood and accpet all the possible adverse reactions/side effects.   Do not drive, operate heavy machinery, perform activities at heights, swimming or participation in water activities or provide baby sitting services if your were admitted for syncope or siezures until  you have seen by Primary MD or a Neurologist and advised to do so again.  Do not drive when taking Pain medications.    Do not take more than prescribed Pain, Sleep and Anxiety Medications  Special Instructions: If you have smoked or chewed Tobacco  in the last 2 yrs please stop smoking, stop any regular Alcohol  and or any Recreational drug use.  Wear Seat belts while driving.   Please note  You were cared for by a hospitalist during your hospital stay. If you have any questions about your discharge medications or the care you received while you were in the hospital after you are discharged, you can call the unit and asked to speak with the hospitalist on call if the hospitalist that took care of you is not available. Once you are discharged, your primary care physician will handle any further medical issues. Please note that NO REFILLS for any discharge medications will be authorized once you are discharged, as it is imperative that you return to your primary care physician (or establish a relationship with a primary care physician if you do not have one) for your aftercare needs so that they can reassess your need for medications and monitor your lab values.

## 2016-07-11 ENCOUNTER — Telehealth: Payer: Self-pay | Admitting: Oncology

## 2016-07-11 ENCOUNTER — Ambulatory Visit (HOSPITAL_BASED_OUTPATIENT_CLINIC_OR_DEPARTMENT_OTHER): Payer: Medicare Other | Admitting: Oncology

## 2016-07-11 ENCOUNTER — Other Ambulatory Visit (HOSPITAL_BASED_OUTPATIENT_CLINIC_OR_DEPARTMENT_OTHER): Payer: Medicare Other

## 2016-07-11 VITALS — BP 138/61 | HR 67 | Temp 98.1°F | Resp 18 | Ht 72.0 in | Wt 191.7 lb

## 2016-07-11 DIAGNOSIS — D591 Autoimmune hemolytic anemia, unspecified: Secondary | ICD-10-CM

## 2016-07-11 DIAGNOSIS — D598 Other acquired hemolytic anemias: Secondary | ICD-10-CM

## 2016-07-11 DIAGNOSIS — L03113 Cellulitis of right upper limb: Secondary | ICD-10-CM | POA: Diagnosis not present

## 2016-07-11 LAB — CBC WITH DIFFERENTIAL/PLATELET
BASO%: 0 % (ref 0.0–2.0)
BASOS ABS: 0 10*3/uL (ref 0.0–0.1)
EOS ABS: 0 10*3/uL (ref 0.0–0.5)
EOS%: 0.1 % (ref 0.0–7.0)
HEMATOCRIT: 28.3 % — AB (ref 38.4–49.9)
HEMOGLOBIN: 9.2 g/dL — AB (ref 13.0–17.1)
LYMPH#: 1.6 10*3/uL (ref 0.9–3.3)
LYMPH%: 17.3 % (ref 14.0–49.0)
MCH: 34.6 pg — AB (ref 27.2–33.4)
MCHC: 32.5 g/dL (ref 32.0–36.0)
MCV: 106.4 fL — ABNORMAL HIGH (ref 79.3–98.0)
MONO#: 0.1 10*3/uL (ref 0.1–0.9)
MONO%: 1.2 % (ref 0.0–14.0)
NEUT#: 7.6 10*3/uL — ABNORMAL HIGH (ref 1.5–6.5)
NEUT%: 81.4 % — AB (ref 39.0–75.0)
PLATELETS: 173 10*3/uL (ref 140–400)
RBC: 2.66 10*6/uL — ABNORMAL LOW (ref 4.20–5.82)
RDW: 19.3 % — ABNORMAL HIGH (ref 11.0–14.6)
WBC: 9.4 10*3/uL (ref 4.0–10.3)

## 2016-07-11 LAB — COMPREHENSIVE METABOLIC PANEL
ALBUMIN: 3.4 g/dL — AB (ref 3.5–5.0)
ALK PHOS: 59 U/L (ref 40–150)
ALT: 63 U/L — AB (ref 0–55)
ANION GAP: 9 meq/L (ref 3–11)
AST: 28 U/L (ref 5–34)
BUN: 26.9 mg/dL — AB (ref 7.0–26.0)
CALCIUM: 8.7 mg/dL (ref 8.4–10.4)
CHLORIDE: 104 meq/L (ref 98–109)
CO2: 25 mEq/L (ref 22–29)
CREATININE: 1.2 mg/dL (ref 0.7–1.3)
EGFR: 59 mL/min/{1.73_m2} — ABNORMAL LOW (ref >90)
Glucose: 328 mg/dl — ABNORMAL HIGH (ref 70–140)
Potassium: 4.3 mEq/L (ref 3.5–5.1)
Sodium: 138 mEq/L (ref 136–145)
Total Bilirubin: 2.25 mg/dL — ABNORMAL HIGH (ref 0.20–1.20)
Total Protein: 6.6 g/dL (ref 6.4–8.3)

## 2016-07-11 LAB — LACTATE DEHYDROGENASE: LDH: 418 U/L — AB (ref 125–245)

## 2016-07-11 MED ORDER — PREDNISONE 10 MG PO TABS: ORAL_TABLET | ORAL | 0 refills | Status: DC

## 2016-07-11 MED ORDER — CEPHALEXIN 500 MG PO CAPS: 500.0000 mg | ORAL_CAPSULE | Freq: Three times a day (TID) | ORAL | 0 refills | Status: DC

## 2016-07-11 NOTE — Telephone Encounter (Signed)
per pof to sch pt appt-gave pt copy of avs/cal °

## 2016-07-11 NOTE — Progress Notes (Signed)
Hematology and Oncology Follow Up Visit  Richard Evans [id number] [birth year] 70 y.o. 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 He presented with hemoglobin of 7.5, MCV 106 with elevated reticulocyte count and haptoglobin less than 10. Coombs testing was positive. CT scan of the chest abdomen and pelvis showed nonspecific hilar adenopathy.   Prior Therapy:  Status post packed red cell transfusion for 1 unit on 07/04/2016.  Current therapy: Prednisone 80 mg daily started on 06/30/2016.  Interim History:  Richard Evans presents today for a follow-up visit. He is a gentleman with a recent diagnosis of autoimmune hemolytic anemia and was discharged from the hospital on 07/04/2016. He was hospitalized again on 07/08/2016 after presenting with erythema around the IV site in the right antecubital space. He was diagnosed with cellulitis and superficial thrombophlebitis. He was treated with anti-inflammatories and prescription for cephalexin was given. He reports that the erythema and pain has improved dramatically and is able to his arm without any difficulties.  He denied any chest pain or shortness of breath. His exercise tolerance has improved at this time. He denied any fevers or sweats. He does report a spider bite prior to this incident.  He does not report any headaches, blurry vision, syncope or seizures. He is not report any fevers or chills or sweats or weight loss. He does not report any chest pain, palpitation, orthopnea or leg edema. He does not report any cough, wheezing or hemoptysis. He does not report any nausea, vomiting or abdominal pain. He does not report any frequency urgency or hesitancy. He has not reported any skeletal complaints. Remaining review of systems unremarkable   Medications: I have reviewed the patient's current medications.  Current Outpatient Prescriptions   Medication Sig Dispense Refill  . aspirin EC 81 MG EC tablet Take 1 tablet (81 mg total) by mouth daily. 30 tablet 0  . atenolol (TENORMIN) 25 MG tablet Take 25 mg by mouth at bedtime.     Marland Kitchen atorvastatin (LIPITOR) 40 MG tablet Take 1 tablet (40 mg total) by mouth daily at 6 PM. 30 tablet 0  . cephALEXin (KEFLEX) 500 MG capsule Take 1 capsule (500 mg total) by mouth every 8 (eight) hours. 21 capsule 0  . diphenhydrAMINE (BENADRYL) 25 MG tablet Take 25 mg by mouth at bedtime.     . Magnesium 500 MG TABS Take 500 mg by mouth daily as needed (FOR CRAMPING).    . Potassium Gluconate 595 MG CAPS Take 595 mg by mouth daily as needed (FOR CRAMPING).    . Tamsulosin HCl (FLOMAX) 0.4 MG CAPS Take 0.4 mg by mouth daily after supper.     . predniSONE (DELTASONE) 10 MG tablet Take 8 tablets daily till 8/16. 160 tablet 0   No current facility-administered medications for this visit.      Allergies:  Allergies  Allergen Reactions  . Adhesive [Tape] Itching and Rash  . Prednisone Other (See Comments)    Chest tightness in past. Tolerated solumedrol well, so retried d/t medical necessity. Tolerating well during inpatient stay in August 2017    Past Medical History, Surgical history, Social history, and Family History were reviewed and updated.  Physical Exam: Blood pressure 138/61, pulse 67, temperature 98.1 F (36.7 C), temperature source Oral, resp. rate 18, height 6' (1.829 m), weight 191 lb 11.2 oz (87 kg), SpO2 95 %. ECOG: 0 General appearance: alert and cooperative appeared  without distress. Head: Normocephalic, without obvious abnormality Neck: no adenopathy Lymph nodes: Cervical, supraclavicular, and axillary nodes normal. Heart:regular rate and rhythm, S1, S2 normal, no murmur, click, rub or gallop Lung:chest clear, no wheezing, rales, normal symmetric air entry.  Abdomin: soft, non-tender, without masses or organomegaly EXT: Erythema noted around the antecubital space on the right.  Purulent discharge was also noted which was expressed easily.   Lab Results: Lab Results  Component Value Date   WBC 9.4 07/11/2016   HGB 9.2 (L) 07/11/2016   HCT 28.3 (L) 07/11/2016   MCV 106.4 (H) 07/11/2016   PLT 173 07/11/2016     Chemistry      Component Value Date/Time   NA 137 07/09/2016 0436   K 3.9 07/09/2016 0436   CL 106 07/09/2016 0436   CO2 25 07/09/2016 0436   BUN 26 (H) 07/09/2016 0436   CREATININE 1.03 07/09/2016 0436      Component Value Date/Time   CALCIUM 8.3 (L) 07/09/2016 0436   ALKPHOS 70 06/30/2016 1032   ALKPHOS 69 06/30/2016 1032   AST 41 06/30/2016 1032   AST 52 (H) 06/30/2016 1032   ALT 27 06/30/2016 1032   ALT 29 06/30/2016 1032   BILITOT 4.7 (H) 06/30/2016 1032   BILITOT 4.9 (H) 06/30/2016 1032       Impression and Plan:   70 year old gentleman with the following issues:  1.Autoimmune hemolytic anemia presented with hemoglobin of 7.5 and positive Coombs testing. He was diagnosed In 07/30/2017currently on prednisone 80 mg daily.  His hemoglobin appears to be responding nicely up to 9.2. He is minimally symptomatic at this time and does not require transfusion. I recommended continue prednisone for the time being until his hemoglobin have normalized. We will taper his prednisone by 10 mg a week after he achieves complete response.  Etiology of his autoimmune hemolytic anemia appears to be idiopathic. The possibility of a lymphoproliferative disorder because of his thoracic adenopathy and splenomegaly is a consideration but is less likely. We will continue to monitor these in the future with repeat imaging studies.  2. Superficial phlebitis and cellulitis of the right arm: Appears to be improving I asked him to continue cephalexin for 7 days and discontinue at that time.  3. Follow-up: Will be in one week to recheck his CBC.   Zola Button, MD 8/10/20171:28 PM

## 2016-07-13 LAB — CULTURE, BLOOD (ROUTINE X 2)
Culture: NO GROWTH
Culture: NO GROWTH

## 2016-07-16 DIAGNOSIS — L0291 Cutaneous abscess, unspecified: Secondary | ICD-10-CM | POA: Diagnosis not present

## 2016-07-17 ENCOUNTER — Telehealth: Payer: Self-pay | Admitting: Oncology

## 2016-07-17 ENCOUNTER — Ambulatory Visit (HOSPITAL_BASED_OUTPATIENT_CLINIC_OR_DEPARTMENT_OTHER): Payer: Medicare Other | Admitting: Oncology

## 2016-07-17 ENCOUNTER — Other Ambulatory Visit (HOSPITAL_BASED_OUTPATIENT_CLINIC_OR_DEPARTMENT_OTHER): Payer: Medicare Other

## 2016-07-17 VITALS — BP 126/52 | HR 55 | Temp 98.0°F | Resp 18 | Ht 72.0 in | Wt 191.0 lb

## 2016-07-17 DIAGNOSIS — D591 Other autoimmune hemolytic anemias: Secondary | ICD-10-CM

## 2016-07-17 DIAGNOSIS — R61 Generalized hyperhidrosis: Secondary | ICD-10-CM

## 2016-07-17 DIAGNOSIS — D598 Other acquired hemolytic anemias: Secondary | ICD-10-CM

## 2016-07-17 LAB — CBC WITH DIFFERENTIAL/PLATELET
BASO%: 0 % (ref 0.0–2.0)
Basophils Absolute: 0 10*3/uL (ref 0.0–0.1)
EOS ABS: 0 10*3/uL (ref 0.0–0.5)
EOS%: 0.2 % (ref 0.0–7.0)
HCT: 33.9 % — ABNORMAL LOW (ref 38.4–49.9)
HGB: 11.2 g/dL — ABNORMAL LOW (ref 13.0–17.1)
LYMPH%: 17.3 % (ref 14.0–49.0)
MCH: 34.5 pg — ABNORMAL HIGH (ref 27.2–33.4)
MCHC: 33 g/dL (ref 32.0–36.0)
MCV: 104.3 fL — AB (ref 79.3–98.0)
MONO#: 0.3 10*3/uL (ref 0.1–0.9)
MONO%: 3.1 % (ref 0.0–14.0)
NEUT%: 79.4 % — ABNORMAL HIGH (ref 39.0–75.0)
NEUTROS ABS: 8.6 10*3/uL — AB (ref 1.5–6.5)
Platelets: 144 10*3/uL (ref 140–400)
RBC: 3.25 10*6/uL — AB (ref 4.20–5.82)
RDW: 17 % — ABNORMAL HIGH (ref 11.0–14.6)
WBC: 10.9 10*3/uL — AB (ref 4.0–10.3)
lymph#: 1.9 10*3/uL (ref 0.9–3.3)

## 2016-07-17 NOTE — Telephone Encounter (Signed)
Gave patient avs report and appointments for August.  °

## 2016-07-17 NOTE — Progress Notes (Signed)
Hematology and Oncology Follow Up Visit  Richard Evans [id number] 01-26-1946 70 y.o. 07/17/2016 11:28 AM [name], MDVias, [name], MD   Principle Diagnosis: 70 year old gentleman with autoimmune hemolytic anemia diagnosed on July 30 of 2017. He presented with hemoglobin of 7.5, MCV 106 with elevated reticulocyte count and haptoglobin less than 10. Coombs testing was positive. CT scan of the chest abdomen and pelvis showed nonspecific hilar adenopathy.   Prior Therapy:  Status post packed red cell transfusion for 1 unit on 07/04/2016.  Current therapy: Prednisone 80 mg daily started on 06/30/2016. His dose will go down to 70 mg on 07/24/2016.  Interim History:  Richard Evans presents today for a follow-up visit. Since the last visit, he continues to report excellent improvement in his overall health. He does not report any bleeding or thrombosis episodes. He denied any fatigue, dyspnea on exertion or chest pain. He continues to be on prednisone without any major complications. He denied any lower extremity edema, weight gain or any facial swelling. He is ambulating without any difficulties. He continues to report night sweats at times without any other constitutional symptoms.   He does not report any headaches, blurry vision, syncope or seizures. He is not report any fevers or chills or weight loss. He does not report any chest pain, palpitation, orthopnea or leg edema. He does not report any cough, wheezing or hemoptysis. He does not report any nausea, vomiting or abdominal pain. He does not report any frequency urgency or hesitancy. He has not reported any skeletal complaints. Remaining review of systems unremarkable   Medications: I have reviewed the patient's current medications.  Current Outpatient Prescriptions  Medication Sig Dispense Refill  . aspirin EC 81 MG EC tablet Take 1 tablet (81 mg total) by mouth daily. 30 tablet 0  . atenolol (TENORMIN) 25 MG tablet Take 25 mg by mouth at  bedtime.     Marland Kitchen atorvastatin (LIPITOR) 40 MG tablet Take 1 tablet (40 mg total) by mouth daily at 6 PM. 30 tablet 0  . cephALEXin (KEFLEX) 500 MG capsule Take 1 capsule (500 mg total) by mouth every 8 (eight) hours. 21 capsule 0  . diphenhydrAMINE (BENADRYL) 25 MG tablet Take 25 mg by mouth at bedtime.     . Magnesium 500 MG TABS Take 500 mg by mouth daily as needed (FOR CRAMPING).    . Potassium Gluconate 595 MG CAPS Take 595 mg by mouth daily as needed (FOR CRAMPING).    Marland Kitchen predniSONE (DELTASONE) 10 MG tablet Take 8 tablets daily till 8/16. 160 tablet 0  . Tamsulosin HCl (FLOMAX) 0.4 MG CAPS Take 0.4 mg by mouth daily after supper.      No current facility-administered medications for this visit.      Allergies:  Allergies  Allergen Reactions  . Adhesive [Tape] Itching and Rash  . Prednisone Other (See Comments)    Chest tightness in past. Tolerated solumedrol well, so retried d/t medical necessity. Tolerating well during inpatient stay in August 2017    Past Medical History, Surgical history, Social history, and Family History were reviewed and updated.  Physical Exam: Blood pressure (!) 126/52, pulse (!) 55, temperature 98 F (36.7 C), temperature source Oral, resp. rate 18, height 6' (1.829 m), weight 191 lb (86.6 kg), SpO2 95 %. ECOG: 0 General appearance: Alert, awake gentleman without distress. Head: Normocephalic, without obvious abnormality no oral thrush. Neck: no adenopathy Lymph nodes: Cervical, supraclavicular, and axillary nodes normal. Heart:regular rate and rhythm, S1, S2 normal, no  murmur, click, rub or gallop Lung:chest clear, no wheezing, rales, normal symmetric air entry.  Abdomin: soft, non-tender, without masses or organomegaly EXT: Erythema noted around the antecubital space appears to be resolving.   Lab Results: Lab Results  Component Value Date   WBC 10.9 (H) 07/17/2016   HGB 11.2 (L) 07/17/2016   HCT 33.9 (L) 07/17/2016   MCV 104.3 (H) 07/17/2016    PLT 144 07/17/2016     Chemistry      Component Value Date/Time   NA 138 07/11/2016 1245   K 4.3 07/11/2016 1245   CL 106 07/09/2016 0436   CO2 25 07/11/2016 1245   BUN 26.9 (H) 07/11/2016 1245   CREATININE 1.2 07/11/2016 1245      Component Value Date/Time   CALCIUM 8.7 07/11/2016 1245   ALKPHOS 59 07/11/2016 1245   AST 28 07/11/2016 1245   ALT 63 (H) 07/11/2016 1245   BILITOT 2.25 (H) 07/11/2016 1245       Impression and Plan:   70 year old gentleman with the following issues:  1.Autoimmune hemolytic anemia presented with hemoglobin of 7.5 and positive Coombs testing. He was diagnosed In 07/30/2017currently on prednisone 80 mg daily.  His hemoglobin Continues to improve on prednisone and currently up to 11.2. I recommended continuing the current prednisone dose and reduce it to 70 mg in one week. The plan is to reduce it by 10 mg every week tablets tapered off.   2. Superficial phlebitis and cellulitis of the right arm: Resolving at this time and he is finishing a course of antibiotics utilizing cephalexin.  3. Night sweats: He will require repeat imaging studies to determine whether he is developing a lymphoproliferative disorder. He did have imaging studies in July 2017 which showed borderline enlargement of his lymph glands and splenomegaly.  4. Follow-up: Will be in 2 weeks to check his CBC.   [facility], MD 8/16/201711:28 AM

## 2016-07-31 ENCOUNTER — Ambulatory Visit (HOSPITAL_BASED_OUTPATIENT_CLINIC_OR_DEPARTMENT_OTHER): Payer: Medicare Other | Admitting: Oncology

## 2016-07-31 ENCOUNTER — Other Ambulatory Visit (HOSPITAL_BASED_OUTPATIENT_CLINIC_OR_DEPARTMENT_OTHER): Payer: Medicare Other

## 2016-07-31 ENCOUNTER — Telehealth: Payer: Self-pay | Admitting: Oncology

## 2016-07-31 VITALS — BP 131/60 | HR 63 | Temp 98.1°F | Resp 18 | Wt 190.1 lb

## 2016-07-31 DIAGNOSIS — D598 Other acquired hemolytic anemias: Secondary | ICD-10-CM

## 2016-07-31 DIAGNOSIS — D591 Other autoimmune hemolytic anemias: Secondary | ICD-10-CM | POA: Diagnosis not present

## 2016-07-31 LAB — CBC WITH DIFFERENTIAL/PLATELET
BASO%: 0.4 % (ref 0.0–2.0)
Basophils Absolute: 0 10*3/uL (ref 0.0–0.1)
EOS%: 0 % (ref 0.0–7.0)
Eosinophils Absolute: 0 10*3/uL (ref 0.0–0.5)
HEMATOCRIT: 37.1 % — AB (ref 38.4–49.9)
HGB: 12.6 g/dL — ABNORMAL LOW (ref 13.0–17.1)
LYMPH#: 1.1 10*3/uL (ref 0.9–3.3)
LYMPH%: 12.9 % — AB (ref 14.0–49.0)
MCH: 33.6 pg — ABNORMAL HIGH (ref 27.2–33.4)
MCHC: 33.9 g/dL (ref 32.0–36.0)
MCV: 99.1 fL — ABNORMAL HIGH (ref 79.3–98.0)
MONO#: 0.2 10*3/uL (ref 0.1–0.9)
MONO%: 2.1 % (ref 0.0–14.0)
NEUT%: 84.6 % — AB (ref 39.0–75.0)
NEUTROS ABS: 6.9 10*3/uL — AB (ref 1.5–6.5)
Platelets: 107 10*3/uL — ABNORMAL LOW (ref 140–400)
RBC: 3.74 10*6/uL — AB (ref 4.20–5.82)
RDW: 14.7 % — ABNORMAL HIGH (ref 11.0–14.6)
WBC: 8.2 10*3/uL (ref 4.0–10.3)

## 2016-07-31 MED ORDER — PREDNISONE 10 MG PO TABS: ORAL_TABLET | ORAL | 0 refills | Status: DC

## 2016-07-31 NOTE — Telephone Encounter (Signed)
GAVE PATIENT AVS REPORT AND APPOINTMENTS FOR September.  °

## 2016-07-31 NOTE — Progress Notes (Signed)
Hematology and Oncology Follow Up Visit  Richard Evans [id number] 1946/04/12 70 y.o. 07/31/2016 2:38 PM Richard Evans, MDVias, [name], MD   Principle Diagnosis: 70 year old gentleman with autoimmune hemolytic anemia diagnosed on July 30 of 2017. He presented with hemoglobin of 7.5, MCV 106 with elevated reticulocyte count and haptoglobin less than 10. Coombs testing was positive. CT scan of the chest abdomen and pelvis showed nonspecific hilar adenopathy.   Prior Therapy:  Status post packed red cell transfusion for 1 unit on 07/04/2016.  Current therapy: Prednisone 70 mg daily started on 06/30/2016. His dose will go down to 60 mg on 08/01/2016.  Interim History:  Richard Evans presents today for a follow-up visit. Since the last visit, he reports no major changes in his health. He have regained all activities of daily living without any major deterioration or decline. He does report some complications related to prednisone including increased appetite, occasional tremors and somnolence. He denies any dyspepsia or epigastric pain.    He does not report any bleeding or thrombosis episodes. He denied any fatigue, dyspnea on exertion or chest pain. He denied any lower extremity edema, weight gain or any facial swelling. He is ambulating without any difficulties. He does not report any constitutional symptoms or weight loss.   He does not report any headaches, blurry vision, syncope or seizures. He is not report any fevers or chills or weight loss. He does not report any chest pain, palpitation, orthopnea or leg edema. He does not report any cough, wheezing or hemoptysis. He does not report any nausea, vomiting or abdominal pain. He does not report any frequency urgency or hesitancy. He has not reported any skeletal complaints. Remaining review of systems unremarkable   Medications: I have reviewed the patient's current medications.  Current Outpatient Prescriptions  Medication Sig Dispense Refill  .  aspirin EC 81 MG EC tablet Take 1 tablet (81 mg total) by mouth daily. 30 tablet 0  . atenolol (TENORMIN) 25 MG tablet Take 25 mg by mouth at bedtime.     Marland Kitchen atorvastatin (LIPITOR) 40 MG tablet Take 1 tablet (40 mg total) by mouth daily at 6 PM. 30 tablet 0  . cephALEXin (KEFLEX) 500 MG capsule Take 1 capsule (500 mg total) by mouth every 8 (eight) hours. 21 capsule 0  . diphenhydrAMINE (BENADRYL) 25 MG tablet Take 25 mg by mouth at bedtime.     . Magnesium 500 MG TABS Take 500 mg by mouth daily as needed (FOR CRAMPING).    . Potassium Gluconate 595 MG CAPS Take 595 mg by mouth daily as needed (FOR CRAMPING).    Marland Kitchen predniSONE (DELTASONE) 10 MG tablet Take 6 tablets for a week then,  Take 5 tablets for a week then,  Take 4 tablets for week then,  Take 3 tablets for week then,  Take 2 tablets for a week then  Take one tablet for a week then stop. 160 tablet 0  . Tamsulosin HCl (FLOMAX) 0.4 MG CAPS Take 0.4 mg by mouth daily after supper.      No current facility-administered medications for this visit.      Allergies:  Allergies  Allergen Reactions  . Adhesive [Tape] Itching and Rash  . Prednisone Other (See Comments)    Chest tightness in past. Tolerated solumedrol well, so retried d/t medical necessity. Tolerating well during inpatient stay in August 2017    Past Medical History, Surgical history, Social history, and Family History were reviewed and updated.  Physical Exam: Blood pressure  131/60, pulse 63, temperature 98.1 F (36.7 C), temperature source Oral, resp. rate 18, weight 190 lb 1.6 oz (86.2 kg), SpO2 94 %. ECOG: 0 General appearance: Well-appearing gentleman without distress. Head: Normocephalic, without obvious abnormality no oral ulcers or thrush. Neck: no adenopathy Lymph nodes: Cervical, supraclavicular, and axillary nodes normal. Heart:regular rate and rhythm, S1, S2 normal, no murmur, click, rub or gallop Lung:chest clear, no wheezing, rales, normal symmetric air  entry.  Abdomin: soft, non-tender, without masses or organomegaly EXT: No edema or erythema. His right antecubital infection sites appear have resolved.   Lab Results: Lab Results  Component Value Date   WBC 8.2 07/31/2016   HGB 12.6 (L) 07/31/2016   HCT 37.1 (L) 07/31/2016   MCV 99.1 (H) 07/31/2016   PLT 107 (L) 07/31/2016     Chemistry      Component Value Date/Time   NA 138 07/11/2016 1245   K 4.3 07/11/2016 1245   CL 106 07/09/2016 0436   CO2 25 07/11/2016 1245   BUN 26.9 (H) 07/11/2016 1245   CREATININE 1.2 07/11/2016 1245      Component Value Date/Time   CALCIUM 8.7 07/11/2016 1245   ALKPHOS 59 07/11/2016 1245   AST 28 07/11/2016 1245   ALT 63 (H) 07/11/2016 1245   BILITOT 2.25 (H) 07/11/2016 1245       Impression and Plan:   70 year old gentleman with the following issues:  1. Autoimmune hemolytic anemia presented with hemoglobin of 7.5 and positive Coombs testing. He was diagnosed In 07/30/2017currently on prednisone 70 mg daily.  His hemoglobin has normalized at this time on prednisone alone. The plan is to continue with prednisone taper by 10 mg every week long as his hemoglobin remains stable. I will recheck his hemoglobin in 3 weeks.   2. Superficial phlebitis and cellulitis of the right arm: Resolved at this time without any relapse.  3. Night sweats: He will require repeat imaging studies to determine whether he is developing a lymphoproliferative disorder. He did have imaging studies in July 2017 which showed borderline enlargement of his lymph glands and splenomegaly. These will be repeated in October 2017.  4. Follow-up: Will be in 3 weeks to check his CBC.   Zola Button, MD 8/30/20172:38 PM

## 2016-08-21 ENCOUNTER — Other Ambulatory Visit (HOSPITAL_BASED_OUTPATIENT_CLINIC_OR_DEPARTMENT_OTHER): Payer: Medicare Other

## 2016-08-21 ENCOUNTER — Ambulatory Visit (HOSPITAL_BASED_OUTPATIENT_CLINIC_OR_DEPARTMENT_OTHER): Payer: Medicare Other | Admitting: Oncology

## 2016-08-21 ENCOUNTER — Telehealth: Payer: Self-pay | Admitting: Oncology

## 2016-08-21 VITALS — BP 120/64 | HR 63 | Temp 98.0°F | Resp 18 | Ht 72.0 in | Wt 188.4 lb

## 2016-08-21 DIAGNOSIS — D598 Other acquired hemolytic anemias: Secondary | ICD-10-CM | POA: Diagnosis not present

## 2016-08-21 DIAGNOSIS — R739 Hyperglycemia, unspecified: Secondary | ICD-10-CM | POA: Diagnosis not present

## 2016-08-21 DIAGNOSIS — Z8672 Personal history of thrombophlebitis: Secondary | ICD-10-CM

## 2016-08-21 LAB — COMPREHENSIVE METABOLIC PANEL
ALT: 43 U/L (ref 0–55)
ANION GAP: 9 meq/L (ref 3–11)
AST: 19 U/L (ref 5–34)
Albumin: 3.7 g/dL (ref 3.5–5.0)
Alkaline Phosphatase: 58 U/L (ref 40–150)
BUN: 22.1 mg/dL (ref 7.0–26.0)
CALCIUM: 9.1 mg/dL (ref 8.4–10.4)
CHLORIDE: 101 meq/L (ref 98–109)
CO2: 25 mEq/L (ref 22–29)
Creatinine: 1.2 mg/dL (ref 0.7–1.3)
EGFR: 62 mL/min/{1.73_m2} — ABNORMAL LOW (ref >90)
Glucose: 335 mg/dl — ABNORMAL HIGH (ref 70–140)
POTASSIUM: 4.9 meq/L (ref 3.5–5.1)
Sodium: 135 mEq/L — ABNORMAL LOW (ref 136–145)
Total Bilirubin: 1.95 mg/dL — ABNORMAL HIGH (ref 0.20–1.20)
Total Protein: 6.8 g/dL (ref 6.4–8.3)

## 2016-08-21 LAB — CBC WITH DIFFERENTIAL/PLATELET
BASO%: 0.1 % (ref 0.0–2.0)
BASOS ABS: 0 10*3/uL (ref 0.0–0.1)
EOS%: 0.1 % (ref 0.0–7.0)
Eosinophils Absolute: 0 10*3/uL (ref 0.0–0.5)
HEMATOCRIT: 34.9 % — AB (ref 38.4–49.9)
HGB: 12.5 g/dL — ABNORMAL LOW (ref 13.0–17.1)
LYMPH#: 1.3 10*3/uL (ref 0.9–3.3)
LYMPH%: 17 % (ref 14.0–49.0)
MCH: 33.4 pg (ref 27.2–33.4)
MCHC: 35.8 g/dL (ref 32.0–36.0)
MCV: 93.3 fL (ref 79.3–98.0)
MONO#: 0.2 10*3/uL (ref 0.1–0.9)
MONO%: 2.4 % (ref 0.0–14.0)
NEUT#: 6.3 10*3/uL (ref 1.5–6.5)
NEUT%: 80.4 % — AB (ref 39.0–75.0)
Platelets: 111 10*3/uL — ABNORMAL LOW (ref 140–400)
RBC: 3.74 10*6/uL — ABNORMAL LOW (ref 4.20–5.82)
RDW: 14.3 % (ref 11.0–14.6)
WBC: 7.8 10*3/uL (ref 4.0–10.3)

## 2016-08-21 LAB — TECHNOLOGIST REVIEW

## 2016-08-21 NOTE — Telephone Encounter (Signed)
Avs report and appointment schedule given to patient,per 08/21/16 los.

## 2016-08-21 NOTE — Progress Notes (Signed)
Hematology and Oncology Follow Up Visit  Richard Evans [id number] [birth year] 70 y.o. 08/21/2016 2:44 PM Richard Evans, MDVias, [name], MD   Principle Diagnosis: 70 year old gentleman with autoimmune hemolytic anemia diagnosed on July 30 of 2017. He presented with hemoglobin of 7.5, MCV 106 with elevated reticulocyte count and haptoglobin less than 10. Coombs testing was positive. CT scan of the chest abdomen and pelvis showed nonspecific hilar adenopathy.   Prior Therapy:  Status post packed red cell transfusion for 1 unit on 07/04/2016.  Current therapy: Prednisone 70 mg daily started on 06/30/2016. His dose will go down to 30 mg on 08/22/2016  Interim History:  Richard Evans presents today for a follow-up visit. Since the last visit, he reports feeling reasonably well without any major complaints. He remains active and performs activities of daily living without any decline. He did report some fatigue and tiredness and his blood sugar was checked and was around 300. He is feeling well today and has no complaints.    He does not report any bleeding. He denied any lower extremity edema, weight gain or any facial swelling. He is ambulating without any difficulties. He does not report any constitutional symptoms or weight loss. His appetite remains excellent but does report frequent urination.   He does not report any headaches, blurry vision, syncope or seizures. He is not report any fevers or chills or weight loss. He does not report any chest pain, palpitation, orthopnea or leg edema. He does not report any cough, wheezing or hemoptysis. He does not report any nausea, vomiting or abdominal pain. He does not report any frequency urgency or hesitancy. He has not reported any skeletal complaints. Remaining review of systems unremarkable   Medications: I have reviewed the patient's current medications.  Current Outpatient Prescriptions  Medication Sig Dispense Refill  . aspirin EC 81 MG EC tablet Take  1 tablet (81 mg total) by mouth daily. 30 tablet 0  . atenolol (TENORMIN) 25 MG tablet Take 25 mg by mouth at bedtime.     . cephALEXin (KEFLEX) 500 MG capsule Take 1 capsule (500 mg total) by mouth every 8 (eight) hours. 21 capsule 0  . diphenhydrAMINE (BENADRYL) 25 MG tablet Take 25 mg by mouth at bedtime.     . Magnesium 500 MG TABS Take 500 mg by mouth daily as needed (FOR CRAMPING).    . Potassium Gluconate 595 MG CAPS Take 595 mg by mouth daily as needed (FOR CRAMPING).    Marland Kitchen predniSONE (DELTASONE) 10 MG tablet Take 6 tablets for a week then,  Take 5 tablets for a week then,  Take 4 tablets for week then,  Take 3 tablets for week then,  Take 2 tablets for a week then  Take one tablet for a week then stop. 160 tablet 0  . Tamsulosin HCl (FLOMAX) 0.4 MG CAPS Take 0.4 mg by mouth daily after supper.      No current facility-administered medications for this visit.      Allergies:  Allergies  Allergen Reactions  . Adhesive [Tape] Itching and Rash  . Prednisone Other (See Comments)    Chest tightness in past. Tolerated solumedrol well, so retried d/t medical necessity. Tolerating well during inpatient stay in August 2017    Past Medical History, Surgical history, Social history, and Family History were reviewed and updated.  Physical Exam: Blood pressure 120/64, pulse 63, temperature 98 F (36.7 C), temperature source Oral, resp. rate 18, height 6' (1.829 m), weight 188 lb 6.4 oz (  85.5 kg), SpO2 100 %. ECOG: 0 General appearance: Alert, awake gentleman without distress. Head: Normocephalic, without obvious abnormality no rebound or guarding. Neck: no adenopathy Lymph nodes: Cervical, supraclavicular, and axillary nodes normal. Heart:regular rate and rhythm, S1, S2 normal, no murmur, click, rub or gallop Lung:chest clear, no wheezing, rales, normal symmetric air entry.  Abdomin: soft, non-tender, without masses or organomegaly EXT: No edema or erythema.   Lab Results: Lab  Results  Component Value Date   WBC 7.8 08/21/2016   HGB 12.5 (L) 08/21/2016   HCT 34.9 (L) 08/21/2016   MCV 93.3 08/21/2016   PLT 111 (L) 08/21/2016     Chemistry      Component Value Date/Time   NA 138 07/11/2016 1245   K 4.3 07/11/2016 1245   CL 106 07/09/2016 0436   CO2 25 07/11/2016 1245   BUN 26.9 (H) 07/11/2016 1245   CREATININE 1.2 07/11/2016 1245      Component Value Date/Time   CALCIUM 8.7 07/11/2016 1245   ALKPHOS 59 07/11/2016 1245   AST 28 07/11/2016 1245   ALT 63 (H) 07/11/2016 1245   BILITOT 2.25 (H) 07/11/2016 1245       Impression and Plan:   70 year old gentleman with the following issues:  1. Autoimmune hemolytic anemia presented with hemoglobin of 7.5 and positive Coombs testing. He was diagnosed In 07/30/2017currently on prednisone 70 mg daily.  His hemoglobin has normalized at this time on prednisone alone. The plan is to continue with prednisone taper by 10 mg every week long as his hemoglobin remains stable. He is to reduce prednisone to 30 mg starting on 08/22/2016 and he should be off prednisone in the next 3 weeks.   2. Superficial phlebitis and cellulitis of the right arm: Resolved at this time without any relapse.  3. Hyperglycemia: I urged him to follow-up with his primary care provider as he may be developing diabetes related to prednisone. I encouraged him to increase hydration and continue with the prednisone taper which should help his blood sugar moving forward.  4. Follow-up: Will be in 4 weeks to check his CBC.   Richard Button, MD 9/20/20172:44 PM

## 2016-08-22 DIAGNOSIS — R739 Hyperglycemia, unspecified: Secondary | ICD-10-CM | POA: Diagnosis not present

## 2016-08-22 DIAGNOSIS — R7303 Prediabetes: Secondary | ICD-10-CM | POA: Diagnosis not present

## 2016-09-12 DIAGNOSIS — M159 Polyosteoarthritis, unspecified: Secondary | ICD-10-CM | POA: Diagnosis not present

## 2016-09-12 DIAGNOSIS — I10 Essential (primary) hypertension: Secondary | ICD-10-CM | POA: Diagnosis not present

## 2016-09-19 ENCOUNTER — Telehealth: Payer: Self-pay | Admitting: *Deleted

## 2016-09-19 DIAGNOSIS — I10 Essential (primary) hypertension: Secondary | ICD-10-CM | POA: Diagnosis not present

## 2016-09-19 DIAGNOSIS — Z299 Encounter for prophylactic measures, unspecified: Secondary | ICD-10-CM | POA: Diagnosis not present

## 2016-09-19 DIAGNOSIS — D589 Hereditary hemolytic anemia, unspecified: Secondary | ICD-10-CM | POA: Diagnosis not present

## 2016-09-19 NOTE — Telephone Encounter (Signed)
Son-in-law calling to say patient came off prednisone 2 days ago and is feeling sluggish,sickly, did spike a fever of 100 but is 98.7 now.  Encouraged them to keep regularly scheduled appt with dr Alen Blew tomorrow. Okay to take tylenol for fever.

## 2016-09-20 ENCOUNTER — Other Ambulatory Visit (HOSPITAL_BASED_OUTPATIENT_CLINIC_OR_DEPARTMENT_OTHER): Payer: Medicare Other

## 2016-09-20 ENCOUNTER — Ambulatory Visit (HOSPITAL_BASED_OUTPATIENT_CLINIC_OR_DEPARTMENT_OTHER): Payer: Medicare Other | Admitting: Oncology

## 2016-09-20 VITALS — BP 139/60 | HR 80 | Temp 98.0°F | Resp 20 | Ht 72.0 in | Wt 191.4 lb

## 2016-09-20 DIAGNOSIS — R161 Splenomegaly, not elsewhere classified: Secondary | ICD-10-CM | POA: Diagnosis not present

## 2016-09-20 DIAGNOSIS — D588 Other specified hereditary hemolytic anemias: Secondary | ICD-10-CM

## 2016-09-20 DIAGNOSIS — D598 Other acquired hemolytic anemias: Secondary | ICD-10-CM

## 2016-09-20 LAB — CBC WITH DIFFERENTIAL/PLATELET
BASO%: 0.2 % (ref 0.0–2.0)
Basophils Absolute: 0 10*3/uL (ref 0.0–0.1)
EOS ABS: 0.1 10*3/uL (ref 0.0–0.5)
EOS%: 1.3 % (ref 0.0–7.0)
HCT: 30.4 % — ABNORMAL LOW (ref 38.4–49.9)
HEMOGLOBIN: 10.8 g/dL — AB (ref 13.0–17.1)
LYMPH%: 27.1 % (ref 14.0–49.0)
MCH: 32.2 pg (ref 27.2–33.4)
MCHC: 35.5 g/dL (ref 32.0–36.0)
MCV: 90.7 fL (ref 79.3–98.0)
MONO#: 0.8 10*3/uL (ref 0.1–0.9)
MONO%: 10 % (ref 0.0–14.0)
NEUT%: 61.4 % (ref 39.0–75.0)
NEUTROS ABS: 5.1 10*3/uL (ref 1.5–6.5)
Platelets: 145 10*3/uL (ref 140–400)
RBC: 3.35 10*6/uL — AB (ref 4.20–5.82)
RDW: 14.1 % (ref 11.0–14.6)
WBC: 8.3 10*3/uL (ref 4.0–10.3)
lymph#: 2.3 10*3/uL (ref 0.9–3.3)

## 2016-09-20 NOTE — Progress Notes (Signed)
Hematology and Oncology Follow Up Visit  Richard Evans [id number] [birth year] 70 y.o. 09/20/2016 3:47 PM Richard Evans, MDVias, [name], MD   Principle Diagnosis: 70 year old gentleman with autoimmune hemolytic anemia diagnosed on July 30 of 2017. He presented with hemoglobin of 7.5, MCV 106 with elevated reticulocyte count and haptoglobin less than 10. Coombs testing was positive. CT scan of the chest abdomen and pelvis showed nonspecific hilar adenopathy.   Prior Therapy:   Status post packed red cell transfusion for 1 unit on 07/04/2016. Prednisone 70 mg daily started on 06/30/2016. He achieved complete response and prednisone tapered completely off on 09/11/2016.  Current therapy: Under consideration for salvage therapy.  Interim History:  Richard Evans presents today for a follow-up visit. Since the last visit, he completely finished steroid taper and have tolerated it reasonably well although he did develop hypoglycemia associated with it. He is incompletely off prednisone the last week and started developing symptoms of fatigue and tiredness. His symptoms resemble his initial presentation back in July.  He does not report any bleeding. He denied any lower extremity edema, weight gain or any facial swelling.   He is also reporting back pain mid scapular and location and not associated with any trauma. He does not report any constitutional symptoms including fevers or chills or weight loss. His performance status have been reasonable although decline in the last week. His blood sugars have been monitored frequently and have been controlled with oral hypoglycemic agents.   He does not report any headaches, blurry vision, syncope or seizures. He is not report any fevers or chills or weight loss. He does not report any chest pain, palpitation, orthopnea or leg edema. He does not report any cough, wheezing or hemoptysis. He does not report any nausea, vomiting or abdominal pain. He does not report any  frequency urgency or hesitancy. He has not reported any skeletal complaints. Remaining review of systems unremarkable   Medications: I have reviewed the patient's current medications.  Current Outpatient Prescriptions  Medication Sig Dispense Refill  . aspirin EC 81 MG EC tablet Take 1 tablet (81 mg total) by mouth daily. 30 tablet 0  . atenolol (TENORMIN) 25 MG tablet Take 25 mg by mouth at bedtime.     Marland Kitchen atorvastatin (LIPITOR) 10 MG tablet Take 10 mg by mouth daily.    . diphenhydrAMINE (BENADRYL) 25 MG tablet Take 25 mg by mouth at bedtime.     Marland Kitchen glipiZIDE (GLUCOTROL XL) 5 MG 24 hr tablet     . Magnesium 500 MG TABS Take 500 mg by mouth daily as needed (FOR CRAMPING).    . Potassium Gluconate 595 MG CAPS Take 595 mg by mouth daily as needed (FOR CRAMPING).    . Tamsulosin HCl (FLOMAX) 0.4 MG CAPS Take 0.4 mg by mouth daily after supper.      No current facility-administered medications for this visit.      Allergies:  Allergies  Allergen Reactions  . Adhesive [Tape] Itching and Rash  . Prednisone Other (See Comments)    Chest tightness in past. Tolerated solumedrol well, so retried d/t medical necessity. Tolerating well during inpatient stay in August 2017    Past Medical History, Surgical history, Social history, and Family History were reviewed and updated.  Physical Exam: Blood pressure 139/60, pulse 80, temperature 98 F (36.7 C), temperature source Oral, resp. rate 20, height 6' (1.829 m), weight 191 lb 6.4 oz (86.8 kg), SpO2 91 %. ECOG: 0 General appearance: Alert, awake gentleman  appeared without distress.  Head: Normocephalic, without obvious abnormality nopitting dullness or ascites.  Neck: no adenopathy Lymph nodes: Cervical, supraclavicular, and axillary nodes normal. Heart:regular rate and rhythm, S1, S2 normal, no murmur, click, rub or gallop Lung:chest clear, no wheezing, rales, normal symmetric air entry.  Abdomin: soft, non-tender, without masses or  organomegalyI appreciate any splenomegaly.  EXT: No edema or erythema.   Lab Results: Lab Results  Component Value Date   WBC 8.3 09/20/2016   HGB 10.8 (L) 09/20/2016   HCT 30.4 (L) 09/20/2016   MCV 90.7 09/20/2016   PLT 145 09/20/2016     Chemistry      Component Value Date/Time   NA 135 (L) 08/21/2016 1418   K 4.9 08/21/2016 1418   CL 106 07/09/2016 0436   CO2 25 08/21/2016 1418   BUN 22.1 08/21/2016 1418   CREATININE 1.2 08/21/2016 1418      Component Value Date/Time   CALCIUM 9.1 08/21/2016 1418   ALKPHOS 58 08/21/2016 1418   AST 19 08/21/2016 1418   ALT 43 08/21/2016 1418   BILITOT 1.95 (H) 08/21/2016 1418       Impression and Plan:   70 year old gentleman with the following issues:  1. Autoimmune hemolytic anemia presented with hemoglobin of 7.5 and positive Coombs testing. He was diagnosed In 07/30/2017currently on prednisone 70 mg daily.  His hemoglobin has normalized Initially on full dose prednisone. However, he has been tapered off completely and could not tolerate the taper with his hemoglobin is drifting down indicating possible relapse.  Treatment options were reviewed today which including restarting the prednisone at a higher doses although you've had complications associated with that related to hyperglycemia. Other options would include rituximab versus splenectomy.  The risks and benefits of both approaches approaches were discussed extensively with the patient and his family. Complications associated with the Rituxan include infusion related complications, nausea, fatigue as well as rarely anaphylaxis associated with infusion. After discussion today, he is agreeable to proceed with rituximab treatments. He will receive it weekly infusion of a total dose of 375 mg/m for a total of 4 weeks. We will check his hemoglobin periodically on this treatment. If he fails this treatment, splenectomy would be next.   2. Superficial phlebitis and cellulitis of  the right arm: Resolved at this time without any relapse.  3. Hyperglycemia: Managed by his primary care physician currently on oral hypoglycemic agent.  4. Splenomegaly: No clear-cut evidence of lymphoproliferative disorder detected on previous imaging studies but does have an element of splenomegaly. Scar be related to his autoimmune hemolysis. He is experiencing back pain which could be related to an enlarging spleen or possible lymphadenopathy. Like to obtain a CT scan of the abdomen and pelvis to rule out any lymphadenopathy or worsening splenomegaly.  5. Follow-up: Will be in one week to start rituximab infusion.   Zola Button, MD 10/20/20173:47 PM

## 2016-09-23 ENCOUNTER — Telehealth: Payer: Self-pay | Admitting: *Deleted

## 2016-09-23 DIAGNOSIS — D589 Hereditary hemolytic anemia, unspecified: Secondary | ICD-10-CM | POA: Diagnosis not present

## 2016-09-23 DIAGNOSIS — R918 Other nonspecific abnormal finding of lung field: Secondary | ICD-10-CM | POA: Diagnosis not present

## 2016-09-23 DIAGNOSIS — R789 Finding of unspecified substance, not normally found in blood: Secondary | ICD-10-CM | POA: Diagnosis not present

## 2016-09-23 DIAGNOSIS — R079 Chest pain, unspecified: Secondary | ICD-10-CM | POA: Diagnosis not present

## 2016-09-23 DIAGNOSIS — R279 Unspecified lack of coordination: Secondary | ICD-10-CM | POA: Diagnosis not present

## 2016-09-23 DIAGNOSIS — R042 Hemoptysis: Secondary | ICD-10-CM | POA: Diagnosis not present

## 2016-09-23 DIAGNOSIS — I700 Atherosclerosis of aorta: Secondary | ICD-10-CM | POA: Diagnosis not present

## 2016-09-23 DIAGNOSIS — R5383 Other fatigue: Secondary | ICD-10-CM | POA: Diagnosis not present

## 2016-09-23 DIAGNOSIS — Z299 Encounter for prophylactic measures, unspecified: Secondary | ICD-10-CM | POA: Diagnosis not present

## 2016-09-23 DIAGNOSIS — R590 Localized enlarged lymph nodes: Secondary | ICD-10-CM | POA: Diagnosis not present

## 2016-09-23 DIAGNOSIS — R350 Frequency of micturition: Secondary | ICD-10-CM | POA: Diagnosis not present

## 2016-09-23 DIAGNOSIS — I2510 Atherosclerotic heart disease of native coronary artery without angina pectoris: Secondary | ICD-10-CM | POA: Diagnosis not present

## 2016-09-23 NOTE — Telephone Encounter (Signed)
TC from patient's wife this afternoon.  She states that her husband has felt poorly all weekend, having fevers and chills. Patient saw his PCP today and had a chest CT done. According to the patient's wife, this scan revealed pneumonia in right upper lobe. Pt had been having pain in the right upper chest and right shoulder. Wife states he is too weak to attend chemo education class on Wednesday or to have Rituxan on Friday.  She is also asking if he should have Abdomen, pelvis CT scan prior to starting Rituxan.  Please call to discuss with wife tomorrow 09/24/16.  appts for 10/25 and 10/27 cancelled per wife request.

## 2016-09-23 NOTE — Telephone Encounter (Signed)
Per LOS I have scheduled appt and notified the scheduler. No available on 10/26, moved to 10/27

## 2016-09-24 ENCOUNTER — Encounter: Payer: Self-pay | Admitting: Pulmonary Disease

## 2016-09-24 ENCOUNTER — Ambulatory Visit (INDEPENDENT_AMBULATORY_CARE_PROVIDER_SITE_OTHER): Payer: Medicare Other | Admitting: Pulmonary Disease

## 2016-09-24 ENCOUNTER — Other Ambulatory Visit: Payer: Medicare Other

## 2016-09-24 VITALS — BP 138/66 | HR 74 | Ht 72.0 in | Wt 191.6 lb

## 2016-09-24 DIAGNOSIS — J181 Lobar pneumonia, unspecified organism: Secondary | ICD-10-CM

## 2016-09-24 DIAGNOSIS — J432 Centrilobular emphysema: Secondary | ICD-10-CM

## 2016-09-24 DIAGNOSIS — R590 Localized enlarged lymph nodes: Secondary | ICD-10-CM | POA: Diagnosis not present

## 2016-09-24 DIAGNOSIS — J189 Pneumonia, unspecified organism: Secondary | ICD-10-CM | POA: Insufficient documentation

## 2016-09-24 DIAGNOSIS — J439 Emphysema, unspecified: Secondary | ICD-10-CM | POA: Insufficient documentation

## 2016-09-24 NOTE — Assessment & Plan Note (Signed)
Lymphadenopathy with splenomegaly raises question of underlying lymphoproliferative diseaseespeciallyinthesettingofautoimmunehemolyticanemia.  We'll repeat CT scan in 3 months with contrast-if persistent will discuss with oncology whether biopsy would be required.

## 2016-09-24 NOTE — Progress Notes (Signed)
Subjective:    Patient ID: Richard Evans, male    DOB: Feb 06, 1946, 70 y.o.   MRN: [id number]  HPI  Chief Complaint  Patient presents with  . Advice Only    Referred by Dr. Woody Seller for RUL mass, hemoptysis.      70 year old ex heavy smoker referred for evaluation of hemoptysis and abnormal CT imaging.  He was diagnosed with autoimmune hemolytic anemia in 06/2016 and sees Dr. Jacques Earthly and received a course of high-dose steroids. CT imaging then showed mediastinal lymphadenopathy and splenomegaly and a lymphoproliferative disorder was considered but not diagnosed. He completed his steroid course 2 weeks ago. He was at the [facility] for a few weeks and [place] a few nights with his wife. About 5 days ago he developed sudden onset right-sided pleuritic chest pain radiating to his back and low-grade fevers to about 100 with night sweats-his PCP called in ciprofloxacin over the weekend and then on a office visit he was placed on Levaquin which is just started taking today. He reports one episode of red blood tinged sputum yesterday. He has a remote history of epistaxis with this has not recurred.  His hemoglobin seems to have dropped from a value of 12-9.7 over the past few weeks.  CT imaging was repeated at more head and this showed underlying centrilobular emphysema and right upper lobe airspace disease which was new when compared to his CT from 06/2016 . hence the referral  His fever has improved, pain is improving-over the last 24 hours He denies significant dyspnea he denies wheezing or frequent chest colds, he does report a daily morning cough productive of clear sputum. He smoked about a pack per day until 2010 when he quit-about 50-pack-years.  I have reviewed all his imaging studies     Past Medical History:  Diagnosis Date  . Hemolytic anemia ([place])   . History of epistaxis    hx of epistaxis on ASA.[facility]  . Hypertension   . Vocal cord nodule    benign      Past Surgical History:  Procedure Laterality Date  . COLONOSCOPY  12/96   Dr. Rehman>diverticulosis/ external hemorrhoids  . THROAT SURGERY  1977   nodules removed from larnyx     Allergies  Allergen Reactions  . Adhesive [Tape] Itching and Rash     Social History   Social History  . Marital status: Single    Spouse name: N/A  . Number of children: N/A  . Years of education: N/A   Occupational History  . Not on file.   Social History Main Topics  . Smoking status: Former Smoker    Packs/day: 2.00    Years: 50.00    Types: Cigarettes    Quit date: 11/30/2009  . Smokeless tobacco: Never Used  . Alcohol use No  . Drug use: No  . Sexual activity: Yes    Birth control/ protection: None   Other Topics Concern  . Not on file   Social History Narrative  . No narrative on file    Family History  Problem Relation Age of Onset  . Brain cancer Mother   . Heart attack Father   . Brain cancer Brother   . Cancer - Lung Brother   . Stroke Brother      Review of Systems  Constitutional: Negative for fever and unexpected weight change.  HENT: Positive for congestion. Negative for dental problem, ear pain, nosebleeds, postnasal drip, rhinorrhea, sinus pressure, sneezing, sore throat  and trouble swallowing.   Eyes: Negative for redness and itching.  Respiratory: Positive for cough, chest tightness and shortness of breath. Negative for wheezing.   Cardiovascular: Negative for palpitations and leg swelling.  Gastrointestinal: Negative for nausea and vomiting.  Genitourinary: Negative for dysuria.  Musculoskeletal: Negative for joint swelling.  Skin: Negative for rash.  Neurological: Negative for headaches.  Hematological: Does not bruise/bleed easily.  Psychiatric/Behavioral: Negative for dysphoric mood. The patient is not nervous/anxious.        Objective:   Physical Exam  Gen. Pleasant, well-nourished, in no distress, normal affect ENT - no lesions, no  post nasal drip Neck: No JVD, no thyromegaly, no carotid bruits Lungs: no use of accessory muscles, no dullness to percussion, RT Suprascapular rales, no rhonchi  Cardiovascular: Rhythm regular, heart sounds  normal, no murmurs or gallops, no peripheral edema Abdomen: soft and non-tender, no hepatosplenomegaly, BS normal. Musculoskeletal: No deformities, no cyanosis or clubbing Neuro:  alert, non focal       Assessment & Plan:

## 2016-09-24 NOTE — Assessment & Plan Note (Signed)
Noted on CT imaging PFTs in the future once acute issues resolved Presence of pneumonia would be a contraindication for Rituxan

## 2016-09-24 NOTE — Patient Instructions (Signed)
You have pneumonia in the right upper lung-complete antibiotics for 10 days Then make appointment with Dr. Woody Seller for chest x-ray  Call me if fevers return or if more blood in sputum  Repeat CT scan of chest with contrast in 3 months to check on enlarged lymph glands

## 2016-09-24 NOTE — Assessment & Plan Note (Signed)
Fever and pleuritic chest pain with new airspace disease in the right upper lobe are suggestive, required pneumonia-he does seem to be responding to antibiotics-would continue Levaquin for 10 days and then repeat chest x-ray for improvement/resolution. He prefers to have repeat chest x-ray done at his PCPs office

## 2016-09-25 ENCOUNTER — Ambulatory Visit (HOSPITAL_COMMUNITY)
Admission: RE | Admit: 2016-09-25 | Discharge: 2016-09-25 | Disposition: A | Discharge status: Home / Self Care | Payer: Medicare Other | Source: Ambulatory Visit | Attending: Oncology | Admitting: Oncology

## 2016-09-25 ENCOUNTER — Encounter (HOSPITAL_COMMUNITY): Payer: Self-pay

## 2016-09-25 DIAGNOSIS — K5730 Diverticulosis of large intestine without perforation or abscess without bleeding: Secondary | ICD-10-CM

## 2016-09-25 DIAGNOSIS — R161 Splenomegaly, not elsewhere classified: Secondary | ICD-10-CM | POA: Insufficient documentation

## 2016-09-25 DIAGNOSIS — D589 Hereditary hemolytic anemia, unspecified: Secondary | ICD-10-CM

## 2016-09-25 DIAGNOSIS — J189 Pneumonia, unspecified organism: Secondary | ICD-10-CM | POA: Diagnosis not present

## 2016-09-25 DIAGNOSIS — I10 Essential (primary) hypertension: Secondary | ICD-10-CM | POA: Diagnosis not present

## 2016-09-25 DIAGNOSIS — D649 Anemia, unspecified: Secondary | ICD-10-CM | POA: Diagnosis not present

## 2016-09-25 DIAGNOSIS — D588 Other specified hereditary hemolytic anemias: Secondary | ICD-10-CM

## 2016-09-25 DIAGNOSIS — M6281 Muscle weakness (generalized): Secondary | ICD-10-CM | POA: Diagnosis not present

## 2016-09-25 DIAGNOSIS — N400 Enlarged prostate without lower urinary tract symptoms: Secondary | ICD-10-CM | POA: Diagnosis not present

## 2016-09-25 DIAGNOSIS — Z91048 Other nonmedicinal substance allergy status: Secondary | ICD-10-CM | POA: Diagnosis not present

## 2016-09-25 MED ORDER — IOPAMIDOL (ISOVUE-300) INJECTION 61%
100.0000 mL | Freq: Once | INTRAVENOUS | Status: AC | PRN
  Administered 2016-09-25: 100 mL via INTRAVENOUS

## 2016-09-26 ENCOUNTER — Telehealth: Payer: Self-pay | Admitting: *Deleted

## 2016-09-26 NOTE — Telephone Encounter (Signed)
CT scan discussed with the patient and his wife. He is to keep his appointment for Rituxan in 11/2.

## 2016-09-26 NOTE — Telephone Encounter (Signed)
Wife Juliann Pulse called requesting results of CT scan done yesterday 09/25/16.   Message routed to Dr. Alen Blew, and desk nurse. Kathy's    Cell      [phone].

## 2016-09-27 ENCOUNTER — Encounter (HOSPITAL_COMMUNITY): Payer: Self-pay

## 2016-09-27 ENCOUNTER — Inpatient Hospital Stay (HOSPITAL_COMMUNITY)
Admission: EM | Admit: 2016-09-27 | Discharge: 2016-10-02 | DRG: 811 | Disposition: A | Discharge status: Home / Self Care | Payer: Medicare Other | Attending: Family Medicine | Admitting: Family Medicine

## 2016-09-27 ENCOUNTER — Ambulatory Visit: Payer: Medicare Other

## 2016-09-27 ENCOUNTER — Observation Stay (HOSPITAL_COMMUNITY): Payer: Medicare Other

## 2016-09-27 DIAGNOSIS — N400 Enlarged prostate without lower urinary tract symptoms: Secondary | ICD-10-CM | POA: Diagnosis not present

## 2016-09-27 DIAGNOSIS — Z7982 Long term (current) use of aspirin: Secondary | ICD-10-CM

## 2016-09-27 DIAGNOSIS — R0602 Shortness of breath: Secondary | ICD-10-CM

## 2016-09-27 DIAGNOSIS — Y9289 Other specified places as the place of occurrence of the external cause: Secondary | ICD-10-CM

## 2016-09-27 DIAGNOSIS — Z87891 Personal history of nicotine dependence: Secondary | ICD-10-CM

## 2016-09-27 DIAGNOSIS — M6281 Muscle weakness (generalized): Secondary | ICD-10-CM

## 2016-09-27 DIAGNOSIS — I10 Essential (primary) hypertension: Secondary | ICD-10-CM | POA: Diagnosis present

## 2016-09-27 DIAGNOSIS — R739 Hyperglycemia, unspecified: Secondary | ICD-10-CM | POA: Diagnosis present

## 2016-09-27 DIAGNOSIS — Z7952 Long term (current) use of systemic steroids: Secondary | ICD-10-CM

## 2016-09-27 DIAGNOSIS — Z79899 Other long term (current) drug therapy: Secondary | ICD-10-CM

## 2016-09-27 DIAGNOSIS — E162 Hypoglycemia, unspecified: Secondary | ICD-10-CM | POA: Diagnosis not present

## 2016-09-27 DIAGNOSIS — D589 Hereditary hemolytic anemia, unspecified: Secondary | ICD-10-CM | POA: Diagnosis not present

## 2016-09-27 DIAGNOSIS — Z91048 Other nonmedicinal substance allergy status: Secondary | ICD-10-CM

## 2016-09-27 DIAGNOSIS — D588 Other specified hereditary hemolytic anemias: Secondary | ICD-10-CM | POA: Diagnosis not present

## 2016-09-27 DIAGNOSIS — D649 Anemia, unspecified: Secondary | ICD-10-CM

## 2016-09-27 DIAGNOSIS — T380X5A Adverse effect of glucocorticoids and synthetic analogues, initial encounter: Secondary | ICD-10-CM | POA: Diagnosis present

## 2016-09-27 DIAGNOSIS — J189 Pneumonia, unspecified organism: Secondary | ICD-10-CM | POA: Diagnosis present

## 2016-09-27 DIAGNOSIS — I700 Atherosclerosis of aorta: Secondary | ICD-10-CM | POA: Diagnosis present

## 2016-09-27 HISTORY — DX: Pneumonia, unspecified organism: J18.9

## 2016-09-27 LAB — CBC
HEMATOCRIT: 24.7 % — AB (ref 39.0–52.0)
HEMOGLOBIN: 8.3 g/dL — AB (ref 13.0–17.0)
MCH: 31.2 pg (ref 26.0–34.0)
MCHC: 33.6 g/dL (ref 30.0–36.0)
MCV: 92.9 fL (ref 78.0–100.0)
Platelets: 275 10*3/uL (ref 150–400)
RBC: 2.66 MIL/uL — AB (ref 4.22–5.81)
RDW: 21.2 % — ABNORMAL HIGH (ref 11.5–15.5)
WBC: 12.7 10*3/uL — ABNORMAL HIGH (ref 4.0–10.5)

## 2016-09-27 LAB — IRON AND TIBC
IRON: 88 ug/dL (ref 45–182)
Saturation Ratios: 33 % (ref 17.9–39.5)
TIBC: 269 ug/dL (ref 250–450)
UIBC: 181 ug/dL

## 2016-09-27 LAB — DIC (DISSEMINATED INTRAVASCULAR COAGULATION) PANEL
INR: 1.19
PLATELETS: 292 10*3/uL (ref 150–400)

## 2016-09-27 LAB — RETICULOCYTES
RBC.: 2.63 MIL/uL — AB (ref 4.22–5.81)
RETIC COUNT ABSOLUTE: 549.7 10*3/uL — AB (ref 19.0–186.0)
RETIC CT PCT: 20.9 % — AB (ref 0.4–3.1)

## 2016-09-27 LAB — BASIC METABOLIC PANEL
Anion gap: 7 (ref 5–15)
BUN: 18 mg/dL (ref 6–20)
CHLORIDE: 112 mmol/L — AB (ref 101–111)
CO2: 22 mmol/L (ref 22–32)
CREATININE: 0.89 mg/dL (ref 0.61–1.24)
Calcium: 8.8 mg/dL — ABNORMAL LOW (ref 8.9–10.3)
GFR calc Af Amer: >60 mL/min (ref >60)
GFR calc non Af Amer: >60 mL/min (ref >60)
GLUCOSE: 166 mg/dL — AB (ref 65–99)
POTASSIUM: 4.1 mmol/L (ref 3.5–5.1)
SODIUM: 141 mmol/L (ref 135–145)

## 2016-09-27 LAB — DIC (DISSEMINATED INTRAVASCULAR COAGULATION)PANEL
D-Dimer, Quant: 1.75 ug/mL-FEU — ABNORMAL HIGH (ref 0.00–0.50)
Fibrinogen: 604 mg/dL — ABNORMAL HIGH (ref 210–475)
Prothrombin Time: 15.2 seconds (ref 11.4–15.2)
Smear Review: NONE SEEN
aPTT: 42 seconds — ABNORMAL HIGH (ref 24–36)

## 2016-09-27 LAB — SAVE SMEAR

## 2016-09-27 LAB — FOLATE: FOLATE: 20.7 ng/mL (ref >5.9)

## 2016-09-27 LAB — OCCULT BLOOD, POC DEVICE: Fecal Occult Bld: NEGATIVE

## 2016-09-27 LAB — LACTATE DEHYDROGENASE: LDH: 508 U/L — ABNORMAL HIGH (ref 98–192)

## 2016-09-27 LAB — HEMOGLOBIN AND HEMATOCRIT, BLOOD
HCT: 21.7 % — ABNORMAL LOW (ref 39.0–52.0)
HEMATOCRIT: 24.9 % — AB (ref 39.0–52.0)
Hemoglobin: 8.3 g/dL — ABNORMAL LOW (ref 13.0–17.0)
Hemoglobin: 7.2 g/dL — ABNORMAL LOW (ref 13.0–17.0)

## 2016-09-27 LAB — FERRITIN: Ferritin: 997 ng/mL — ABNORMAL HIGH (ref 24–336)

## 2016-09-27 LAB — VITAMIN B12: VITAMIN B 12: 296 pg/mL (ref 180–914)

## 2016-09-27 MED ORDER — PREDNISONE 20 MG PO TABS
80.0000 mg | ORAL_TABLET | Freq: Once | ORAL | Status: AC
  Administered 2016-09-27: 80 mg via ORAL
  Filled 2016-09-27: qty 4

## 2016-09-27 MED ORDER — ONDANSETRON HCL 4 MG PO TABS: 4.0000 mg | ORAL_TABLET | Freq: Four times a day (QID) | ORAL | Status: DC | PRN

## 2016-09-27 MED ORDER — ATENOLOL 50 MG PO TABS
25.0000 mg | ORAL_TABLET | Freq: Every day | ORAL | Status: DC
Start: 2016-09-27 — End: 2016-10-02
  Administered 2016-09-27 – 2016-10-01 (×5): 25 mg via ORAL
  Filled 2016-09-27 (×5): qty 1

## 2016-09-27 MED ORDER — ONDANSETRON HCL 4 MG/2ML IJ SOLN: 4.0000 mg | Freq: Four times a day (QID) | INTRAMUSCULAR | Status: DC | PRN

## 2016-09-27 MED ORDER — ACETAMINOPHEN 650 MG RE SUPP: 650.0000 mg | Freq: Four times a day (QID) | RECTAL | Status: DC | PRN

## 2016-09-27 MED ORDER — ACETAMINOPHEN 325 MG PO TABS: 650.0000 mg | ORAL_TABLET | Freq: Four times a day (QID) | ORAL | Status: DC | PRN

## 2016-09-27 MED ORDER — DIPHENHYDRAMINE HCL 25 MG PO CAPS
25.0000 mg | ORAL_CAPSULE | Freq: Every day | ORAL | Status: DC
Start: 2016-09-27 — End: 2016-10-02
  Administered 2016-09-27 – 2016-10-01 (×5): 25 mg via ORAL
  Filled 2016-09-27 (×5): qty 1

## 2016-09-27 MED ORDER — ALBUTEROL SULFATE (2.5 MG/3ML) 0.083% IN NEBU: 2.5000 mg | INHALATION_SOLUTION | RESPIRATORY_TRACT | Status: DC | PRN

## 2016-09-27 MED ORDER — PREDNISONE 50 MG PO TABS
80.0000 mg | ORAL_TABLET | Freq: Every day | ORAL | Status: DC
  Administered 2016-09-28: 80 mg via ORAL
  Filled 2016-09-27: qty 1

## 2016-09-27 MED ORDER — SODIUM CHLORIDE 0.9 % IV SOLN
INTRAVENOUS | Status: DC
  Administered 2016-09-27 – 2016-09-29 (×4): via INTRAVENOUS

## 2016-09-27 MED ORDER — ZOLPIDEM TARTRATE 5 MG PO TABS: 5.0000 mg | ORAL_TABLET | Freq: Every evening | ORAL | Status: DC | PRN

## 2016-09-27 MED ORDER — TAMSULOSIN HCL 0.4 MG PO CAPS
0.4000 mg | ORAL_CAPSULE | Freq: Every day | ORAL | Status: DC
  Administered 2016-09-27 – 2016-10-01 (×5): 0.4 mg via ORAL
  Filled 2016-09-27 (×5): qty 1

## 2016-09-27 NOTE — ED Notes (Signed)
MD at beside

## 2016-09-27 NOTE — Progress Notes (Signed)
Report was received from [facility].

## 2016-09-27 NOTE — ED Notes (Addendum)
Pt ambulated to restroom from room, tolerated well. Pt stated while ambulating that he felt short of breath. Placed pt back onto monitor O2 read at 84%. After few mins. O2 read 94%. Nurse was notified.

## 2016-09-27 NOTE — ED Notes (Signed)
Patient stable and stable for transport.  Belongings taken to floor with patient.  Report was called to [employer] RN

## 2016-09-27 NOTE — H&P (Signed)
[facility] History and Physical  Richard Evans AJO:[id number] DOB: 08-Jan-1946 DOA: 09/27/2016  Referring physician:  PCP: Richard Chroman, MD   Chief Complaint: "I just get tired so easily."  HPI: Richard Evans is a 70 y.o. male with past medical history of hypertension and hemolytic anemia presents to emergency room with chief complaint of fatigue. Patient recently diagnosed with hemolytic anemia a few months ago. Was on high-dose steroids and recently tapered off. Recently completely off high-dose oral prednisone as of 2 weeks ago. Of note patient also recently diagnosed with pneumonia. Patient is followed by hematology oncology. They went to start patient on Rituxan but he was resistant due to his pneumonia. Given patient's gradual physical decline and we brought him to the emergency room for evaluation.  Denies fevers chills nausea vomiting.  ED course: Patient was given 80 mg of prednisone. Then the hospitalist for consult for admission.  Review of Systems:  As per HPI otherwise 10 point review of systems negative.    Past Medical History:  Diagnosis Date  . Hemolytic anemia ([place])   . History of epistaxis    hx of epistaxis on ASA.[facility]  . Hypertension   . Pneumonia   . Vocal cord nodule    benign   Past Surgical History:  Procedure Laterality Date  . COLONOSCOPY  12/96   Dr. Rehman>diverticulosis/ external hemorrhoids  . THROAT SURGERY  1977   nodules removed from larnyx   Social History:  reports that he quit smoking about 6 years ago. His smoking use included Cigarettes. He has a 100.00 pack-year smoking history. He has never used smokeless tobacco. He reports that he does not drink alcohol or use drugs.  Allergies  Allergen Reactions  . Adhesive [Tape] Itching and Rash    Family History  Problem Relation Age of Onset  . Brain cancer Mother   . Heart attack Father   . Brain cancer Brother   . Cancer - Lung Brother   . Stroke  Brother      Prior to Admission medications   Medication Sig Start Date End Date Taking? Authorizing Provider  aspirin EC 81 MG EC tablet Take 1 tablet (81 mg total) by mouth daily. 07/09/16   Thurnell Lose, MD  atenolol (TENORMIN) 25 MG tablet Take 25 mg by mouth at bedtime.     Historical Provider, MD  atorvastatin (LIPITOR) 10 MG tablet Take 10 mg by mouth daily.    Historical Provider, MD  diphenhydrAMINE (BENADRYL) 25 MG tablet Take 25 mg by mouth at bedtime.     Historical Provider, MD  glipiZIDE (GLUCOTROL XL) 5 MG 24 hr tablet Take 5 mg by mouth daily as needed.  09/19/16   Historical Provider, MD  levofloxacin (LEVAQUIN) 500 MG tablet Take 500 mg by mouth daily.    Historical Provider, MD  Magnesium 500 MG TABS Take 500 mg by mouth daily as needed (FOR CRAMPING).    Historical Provider, MD  Potassium Gluconate 595 MG CAPS Take 595 mg by mouth daily as needed (FOR CRAMPING).    Historical Provider, MD  Tamsulosin HCl (FLOMAX) 0.4 MG CAPS Take 0.4 mg by mouth daily after supper.     Historical Provider, MD   Physical Exam: Vitals:   09/27/16 1334 09/27/16 1339 09/27/16 1530 09/27/16 1600  BP: 105/61  129/76 145/73  Pulse: 74  71 77  Resp: 15     Temp: 98.3 F (36.8 C)     TempSrc:  Oral     SpO2: 98%  97% 94%  Weight:  87.5 kg (193 lb)    Height:  6' (1.829 m)      Wt Readings from Last 3 Encounters:  09/27/16 87.5 kg (193 lb)  09/24/16 86.9 kg (191 lb 9.6 oz)  09/20/16 86.8 kg (191 lb 6.4 oz)    General:  Appears calm and comfortable Eyes:  PERRL, EOMI, normal lids, iris ENT:  grossly normal hearing, lips & tongue Neck:  no LAD, masses or thyromegaly Cardiovascular:  RRR, no m/r/g. No LE edema.  Respiratory:  CTA bilaterally, no w/r/r. Normal respiratory effort. Abdomen:  soft, ntnd Skin:  no rash or induration seen on limited exam Musculoskeletal:  grossly normal tone BUE/BLE Psychiatric:  grossly normal mood and affect, speech fluent and appropriate Neurologic:   CN 2-12 grossly intact, moves all extremities in coordinated fashion. alert and oriented 3.           Labs on Admission:  Basic Metabolic Panel:  Recent Labs Lab 09/27/16 1342  NA 141  K 4.1  CL 112*  CO2 22  GLUCOSE 166*  BUN 18  CREATININE 0.89  CALCIUM 8.8*   Liver Function Tests: No results for input(s): AST, ALT, ALKPHOS, BILITOT, PROT, ALBUMIN in the last 168 hours. No results for input(s): LIPASE, AMYLASE in the last 168 hours. No results for input(s): AMMONIA in the last 168 hours. CBC:  Recent Labs Lab 09/27/16 1342 09/27/16 1543  WBC 12.7*  --   HGB 8.3*  --   HCT 24.7*  --   MCV 92.9  --   PLT 275 292   Cardiac Enzymes: No results for input(s): CKTOTAL, CKMB, CKMBINDEX, TROPONINI in the last 168 hours.  BNP (last 3 results)  Recent Labs  06/30/16 1200  BNP 163.2*    ProBNP (last 3 results) No results for input(s): PROBNP in the last 8760 hours.   Serum creatinine: 0.89 mg/dL 09/27/16 1342 Estimated creatinine clearance: 83.6 mL/min  CBG: No results for input(s): GLUCAP in the last 168 hours.  Radiological Exams on Admission: No results found.  EKG: n/a  Assessment/Plan Active Problems:   * No active hospital problems. *   Richard Evans is a 70 y.o. male presenting with SOBw/ exertion. PMH is significant for hemolytic anemia, HTN, h/o epistaxis w/anemia, diverticulosis.     Hemolytic Anemia - Admit to obs - Heme/onc op referral to Dr. Barbaraann Boys requested by family - Per heme/onc on prev admission, transfuse Hgb <7 and steroids: recommended prednisone '1mg'$ /kg up to '100mg'$ . - Patient given 80 mg emergency room. - LDH, haptoglobin, and retic count pending to confirm hemolytic anemia - type and screen ordered - monitor H/H to see if pt needs transfusion - type and screen ordered  - insert 2nd peripheral large bore IV  - ferritin, iron panel, folate, vitamin b12 pending  - blood smear   HTN at home on atenolol '25mg'$  qhs. BP  stable at admission.  - continue home med   BPH at home on flomax 0.'4mg'$  qd - continue home med   CAD found on CT imaging. Patient states has been on medication in the past, was taken off by PCP d/t good control.  - consider start atorvastatin '40mg'$  qd given risk factors -patient likely not a good candidate for daily ASA given h/o severe epistasis with ASA   Elevated WBC Pt has sob and recent pna after long term steroids Checking CXR to look for resolution of disease  Albuterol prn  Code Status: Full  DVT Prophylaxis: SCD Family Communication: at bedside Disposition Plan: Home    Elwin Mocha, MD Family Medicine [facility] [email or web address] Password [email or web address]

## 2016-09-27 NOTE — ED Triage Notes (Signed)
Per PT and family, pT is coming from PCP with hemoglobin stated to be 7.8. Pt has Hx of the same. Pt reports feeling fatigued and looks pale upon arrival. Denies source of bleeding, but report hemolyptic anemia. PT was hospitalized in July for the same thing. Currently, pt has diagnosis of pneumonia.

## 2016-09-27 NOTE — Progress Notes (Signed)
Pt arrived at the unit via bed, IV running at 75 ml NS. All vital signs are statble at this time.

## 2016-09-27 NOTE — ED Notes (Signed)
Blood Bank asks if they can be called if blood is to be transfused because he has rare antibodies the blood will need to be ordered from [place].

## 2016-09-27 NOTE — ED Provider Notes (Signed)
[facility] DEPT Provider Note   CSN: [id number] Arrival date & time: 09/27/16  1309     History   Chief Complaint Chief Complaint  Patient presents with  . Anemia    HPI Richard Evans is a 70 y.o. male.  HPI Pt has a history of hemolytic anemia earlier this year.  He was previously treated with transfusions and prednisone which  helped.  He stopped taking the prednisone about 3 weeks ago.  Since that time he has had progressive weakness and fatigue.  He has not noticed any blood in the stool.  No easy bruising.  The urine is looking darker again, brown in color.  He did see his lung doctor recently and was told he had pneumonia.  He is currently taking levaquin for that.  No temperatures greater than 99,100 but he felt chilled and feverish last weekend.  He saw his PCP today and was told to come to the hospital because his blood counts were lower.   Past Medical History:  Diagnosis Date  . Hemolytic anemia ([place])   . History of epistaxis    [facility]  . Hypertension   . Pneumonia   . Vocal cord nodule    benign    Patient Active Problem List   Diagnosis Date Noted  . CAP (community acquired pneumonia) 09/24/2016  . Mediastinal lymphadenopathy 09/24/2016  . Emphysema lung ([place]) 09/24/2016  . Superficial vein thrombosis 07/08/2016  . Cellulitis of right upper arm 07/08/2016  . Other fatigue   . SOB (shortness of breath) on exertion   . Essential hypertension   . Elevated bilirubin   . Hyperglycemia   . Leukocytosis   . Splenomegaly   . Jaundice   . Hemolytic anemia ([place]) 06/30/2016  . Anemia 06/30/2016  . ANEMIA, MILD 01/02/2011  . BLOOD IN STOOL, OCCULT 12/11/2010    Past Surgical History:  Procedure Laterality Date  . COLONOSCOPY  12/96   Dr. Rehman>diverticulosis/ external hemorrhoids  . THROAT SURGERY  1977   nodules removed from larnyx       Home Medications    Prior to Admission medications     Medication Sig Start Date End Date Taking? Authorizing Provider  aspirin EC 81 MG EC tablet Take 1 tablet (81 mg total) by mouth daily. 07/09/16   Thurnell Lose, MD  atenolol (TENORMIN) 25 MG tablet Take 25 mg by mouth at bedtime.     Historical Provider, MD  atorvastatin (LIPITOR) 10 MG tablet Take 10 mg by mouth daily.    Historical Provider, MD  diphenhydrAMINE (BENADRYL) 25 MG tablet Take 25 mg by mouth at bedtime.     Historical Provider, MD  glipiZIDE (GLUCOTROL XL) 5 MG 24 hr tablet Take 5 mg by mouth daily as needed.  09/19/16   Historical Provider, MD  levofloxacin (LEVAQUIN) 500 MG tablet Take 500 mg by mouth daily.    Historical Provider, MD  Magnesium 500 MG TABS Take 500 mg by mouth daily as needed (FOR CRAMPING).    Historical Provider, MD  Potassium Gluconate 595 MG CAPS Take 595 mg by mouth daily as needed (FOR CRAMPING).    Historical Provider, MD  Tamsulosin HCl (FLOMAX) 0.4 MG CAPS Take 0.4 mg by mouth daily after supper.     Historical Provider, MD    Family History Family History  Problem Relation Age of Onset  . Brain cancer Mother   . Heart attack Father   . Brain  cancer Brother   . Cancer - Lung Brother   . Stroke Brother     Social History Social History  Substance Use Topics  . Smoking status: Former Smoker    Packs/day: 2.00    Years: 50.00    Types: Cigarettes    Quit date: 11/30/2009  . Smokeless tobacco: Never Used  . Alcohol use No     Allergies   Adhesive [tape]   Review of Systems Review of Systems  Constitutional: Positive for fever.  Respiratory: Negative for shortness of breath.   Cardiovascular: Negative for chest pain.  Gastrointestinal: Negative for blood in stool, diarrhea and vomiting.  Genitourinary: Negative for dysuria.  Neurological: Positive for weakness.  All other systems reviewed and are negative.    Physical Exam Updated Vital Signs BP 129/76   Pulse 71   Temp 98.3 F (36.8 C) (Oral)   Resp 15   Ht 6'  (1.829 m)   Wt 87.5 kg   SpO2 97%   BMI 26.18 kg/m   Physical Exam  Constitutional: He appears well-developed and well-nourished. No distress.  HENT:  Head: Normocephalic and atraumatic.  Right Ear: External ear normal.  Left Ear: External ear normal.  Eyes: Conjunctivae are normal. Right eye exhibits no discharge. Left eye exhibits no discharge. No scleral icterus.  Neck: Neck supple. No tracheal deviation present.  Cardiovascular: Normal rate, regular rhythm and intact distal pulses.   Pulmonary/Chest: Effort normal and breath sounds normal. No stridor. No respiratory distress. He has no wheezes. He has no rales.  Abdominal: Soft. Bowel sounds are normal. He exhibits no distension. There is no tenderness. There is no rebound and no guarding.  Musculoskeletal: He exhibits no edema or tenderness.  Neurological: He is alert. He has normal strength. No cranial nerve deficit (no facial droop, extraocular movements intact, no slurred speech) or sensory deficit. He exhibits normal muscle tone. He displays no seizure activity. Coordination normal.  Skin: Skin is warm and dry. No rash noted. There is pallor.  Psychiatric: He has a normal mood and affect.  Nursing note and vitals reviewed.    ED Treatments / Results  Labs (all labs ordered are listed, but only abnormal results are displayed) Labs Reviewed  CBC - Abnormal; Notable for the following:       Result Value   WBC 12.7 (*)    RBC 2.66 (*)    Hemoglobin 8.3 (*)    HCT 24.7 (*)    RDW 21.2 (*)    All other components within normal limits  BASIC METABOLIC PANEL - Abnormal; Notable for the following:    Chloride 112 (*)    Glucose, Bld 166 (*)    Calcium 8.8 (*)    All other components within normal limits  DIC (DISSEMINATED INTRAVASCULAR COAGULATION) PANEL  HAPTOGLOBIN  LACTATE DEHYDROGENASE  POC OCCULT BLOOD, ED  TYPE AND SCREEN    Procedures Procedures (including critical care time)  Medications Ordered in  ED Medications  predniSONE (DELTASONE) tablet 80 mg (not administered)     Initial Impression / Assessment and Plan / ED Course  I have reviewed the triage vital signs and the nursing notes.  Pertinent labs & imaging results that were available during my care of the patient were reviewed by me and considered in my medical decision making (see chart for details).  Clinical Course  Comment By Time  Hgb was 12.5 1 month ago.  Then 10.8 just 7 days ago.  Now down to 8.3  Significant drop over the last month.  Pt saw his PCP today and was told to come to the hospital.  I don't think he needs to have a transfusion now but he may if he continues to drop.  Will send off additional labs to check for hemolysis.  Add on POC stool.  Start on oral steroids and admit for further evaluation. Dorie Rank, MD 10/27 1549     Final Clinical Impressions(s) / ED Diagnoses   Final diagnoses:  Symptomatic anemia      Dorie Rank, MD 09/27/16 [phone]

## 2016-09-27 NOTE — Progress Notes (Addendum)
MD paged to clarify diet order for patient  6:55 PM Dr. Aggie Moats paged and orders placed

## 2016-09-28 DIAGNOSIS — J181 Lobar pneumonia, unspecified organism: Secondary | ICD-10-CM

## 2016-09-28 DIAGNOSIS — D589 Hereditary hemolytic anemia, unspecified: Secondary | ICD-10-CM | POA: Diagnosis present

## 2016-09-28 DIAGNOSIS — D591 Other autoimmune hemolytic anemias: Secondary | ICD-10-CM | POA: Diagnosis not present

## 2016-09-28 DIAGNOSIS — N400 Enlarged prostate without lower urinary tract symptoms: Secondary | ICD-10-CM | POA: Diagnosis not present

## 2016-09-28 DIAGNOSIS — D588 Other specified hereditary hemolytic anemias: Secondary | ICD-10-CM

## 2016-09-28 DIAGNOSIS — Z91048 Other nonmedicinal substance allergy status: Secondary | ICD-10-CM | POA: Diagnosis not present

## 2016-09-28 DIAGNOSIS — Y9289 Other specified places as the place of occurrence of the external cause: Secondary | ICD-10-CM | POA: Diagnosis not present

## 2016-09-28 DIAGNOSIS — E162 Hypoglycemia, unspecified: Secondary | ICD-10-CM | POA: Diagnosis not present

## 2016-09-28 DIAGNOSIS — R739 Hyperglycemia, unspecified: Secondary | ICD-10-CM | POA: Diagnosis present

## 2016-09-28 DIAGNOSIS — Z7952 Long term (current) use of systemic steroids: Secondary | ICD-10-CM | POA: Diagnosis not present

## 2016-09-28 DIAGNOSIS — I10 Essential (primary) hypertension: Secondary | ICD-10-CM

## 2016-09-28 DIAGNOSIS — D649 Anemia, unspecified: Secondary | ICD-10-CM | POA: Diagnosis not present

## 2016-09-28 DIAGNOSIS — R0602 Shortness of breath: Secondary | ICD-10-CM | POA: Diagnosis present

## 2016-09-28 DIAGNOSIS — J189 Pneumonia, unspecified organism: Secondary | ICD-10-CM | POA: Diagnosis present

## 2016-09-28 DIAGNOSIS — Z7982 Long term (current) use of aspirin: Secondary | ICD-10-CM | POA: Diagnosis not present

## 2016-09-28 DIAGNOSIS — I700 Atherosclerosis of aorta: Secondary | ICD-10-CM | POA: Diagnosis present

## 2016-09-28 DIAGNOSIS — T380X5A Adverse effect of glucocorticoids and synthetic analogues, initial encounter: Secondary | ICD-10-CM | POA: Diagnosis present

## 2016-09-28 DIAGNOSIS — Z87891 Personal history of nicotine dependence: Secondary | ICD-10-CM | POA: Diagnosis not present

## 2016-09-28 DIAGNOSIS — M6281 Muscle weakness (generalized): Secondary | ICD-10-CM | POA: Diagnosis present

## 2016-09-28 DIAGNOSIS — Z79899 Other long term (current) drug therapy: Secondary | ICD-10-CM | POA: Diagnosis not present

## 2016-09-28 LAB — CBC
HCT: 27.5 % — ABNORMAL LOW (ref 39.0–52.0)
Hemoglobin: 9.8 g/dL — ABNORMAL LOW (ref 13.0–17.0)
MCH: 32.2 pg (ref 26.0–34.0)
MCHC: 35.6 g/dL (ref 30.0–36.0)
MCV: 90.5 fL (ref 78.0–100.0)
PLATELETS: 243 10*3/uL (ref 150–400)
RBC: 3.04 MIL/uL — AB (ref 4.22–5.81)
RDW: 24 % — AB (ref 11.5–15.5)
WBC: 11.4 10*3/uL — AB (ref 4.0–10.5)

## 2016-09-28 LAB — BASIC METABOLIC PANEL
ANION GAP: 7 (ref 5–15)
BUN: 18 mg/dL (ref 6–20)
CHLORIDE: 108 mmol/L (ref 101–111)
CO2: 22 mmol/L (ref 22–32)
CREATININE: 0.89 mg/dL (ref 0.61–1.24)
Calcium: 8.5 mg/dL — ABNORMAL LOW (ref 8.9–10.3)
GFR calc Af Amer: >60 mL/min (ref >60)
Glucose, Bld: 287 mg/dL — ABNORMAL HIGH (ref 65–99)
Potassium: 4.1 mmol/L (ref 3.5–5.1)
Sodium: 137 mmol/L (ref 135–145)

## 2016-09-28 LAB — GLUCOSE, CAPILLARY
GLUCOSE-CAPILLARY: 261 mg/dL — AB (ref 65–99)
Glucose-Capillary: 202 mg/dL — ABNORMAL HIGH (ref 65–99)
Glucose-Capillary: 264 mg/dL — ABNORMAL HIGH (ref 65–99)

## 2016-09-28 LAB — HEMOGLOBIN AND HEMATOCRIT, BLOOD
HCT: 21.8 % — ABNORMAL LOW (ref 39.0–52.0)
HEMATOCRIT: 22.5 % — AB (ref 39.0–52.0)
HEMOGLOBIN: 7.5 g/dL — AB (ref 13.0–17.0)
Hemoglobin: 7.5 g/dL — ABNORMAL LOW (ref 13.0–17.0)

## 2016-09-28 LAB — HAPTOGLOBIN: Haptoglobin: <10 mg/dL — ABNORMAL LOW (ref 34–200)

## 2016-09-28 MED ORDER — INSULIN ASPART 100 UNIT/ML ~~LOC~~ SOLN
0.0000 [IU] | Freq: Three times a day (TID) | SUBCUTANEOUS | Status: DC
  Administered 2016-09-28: 3 [IU] via SUBCUTANEOUS
  Administered 2016-09-28: 5 [IU] via SUBCUTANEOUS
  Administered 2016-09-29: 1 [IU] via SUBCUTANEOUS
  Administered 2016-09-29: 5 [IU] via SUBCUTANEOUS
  Administered 2016-09-30: 7 [IU] via SUBCUTANEOUS
  Administered 2016-09-30: 1 [IU] via SUBCUTANEOUS
  Administered 2016-10-01: 2 [IU] via SUBCUTANEOUS
  Administered 2016-10-01: 5 [IU] via SUBCUTANEOUS

## 2016-09-28 MED ORDER — DIPHENHYDRAMINE HCL 25 MG PO CAPS: 25.0000 mg | ORAL_CAPSULE | Freq: Four times a day (QID) | ORAL | Status: DC | PRN

## 2016-09-28 MED ORDER — LEVOFLOXACIN IN D5W 500 MG/100ML IV SOLN
500.0000 mg | INTRAVENOUS | Status: DC
  Administered 2016-09-28 – 2016-10-01 (×4): 500 mg via INTRAVENOUS
  Filled 2016-09-28 (×4): qty 100

## 2016-09-28 NOTE — Progress Notes (Signed)
PROGRESS NOTE    Richard Evans  URK:[id number] DOB: [birth year] DOA: 09/27/2016 PCP: [name], MD   Brief Narrative:  Richard Evans is a 70 y.o. male with past medical history of hypertension and hemolytic anemia presents to emergency room with chief complaint of fatigue. Patient recently diagnosed with hemolytic anemia a few months ago. Was on high-dose steroids and recently tapered off. Recently completely off high-dose oral prednisone as of 2 weeks ago. Of note patient also recently diagnosed with pneumonia. Patient is followed by hematology oncology. They went to start patient on Rituxan but he was resistant due to his pneumonia. Given patient's gradual physical decline his family brought him to the emergency room for evaluation and he was admitted for His Autoimmune Hemolytic Anemia.   Assessment & Plan:   Principal Problem:   Hemolytic anemia (HCC) Active Problems:   Essential hypertension   BPH (benign prostatic hyperplasia)  Autoimmune Hemolytic Anemia -Discussed Case with Dr. Julien Nordmann who advised to continue Prednisone 1 mg/kg daily and add Dr. Alen Blew on the Treatment team so he can see patient Monday. He will then transfer care to Dr. Barbaraann Boys for second opinion by family Request. -Recommended Transfusing if Hb was less than 7.0 -Iron Panel Showed Iron of 88, UIBC of 181, TIBC of 269, Sat ratios of 33, Ferritin of 997, Folate of 20.7 -LDH was 508, Haptoglobin was <10, and Reticulocyte Count was 20.9; Di-Dimer was 1.75, and Fibrinogen was 604 -Patient given 80 mg of Prednisone in Ed; C/w Prednisone 80 mg Tablet Daily -Monitor H/H to see if pt needs transfusion; Last H/H was 7.5/21.8 (dropped from 9.8/27.5) -Type and screen ordered  -Insert 2nd peripheral large bore IV  -Blood smear sent -CT Abdomen and Pelvis showed Soft tissue mass in the central mesenteries of the small bowel adjacent to the fourth portion the duodenum decreased in size from 06/30/2016. Patient has received  steroid therapy for hemolytic anemia in the interval. Differential considerations would include extramedullary hematopoiesis of the mesenteries versus mesenteric panniculitis and less likely lymphoproliferative process.  Mild splenomegaly unchanged. Splenomegaly may also relate to extramedullary hematopoiesis. -Continue to Monitor Closely -Repeat CBC in AM   HTN -C/w Atenolol '25mg'$  qhs.    BPH -C/w Flomax 0.'4mg'$  qd  Hyperglycemia in the setting of Prednisone Use -BS have been uncontrolled -Added Sensitive Sliding Scale Insulin with CBG Monitoring as patient is on Prednisone -Continue To Monitor  Elevated WBC likely Community Acquired PNA -Pt has Sob and recent Pneumonia after long term steroids -C/w Levaquin IV for Pneumonia patient was diagnosed with (incomplete treated) -Chest Xray showed Ill-defined right upper lobe infiltrate. No new infiltrate identified. Atherosclerosis of the aorta. -Albuterol prn  DVT prophylaxis: SCD's Code Status: Full Family Communication: Discussed Case with Family at Bedside Disposition Plan: Home when stable  Consultants:  -Phone Consultation with Dr. Julien Nordmann in Hematology/Oncology.  Procedures: None  Antimicrobials:  Levofloxacin IV  Subjective: Seen and examined at bedside and was a little fatigued. Admitted to some SOB as well but no CP/Lightheadedness or Dizziness. Had no other complaints or concerns and would like to see a different Hematologist/Oncologist for Rituxan.   Objective: Vitals:   09/27/16 1834 09/27/16 2123 09/27/16 2356 09/28/16 0507  BP: (!) 146/66 (!) 168/71 131/70 136/71  Pulse: 78 93 75 76  Resp: '18 18  18  '$ Temp: 97.9 F (36.6 C) 98.3 F (36.8 C) 98.1 F (36.7 C) 98.1 F (36.7 C)  TempSrc: Oral Oral Oral Oral  SpO2: 95% 93%  94% 90%  Weight:      Height:        Intake/Output Summary (Last 24 hours) at 09/28/16 0940 Last data filed at 09/28/16 0700  Gross per 24 hour  Intake             1045 ml  Output                 0 ml  Net             1045 ml   Filed Weights   09/27/16 1339  Weight: 87.5 kg (193 lb)    Examination: Physical Exam:  Constitutional: WN/WD, NAD and appears calm and comfortable Eyes: Lids normal and conjunctiva a little pale, sclerae anicteric  ENMT: External Ears, Nose appear normal. Grossly normal hearing.  Neck: Appears normal, supple, no cervical masses, normal ROM, no appreciable thyromegaly Respiratory: Diminished breath sounds but no wheezing, rales, rhonchi or crackles. Normal respiratory effort and patient is not tachypenic. No accessory muscle use.  Cardiovascular: RRR, no murmurs / rubs / gallops. S1 and S2 auscultated. No extremity edema.  Abdomen: Soft, non-tender, non-distended. No masses palpated. No appreciable hepatosplenomegaly. Bowel sounds positive x4.  GU: Deferred. Musculoskeletal: No clubbing / cyanosis of digits/nails. No joint deformity upper and lower extremities. No contractures. Normal strength and muscle tone.  Skin: No rashes, lesions, ulcers. No induration; Warm and dry.  Neurologic: CN 2-12 grossly intact with no focal deficits. Sensation intact in all 4 Extremities. Strength 5/5 in all 4. Romberg sign cerebellar reflexes not assessed.  Psychiatric: Normal judgment and insight. Alert and oriented x 3. Normal mood and appropriate affect.   Data Reviewed: I have personally reviewed following labs and imaging studies  CBC:  Recent Labs Lab 09/27/16 1342 09/27/16 1543 09/27/16 1750 09/27/16 2029 09/28/16 0106 09/28/16 0529  WBC 12.7*  --   --   --  11.4*  --   HGB 8.3*  --  8.3* 7.2* 9.8* 7.5*  HCT 24.7*  --  24.9* 21.7* 27.5* 21.8*  MCV 92.9  --   --   --  90.5  --   PLT 275 292  --   --  243  --    Basic Metabolic Panel:  Recent Labs Lab 09/27/16 1342 09/28/16 0106  NA 141 137  K 4.1 4.1  CL 112* 108  CO2 22 22  GLUCOSE 166* 287*  BUN 18 18  CREATININE 0.89 0.89  CALCIUM 8.8* 8.5*   GFR: Estimated Creatinine  Clearance: 83.6 mL/min (by C-G formula based on SCr of 0.89 mg/dL). Liver Function Tests: No results for input(s): AST, ALT, ALKPHOS, BILITOT, PROT, ALBUMIN in the last 168 hours. No results for input(s): LIPASE, AMYLASE in the last 168 hours. No results for input(s): AMMONIA in the last 168 hours. Coagulation Profile:  Recent Labs Lab 09/27/16 1543  INR 1.19   Cardiac Enzymes: No results for input(s): CKTOTAL, CKMB, CKMBINDEX, TROPONINI in the last 168 hours. BNP (last 3 results) No results for input(s): PROBNP in the last 8760 hours. HbA1C: No results for input(s): HGBA1C in the last 72 hours. CBG: No results for input(s): GLUCAP in the last 168 hours. Lipid Profile: No results for input(s): CHOL, HDL, LDLCALC, TRIG, CHOLHDL, LDLDIRECT in the last 72 hours. Thyroid Function Tests: No results for input(s): TSH, T4TOTAL, FREET4, T3FREE, THYROIDAB in the last 72 hours. Anemia Panel:  Recent Labs  09/27/16 1750  VITAMINB12 296  FOLATE 20.7  FERRITIN 997*  TIBC 269  IRON 88  RETICCTPCT 20.9*   Sepsis Labs: No results for input(s): PROCALCITON, LATICACIDVEN in the last 168 hours.  No results found for this or any previous visit (from the past 240 hour(s)).   Radiology Studies: Dg Chest 2 View  Result Date: 09/28/2016 CLINICAL DATA:  Shortness of breath EXAM: CHEST  2 VIEW COMPARISON:  CT 09/23/2016 FINDINGS: There is an ill-defined infiltrate in the right upper lobe. There is linear scarring or atelectasis at the left base. No effusion. Cardiomediastinal silhouette is nonenlarged. Atherosclerosis of the aorta. No pneumothorax. IMPRESSION: Ill-defined right upper lobe infiltrate. No new infiltrate identified. Atherosclerosis of the aorta. Electronically Signed   By: Donavan Foil M.D.   On: 09/28/2016 04:35   Scheduled Meds: . atenolol  25 mg Oral QHS  . diphenhydrAMINE  25 mg Oral QHS  . insulin aspart  0-9 Units Subcutaneous TID WC  . levofloxacin (LEVAQUIN) IV  500 mg  Intravenous Q24H  . predniSONE  80 mg Oral Q breakfast  . tamsulosin  0.4 mg Oral QHS   Continuous Infusions: . sodium chloride 75 mL/hr at 09/28/16 0755     LOS: 0 days    Kerney Elbe, DO [facility] Pager [phone]  If 7PM-7AM, please contact night-coverage [email or web address] Password [email or web address] 09/28/2016, 9:40 AM

## 2016-09-29 LAB — CBC WITH DIFFERENTIAL/PLATELET
BASOS ABS: 0 10*3/uL (ref 0.0–0.1)
BASOS PCT: 0 %
EOS ABS: 0 10*3/uL (ref 0.0–0.7)
Eosinophils Relative: 0 %
HCT: 22.7 % — ABNORMAL LOW (ref 39.0–52.0)
HEMOGLOBIN: 7.3 g/dL — AB (ref 13.0–17.0)
LYMPHS PCT: 36 %
Lymphs Abs: 6 10*3/uL — ABNORMAL HIGH (ref 0.7–4.0)
MCH: 31.7 pg (ref 26.0–34.0)
MCV: 98.7 fL (ref 78.0–100.0)
MONOS PCT: 11 %
Monocytes Absolute: 1.8 10*3/uL — ABNORMAL HIGH (ref 0.1–1.0)
NEUTROS PCT: 53 %
Neutro Abs: 8.8 10*3/uL — ABNORMAL HIGH (ref 1.7–7.7)
PLATELETS: 233 10*3/uL (ref 150–400)
RBC: 2.3 MIL/uL — ABNORMAL LOW (ref 4.22–5.81)
RDW: 25.3 % — ABNORMAL HIGH (ref 11.5–15.5)
WBC: 16.6 10*3/uL — ABNORMAL HIGH (ref 4.0–10.5)

## 2016-09-29 LAB — COMPREHENSIVE METABOLIC PANEL
ALBUMIN: 3.3 g/dL — AB (ref 3.5–5.0)
ALK PHOS: 59 U/L (ref 38–126)
ALT: 18 U/L (ref 17–63)
AST: 25 U/L (ref 15–41)
Anion gap: 5 (ref 5–15)
BILIRUBIN TOTAL: 2.3 mg/dL — AB (ref 0.3–1.2)
BUN: 19 mg/dL (ref 6–20)
CO2: 22 mmol/L (ref 22–32)
CREATININE: 0.83 mg/dL (ref 0.61–1.24)
Calcium: 8.7 mg/dL — ABNORMAL LOW (ref 8.9–10.3)
Chloride: 115 mmol/L — ABNORMAL HIGH (ref 101–111)
GFR calc Af Amer: >60 mL/min (ref >60)
GLUCOSE: 125 mg/dL — AB (ref 65–99)
POTASSIUM: 3.5 mmol/L (ref 3.5–5.1)
Sodium: 142 mmol/L (ref 135–145)
TOTAL PROTEIN: 6.1 g/dL — AB (ref 6.5–8.1)

## 2016-09-29 LAB — GLUCOSE, CAPILLARY
GLUCOSE-CAPILLARY: 148 mg/dL — AB (ref 65–99)
GLUCOSE-CAPILLARY: 293 mg/dL — AB (ref 65–99)
GLUCOSE-CAPILLARY: 290 mg/dL — AB (ref 65–99)
Glucose-Capillary: 87 mg/dL (ref 65–99)

## 2016-09-29 LAB — HEMOGLOBIN AND HEMATOCRIT, BLOOD
HCT: 26.7 % — ABNORMAL LOW (ref 39.0–52.0)
Hemoglobin: 8.8 g/dL — ABNORMAL LOW (ref 13.0–17.0)

## 2016-09-29 LAB — MAGNESIUM: MAGNESIUM: 2.2 mg/dL (ref 1.7–2.4)

## 2016-09-29 LAB — PHOSPHORUS
PHOSPHORUS: 3 mg/dL (ref 2.5–4.6)
Phosphorus: 2.9 mg/dL (ref 2.5–4.6)

## 2016-09-29 MED ORDER — PREDNISONE 50 MG PO TABS
90.0000 mg | ORAL_TABLET | Freq: Every day | ORAL | Status: DC
  Administered 2016-09-29 – 2016-10-02 (×4): 90 mg via ORAL
  Filled 2016-09-29 (×4): qty 2

## 2016-09-29 MED ORDER — PREDNISONE 50 MG PO TABS: 90.0000 mg | ORAL_TABLET | Freq: Every day | ORAL | Status: DC

## 2016-09-29 MED ORDER — FOLIC ACID 1 MG PO TABS
1.0000 mg | ORAL_TABLET | Freq: Every day | ORAL | Status: DC
  Administered 2016-09-29 – 2016-10-02 (×4): 1 mg via ORAL
  Filled 2016-09-29 (×5): qty 1

## 2016-09-29 NOTE — Progress Notes (Signed)
PROGRESS NOTE    Richard Evans  LPF:[id number] DOB: 23-Mar-1946 DOA: 09/27/2016 PCP: [name], MD   Brief Narrative:  Richard Evans is a 70 y.o. male with past medical history of hypertension and hemolytic anemia presents to emergency room with chief complaint of fatigue. Patient recently diagnosed with hemolytic anemia a few months ago. Was on high-dose steroids and recently tapered off. Recently completely off high-dose oral prednisone as of 2 weeks ago. Of note patient also recently diagnosed with pneumonia. Patient is followed by hematology oncology. They went to start patient on Rituxan but he was resistant due to his pneumonia. Given patient's gradual physical decline his family brought him to the emergency room for evaluation and he was admitted for His Autoimmune Hemolytic Anemia.   Assessment & Plan:   Principal Problem:   Hemolytic anemia (HCC) Active Problems:   Essential hypertension   BPH (benign prostatic hyperplasia)  Autoimmune Hemolytic Anemia -Discussed Case with Dr. Julien Nordmann who advised to continue Prednisone 1 mg/kg daily and add Dr. Alen Blew on the Treatment team so he can see patient Monday. He will then transfer care to Dr. Barbaraann Boys for second opinion by family Request. -Recommended Transfusing if Hb was less than 7.0 -Iron Panel Showed Iron of 88, UIBC of 181, TIBC of 269, Sat ratios of 33, Ferritin of 997, Folate of 20.7 -LDH was 508, Haptoglobin was <10, and Reticulocyte Count was 20.9; Di-Dimer was 1.75, and Fibrinogen was 604 -Patient given 80 mg of Prednisone in Ed;  -Increased Prednisone 80 mg Tablet Daily to 90 mg daly as patient's Weight was 87.5 Kilograms.  -Monitor H/H to see if pt needs transfusion; Last H/H was 7.3/22.7 -Repeat H/H at 1600 -Type and screen ordered  -Insert 2nd peripheral large bore IV  -Blood smear sent and showed Atypical Lymphocytes with Mild Left Shift -CT Abdomen and Pelvis showed Soft tissue mass in the central mesenteries of  the small bowel adjacent to the fourth portion the duodenum decreased in size from 06/30/2016. Patient has received steroid therapy for hemolytic anemia in the interval. Differential considerations would include extramedullary hematopoiesis of the mesenteries versus mesenteric panniculitis and less likely lymphoproliferative process.  Mild splenomegaly unchanged. Splenomegaly may also relate to extramedullary hematopoiesis. -Continue to Monitor Closely -Will Start Folic Acid 1 mg daily -Repeat CBC in AM -PT to Eval and Treat   HTN -C/w Atenolol '25mg'$  qhs.    BPH -C/w Flomax 0.'4mg'$  qd  Hyperglycemia in the setting of Prednisone Use -CBG ranging from 125-287 -BS have been uncontrolled -Added Sensitive Sliding Scale Insulin with CBG Monitoring as patient is on Prednisone -Continue To Monitor  Leukocytosis likely Demarginalization for Steroid Use in the setting of Community Acquired PNA  -D/C'd NS 0.9% at 75 mL/hr -Pt has Sob and recent Pneumonia after long term steroids -C/w Levaquin IV for Pneumonia patient was diagnosed with (incomplete treated); Will likley transition back to po Levaquin when discussed with Pulmonology as patient wishes to see Dr. Elsworth Soho in [place] showed Ill-defined right upper lobe infiltrate. No new infiltrate identified. Atherosclerosis of the aorta. -Albuterol prn -Will attempt to Notify Dr. Elsworth Soho that Patient is in the Hospital. -Repeat CBC in AM  DVT prophylaxis: SCD's Code Status: Full Family Communication: Discussed Case with Wife at Bedside Disposition Plan: Home when stable  Consultants:  -Phone Consultation with Dr. Julien Nordmann in Hematology/Oncology. Dr. Alen Blew to see patient in AM  Procedures: None  Antimicrobials:  Levofloxacin IV  Subjective: Seen and examined at  bedside and stated that his fatigue was improved but he had an appetite because of the Prednisone. Denied any SOB/N/V or lightheadedness or dizziness. Family wishes for Dr.  Elsworth Soho to come by to speak with the patient. No other concerns or complaints   Objective: Vitals:   09/28/16 1320 09/28/16 2038 09/29/16 0533 09/29/16 1433  BP: (!) 120/59 132/71 125/62 (!) 153/77  Pulse: 81 79 68 79  Resp: '18 18 18 18  '$ Temp: 98.9 F (37.2 C) 98 F (36.7 C) 98.2 F (36.8 C) 98.2 F (36.8 C)  TempSrc: Oral Oral Oral Oral  SpO2: 97% 97% 94% 94%  Weight:      Height:        Intake/Output Summary (Last 24 hours) at 09/29/16 1454 Last data filed at 09/29/16 0700  Gross per 24 hour  Intake             1800 ml  Output                0 ml  Net             1800 ml   Filed Weights   09/27/16 1339  Weight: 87.5 kg (193 lb)    Examination: Physical Exam:  Constitutional: WN/WD, NAD and appears calm and comfortable Eyes: Lids normal and conjunctiva a little pale, sclerae anicteric  ENMT: External Ears, Nose appear normal. Grossly normal hearing.  Neck: Appears normal, supple, no cervical masses, normal ROM, no appreciable thyromegaly Respiratory: Diminished breath sounds in RUL but no wheezing, rales, rhonchi or crackles. Normal respiratory effort and patient is not tachypenic. No accessory muscle use.  Cardiovascular: RRR, no murmurs / rubs / gallops. S1 and S2 auscultated. No extremity edema.  Abdomen: Soft, non-tender, non-distended. No masses palpated. No appreciable hepatosplenomegaly. Bowel sounds positive x4.  GU: Deferred. Musculoskeletal: No clubbing / cyanosis of digits/nails. No joint deformity upper and lower extremities. No contractures. Normal strength and muscle tone.  Skin: No rashes, lesions, ulcers. No induration; Warm and dry.  Neurologic: CN 2-12 grossly intact with no focal deficits. Sensation intact in all 4 Extremities. Strength 5/5 in all 4. Romberg sign cerebellar reflexes not assessed.  Psychiatric: Normal judgment and insight. Alert and oriented x 3. Normal mood and appropriate affect.   Data Reviewed: I have personally reviewed following  labs and imaging studies  CBC:  Recent Labs Lab 09/27/16 1342 09/27/16 1543  09/27/16 2029 09/28/16 0106 09/28/16 0529 09/28/16 1430 09/29/16 0533  WBC 12.7*  --   --   --  11.4*  --   --  16.6*  NEUTROABS  --   --   --   --   --   --   --  8.8*  HGB 8.3*  --   < > 7.2* 9.8* 7.5* 7.5* 7.3*  HCT 24.7*  --   < > 21.7* 27.5* 21.8* 22.5* 22.7*  MCV 92.9  --   --   --  90.5  --   --  98.7  PLT 275 292  --   --  243  --   --  233  < > = values in this interval not displayed. Basic Metabolic Panel:  Recent Labs Lab 09/27/16 1342 09/28/16 0106 09/29/16 0533  NA 141 137 142  K 4.1 4.1 3.5  CL 112* 108 115*  CO2 '22 22 22  '$ GLUCOSE 166* 287* 125*  BUN '18 18 19  '$ CREATININE 0.89 0.89 0.83  CALCIUM 8.8* 8.5* 8.7*  MG  --   --  2.2  PHOS  --   --  2.9   GFR: Estimated Creatinine Clearance: 89.6 mL/min (by C-G formula based on SCr of 0.83 mg/dL). Liver Function Tests:  Recent Labs Lab 09/29/16 0533  AST 25  ALT 18  ALKPHOS 59  BILITOT 2.3*  PROT 6.1*  ALBUMIN 3.3*   No results for input(s): LIPASE, AMYLASE in the last 168 hours. No results for input(s): AMMONIA in the last 168 hours. Coagulation Profile:  Recent Labs Lab 09/27/16 1543  INR 1.19   Cardiac Enzymes: No results for input(s): CKTOTAL, CKMB, CKMBINDEX, TROPONINI in the last 168 hours. BNP (last 3 results) No results for input(s): PROBNP in the last 8760 hours. HbA1C: No results for input(s): HGBA1C in the last 72 hours. CBG:  Recent Labs Lab 09/28/16 1154 09/28/16 1645 09/28/16 2036 09/29/16 0814 09/29/16 1244  GLUCAP 202* 261* 264* 87 148*   Lipid Profile: No results for input(s): CHOL, HDL, LDLCALC, TRIG, CHOLHDL, LDLDIRECT in the last 72 hours. Thyroid Function Tests: No results for input(s): TSH, T4TOTAL, FREET4, T3FREE, THYROIDAB in the last 72 hours. Anemia Panel:  Recent Labs  09/27/16 1750  VITAMINB12 296  FOLATE 20.7  FERRITIN 997*  TIBC 269  IRON 88  RETICCTPCT 20.9*    Sepsis Labs: No results for input(s): PROCALCITON, LATICACIDVEN in the last 168 hours.  No results found for this or any previous visit (from the past 240 hour(s)).   Radiology Studies: Dg Chest 2 View  Result Date: 09/28/2016 CLINICAL DATA:  Shortness of breath EXAM: CHEST  2 VIEW COMPARISON:  CT 09/23/2016 FINDINGS: There is an ill-defined infiltrate in the right upper lobe. There is linear scarring or atelectasis at the left base. No effusion. Cardiomediastinal silhouette is nonenlarged. Atherosclerosis of the aorta. No pneumothorax. IMPRESSION: Ill-defined right upper lobe infiltrate. No new infiltrate identified. Atherosclerosis of the aorta. Electronically Signed   By: Donavan Foil M.D.   On: 09/28/2016 04:35   Scheduled Meds: . atenolol  25 mg Oral QHS  . diphenhydrAMINE  25 mg Oral QHS  . insulin aspart  0-9 Units Subcutaneous TID WC  . levofloxacin (LEVAQUIN) IV  500 mg Intravenous Q24H  . predniSONE  90 mg Oral Q breakfast  . tamsulosin  0.4 mg Oral QHS   Continuous Infusions: . sodium chloride 75 mL/hr at 09/29/16 0928     LOS: 1 day   Kerney Elbe, DO [facility] Pager [phone]  If 7PM-7AM, please contact night-coverage [email or web address] Password [email or web address] 09/29/2016, 2:54 PM

## 2016-09-30 ENCOUNTER — Other Ambulatory Visit: Payer: Medicare Other

## 2016-09-30 DIAGNOSIS — D591 Other autoimmune hemolytic anemias: Secondary | ICD-10-CM

## 2016-09-30 DIAGNOSIS — D649 Anemia, unspecified: Secondary | ICD-10-CM

## 2016-09-30 DIAGNOSIS — M6281 Muscle weakness (generalized): Secondary | ICD-10-CM

## 2016-09-30 DIAGNOSIS — D72829 Elevated white blood cell count, unspecified: Secondary | ICD-10-CM

## 2016-09-30 DIAGNOSIS — R0602 Shortness of breath: Secondary | ICD-10-CM

## 2016-09-30 LAB — COMPREHENSIVE METABOLIC PANEL
ALT: 20 U/L (ref 17–63)
AST: 26 U/L (ref 15–41)
Albumin: 3.3 g/dL — ABNORMAL LOW (ref 3.5–5.0)
Alkaline Phosphatase: 63 U/L (ref 38–126)
Anion gap: 6 (ref 5–15)
BILIRUBIN TOTAL: 2.3 mg/dL — AB (ref 0.3–1.2)
BUN: 22 mg/dL — AB (ref 6–20)
CALCIUM: 8.4 mg/dL — AB (ref 8.9–10.3)
CO2: 24 mmol/L (ref 22–32)
CREATININE: 0.97 mg/dL (ref 0.61–1.24)
Chloride: 110 mmol/L (ref 101–111)
GFR calc Af Amer: >60 mL/min (ref >60)
Glucose, Bld: 157 mg/dL — ABNORMAL HIGH (ref 65–99)
POTASSIUM: 3.7 mmol/L (ref 3.5–5.1)
Sodium: 140 mmol/L (ref 135–145)
TOTAL PROTEIN: 5.8 g/dL — AB (ref 6.5–8.1)

## 2016-09-30 LAB — CBC WITH DIFFERENTIAL/PLATELET
BASOS PCT: 1 %
Basophils Absolute: 0.2 10*3/uL — ABNORMAL HIGH (ref 0.0–0.1)
EOS PCT: 0 %
Eosinophils Absolute: 0 10*3/uL (ref 0.0–0.7)
HEMATOCRIT: 23.2 % — AB (ref 39.0–52.0)
Hemoglobin: 7.7 g/dL — ABNORMAL LOW (ref 13.0–17.0)
LYMPHS ABS: 6.9 10*3/uL — AB (ref 0.7–4.0)
Lymphocytes Relative: 39 %
MCH: 32.4 pg (ref 26.0–34.0)
MCHC: 33.2 g/dL (ref 30.0–36.0)
MCV: 97.5 fL (ref 78.0–100.0)
MONO ABS: 1.8 10*3/uL — AB (ref 0.1–1.0)
Monocytes Relative: 10 %
NEUTROS PCT: 50 %
Neutro Abs: 8.9 10*3/uL — ABNORMAL HIGH (ref 1.7–7.7)
Platelets: 239 10*3/uL (ref 150–400)
RBC: 2.38 MIL/uL — ABNORMAL LOW (ref 4.22–5.81)
RDW: 26 % — AB (ref 11.5–15.5)
WBC: 17.8 10*3/uL — ABNORMAL HIGH (ref 4.0–10.5)

## 2016-09-30 LAB — HEMOGLOBIN A1C: Mean Plasma Glucose: <74 mg/dL

## 2016-09-30 LAB — MAGNESIUM: MAGNESIUM: 2.3 mg/dL (ref 1.7–2.4)

## 2016-09-30 LAB — GLUCOSE, CAPILLARY
GLUCOSE-CAPILLARY: 327 mg/dL — AB (ref 65–99)
GLUCOSE-CAPILLARY: 260 mg/dL — AB (ref 65–99)
Glucose-Capillary: 89 mg/dL (ref 65–99)
Glucose-Capillary: 132 mg/dL — ABNORMAL HIGH (ref 65–99)

## 2016-09-30 NOTE — Progress Notes (Signed)
Inpatient Diabetes Program Recommendations  AACE/ADA: New Consensus Statement on Inpatient Glycemic Control (2015)  Target Ranges:  Prepandial:   less than 140 mg/dL      Peak postprandial:   less than 180 mg/dL (1-2 hours)      Critically ill patients:  140 - 180 mg/dL   Lab Results  Component Value Date   GLUCAP 89 09/30/2016   HGBA1C <4.2 (L) 09/28/2016    Review of Glycemic Control  Diabetes history: DM 2 Outpatient Diabetes medications: Glipizide 5 mg Daily Current orders for Inpatient glycemic control: Novolog Sensitive TID  Inpatient Diabetes Program Recommendations:   Patient on PO prednisone 90 mg Daily and has postprandial elevations in the 290's. Please consider Novolog 3 units TID meal coverage in addition of Novolog Correction scale if patient consumes at least 50% of meals.  Thanks,  Tama Headings RN, MSN, [facility] Inpatient Diabetes Coordinator Team Pager [phone] (8a-5p)

## 2016-09-30 NOTE — Evaluation (Signed)
Physical Therapy Evaluation & discharge Patient Details Name: Richard Evans MRN: [id number] DOB: 07/25/1946 Today's Date: 09/30/2016   History of Present Illness  HPI: Richard Evans is a 70 y.o. male with past medical history of hypertension and hemolytic anemia presents to emergency room with chief complaint of fatigue. Patient recently diagnosed with hemolytic anemia a few months ago. Was on high-dose steroids and recently tapered off. Recently completely off high-dose oral prednisone as of 2 weeks ago. Of note patient also recently diagnosed with pneumonia.   Clinical Impression  Pt moving well at MOD Independent level.  Discussed home safety.  Encouraged pt to ambulate in hallways to prevent strength loss in hospital.  No further PT needs identified and will sign off. Please re-order if pt status changes.    Follow Up Recommendations No PT follow up    Equipment Recommendations  None recommended by PT    Recommendations for Other Services       Precautions / Restrictions Precautions Precautions: None Restrictions Weight Bearing Restrictions: No      Mobility  Bed Mobility               General bed mobility comments: Pt up in chair upon arrival  Transfers Overall transfer level: Modified independent                  Ambulation/Gait Ambulation/Gait assistance: Modified independent (Device/Increase time)   Assistive device: None Gait Pattern/deviations: Step-through pattern   Gait velocity interpretation: at or above normal speed for age/gender General Gait Details: Pt able to ambulate in hallway with no LOB with head turns, head nods, quick stops.  Stairs            Wheelchair Mobility    Modified Rankin (Stroke Patients Only)       Balance Overall balance assessment: Independent                                           Pertinent Vitals/Pain Pain Assessment: No/denies pain    Home Living Family/patient expects to be  discharged to:: Private residence Living Arrangements: Spouse/significant other Available Help at Discharge: Available 24 hours/day;Family Type of Home: House Home Access: Level entry     Home Layout: One level Home Equipment: None      Prior Function Level of Independence: Independent               Hand Dominance   Dominant Hand: Right    Extremity/Trunk Assessment   Upper Extremity Assessment: Overall WFL for tasks assessed           Lower Extremity Assessment: Overall WFL for tasks assessed         Communication   Communication: No difficulties  Cognition Arousal/Alertness: Awake/alert Behavior During Therapy: WFL for tasks assessed/performed Overall Cognitive Status: Within Functional Limits for tasks assessed                      General Comments General comments (skin integrity, edema, etc.): Pt educated on listening to body when he feels more fatigued or dizzy    Exercises     Assessment/Plan    PT Assessment Patent does not need any further PT services  PT Problem List            PT Treatment Interventions      PT Goals (Current goals can be  found in the Care Plan section)  Acute Rehab PT Goals PT Goal Formulation: All assessment and education complete, DC therapy    Frequency     Barriers to discharge        Co-evaluation               End of Session   Activity Tolerance: Patient tolerated treatment well Patient left: in chair;with family/visitor present;with call bell/phone within reach Nurse Communication: Mobility status         Time: 5366-4403 PT Time Calculation (min) (ACUTE ONLY): 12 min   Charges:   PT Evaluation $PT Eval Low Complexity: 1 Procedure     PT G Codes:        Simara Rhyner LUBECK 09/30/2016, 12:06 PM

## 2016-09-30 NOTE — Progress Notes (Signed)
IP PROGRESS NOTE  Subjective:   Richard Evans is known to me with history of acute autoimmune hemolytic anemia. He was hospitalized on 09/27/2016 with a hemoglobin of 8.3 and community-acquired pneumonia.  He was started on prednisone at 90 mg daily and intravenous antibiotics. His hemoglobin continues to fluctuate between 7.3 and 8.8. His reticulocyte count is elevated and haptoglobin is less than 10. These findings indicate relapse of his acute autoimmune hemolysis.  Clinically he feels reasonably well and improved since he started antibiotics. He does not report any fevers or chills or sweats. He does not report any cough or dyspnea on exertion. He is mildly fatigued.  Objective:  Vital signs in last 24 hours: Temp:  [97.9 F (36.6 C)-98.6 F (37 C)] 97.9 F (36.6 C) (10/30 1033) Pulse Rate:  [65-79] 68 (10/30 1033) Resp:  [18-19] 19 (10/30 1033) BP: (134-153)/(59-77) 139/59 (10/30 1033) SpO2:  [94 %-99 %] 98 % (10/30 1033) Weight change:  Last BM Date: 09/29/16  Intake/Output from previous day: 10/29 0701 - 10/30 0700 In: 740 [P.O.:740] Out: -  Alert, awake gentleman appeared without distress. Mouth: mucous membranes moist, pharynx normal without lesions Resp: clear to auscultation bilaterally Cardio: regular rate and rhythm, S1, S2 normal, no murmur, click, rub or gallop GI: soft, non-tender; bowel sounds normal; no masses,  no organomegaly Extremities: extremities normal, atraumatic, no cyanosis or edema    Lab Results:  Recent Labs  09/29/16 0533 09/29/16 1520 09/30/16 0441  WBC 16.6*  --  17.8*  HGB 7.3* 8.8* 7.7*  HCT 22.7* 26.7* 23.2*  PLT 233  --  239    BMET  Recent Labs  09/29/16 0533 09/30/16 0441  NA 142 140  K 3.5 3.7  CL 115* 110  CO2 22 24  GLUCOSE 125* 157*  BUN 19 22*  CREATININE 0.83 0.97  CALCIUM 8.7* 8.4*    Medications: I have reviewed the patient's current medications.  Assessment/Plan:  70 year old gentleman with the  following issues:  1. Autoimmune hemolytic anemia responsive to prednisone although relapsed rather quickly after prednisone taper. I agree with restarting prednisone at the current dose and monitoring his counts daily.  If his hemoglobin just below 7, 1 unit of packed red cell transfusion would be reasonable to consider for better symptom management. It is also reasonable to hold off on transfusion if he is asymptomatic.  I have discussed the situation with him extensively today and I fear that he will relapsed quickly after prednisone taper. He developed hypoglycemia as well as community-acquired pneumonia on high-dose prednisone and will likely require additional therapy. I have suggested the rituximab versus splenectomy and he is hesitant to proceed with neither of those.  I will cancel his rituximab infusions for now and monitor his counts on prednisone alone.  2. Evaluation for a possible hematological disorder: His CT scan of the abdomen did show mild splenomegaly which could be related to autoimmune hemolysis but could also be due to a lymphoproliferative disorder or myeloproliferative disorder. Bone marrow biopsy would be required at some point to complete his workup to rule out a hematological condition as the cause of his autoimmune hemolysis.  3. Community-acquired pneumonia: I agree with the current management with intravenous antibiotics.  I continue to offer my services to Richard Evans and he is considering getting a second opinion from different physicians.  I certainly encouraged him to do so.   LOS: 2 days   Anthon Harpole 09/30/2016, 11:32 AM

## 2016-09-30 NOTE — Progress Notes (Signed)
PROGRESS NOTE    WOODFORD STREGE  UGQ:[id number] DOB: 1946/10/16 DOA: 09/27/2016 PCP: [name], MD   Brief Narrative:  Richard Evans is a 70 y.o. male with past medical history of hypertension and hemolytic anemia presents to emergency room with chief complaint of fatigue. Patient recently diagnosed with hemolytic anemia a few months ago. Was on high-dose steroids and recently tapered off. Recently completely off high-dose oral prednisone as of 2 weeks ago. Of note patient also recently diagnosed with pneumonia. Patient is followed by hematology oncology. They went to start patient on Rituxan but he was resistant due to his pneumonia. Given patient's gradual physical decline his family brought him to the emergency room for evaluation and he was admitted for His Autoimmune Hemolytic Anemia.   Assessment & Plan:   Principal Problem:   Hemolytic anemia (HCC) Active Problems:   Essential hypertension   BPH (benign prostatic hyperplasia)  Autoimmune Hemolytic Anemia -Discussed Case with Dr. Julien Nordmann on Saturday who advised to continue Prednisone 1 mg/kg daily and add Dr. Alen Blew on the Treatment team so he can see patient Monday. He will then transfer care to Dr. Barbaraann Boys for second opinion by family Request. -Recommended Transfusing if Hb was less than 7.0 -Iron Panel Showed Iron of 88, UIBC of 181, TIBC of 269, Sat ratios of 33, Ferritin of 997, Folate of 20.7 -LDH was 508, Haptoglobin was <10, and Reticulocyte Count was 20.9; Di-Dimer was 1.75, and Fibrinogen was 604 -Patient given 80 mg of Prednisone in Ed;  -C/w Prednisone 90 mg daly as patient's Weight was 87.5 Kilograms.  -Monitor H/H to see if pt needs transfusion; Today's Hb/Hct showed 7.7/23.2 (8.8/26.7) -Type and screen ordered  -Insert 2nd peripheral large bore IV  -Blood smear sent and showed Atypical Lymphocytes with Mild Left Shift -CT Abdomen and Pelvis showed Soft tissue mass in the central mesenteries of the small bowel  adjacent to the fourth portion the duodenum decreased in size from 06/30/2016. Patient has received steroid therapy for hemolytic anemia in the interval. Differential considerations would include extramedullary hematopoiesis of the mesenteries versus mesenteric panniculitis and less likely lymphoproliferative process.  Mild splenomegaly unchanged. Splenomegaly may also relate to extramedullary hematopoiesis. -Per Dr. Alen Blew patient to have Bone Marrow Biopsy at some point to complete workup to r/o Hematological Conditions -Will Start Folic Acid 1 mg daily -PT to Eval and Treat and recommended no follow up -Dr. Alen Blew evaluated the patient and appreciate additional Recc's. Dr. Alen Blew recommened Splenectomy s. Rituximab and patient is hesitant to proceed with either of those options. Dr. Alen Blew to cancel Rituximab Infusions.  -Continue to Monitor Closely and Repeat CBC in AM   HTN -C/w Atenolol 55m qhs.    BPH -C/w Flomax 0.421mqd  Hyperglycemia in the setting of Prednisone Use -CBG ranging from 89-293; 157 on BMP -C/w Sensitive Sliding Scale Insulin with CBG Monitoring as patient is on Prednisone -Continue To Monitor and if BS are increased will increase Sliding Scale  Leukocytosis likely Demarginalization for Steroid Use in the setting of Community Acquired PNA  -Patient's WBC was 17.8 today (increased from 16.6) -D/C'd NS 0.9% at 75 mL/hr -Pt has Sob and recent Pneumonia after long term steroids -C/w Levaquin IV for Pneumonia patient was diagnosed with (incomplete treated as an outpatient); Will likley transition back to po Levaquin when discussed with Pulmonology as patient [place]-defined right upper lobe infiltrate. No new infiltrate identified. Atherosclerosis of  the aorta. -Albuterol prn -Will attempt to Notify Dr. Elsworth Soho that Patient is in the Hospital. -Repeat CBC in AM  DVT prophylaxis: SCD's Code Status: Full Family  Communication: Discussed Case with Wife at Bedside Disposition Plan: Home when stable  Consultants:  -Phone Consultation with Dr. Julien Nordmann in Hematology/Oncology. Dr. Alen Blew patient's Primary Hematologist/Oncologist now Following  Procedures: None  Antimicrobials:  Levofloxacin IV  Subjective: Seen and examined at bedside and stated that his fatigue was  Much improved and stated he slept well. Had some mild swelling in his legs that he attributed to the Prednisone. Denied any SOB/N/V or lightheadedness or dizziness. Patient was concerned about a knot on his Right arm that occurred several months ago which they feel like contributed to his Hemolytic Anemia. When looking at the pictures and arm the place they are showing is much improved. No other complaints or concerns at this point in time.   Objective: Vitals:   09/29/16 1433 09/29/16 2117 09/30/16 0608 09/30/16 1033  BP: (!) 153/77 (!) 145/71 134/66 (!) 139/59  Pulse: 79 73 65 68  Resp: _0 Temp: 98.2 F (36.8 C) 98.1 F (36.7 C) 98.6 F (37 C) 97.9 F (36.6 C)  TempSrc: Oral Oral Oral Oral  SpO2: 94% 98% 99% 98%  Weight:      Height:        Intake/Output Summary (Last 24 hours) at 09/30/16 1333 Last data filed at 09/30/16 0930  Gross per 24 hour  Intake              620 ml  Output                0 ml  Net              620 ml   Filed Weights   09/27/16 1339  Weight: 87.5 kg (193 lb)    Examination: Physical Exam:  Constitutional: WN/WD, NAD and appears calm and comfortable Eyes: Lids normal and conjunctiva a little pale, sclerae anicteric  ENMT: External Ears, Nose appear normal. Grossly normal hearing.  Neck: Appears normal, supple, no cervical masses, normal ROM, no appreciable thyromegaly Respiratory: Diminished breath sounds but no wheezing, rales, rhonchi or crackles. Normal respiratory effort and patient is not tachypenic. No accessory muscle use.  Cardiovascular: RRR, no murmurs / rubs / gallops. S1  and S2 auscultated. No extremity edema.  Abdomen: Soft, non-tender, non-distended. No masses palpated. No appreciable hepatosplenomegaly. Bowel sounds positive x4.  GU: Deferred. Musculoskeletal: No clubbing / cyanosis of digits/nails. No joint deformity upper and lower extremities. No contractures. Normal strength and muscle tone. Right Arm knot noted in Antecubital Fossa; Possibly previously pheblitis.  Skin: No rashes, lesions, ulcers. No induration; Warm and dry.  Neurologic: CN 2-12 grossly intact with no focal deficits. Sensation intact in all 4 Extremities. Strength 5/5 in all 4. Romberg sign cerebellar reflexes not assessed.  Psychiatric: Normal judgment and insight. Alert and oriented x 3. Normal mood and appropriate affect.   Data Reviewed: I have personally reviewed following labs and imaging studies  CBC:  Recent Labs Lab 09/27/16 1342 09/27/16 1543  09/28/16 0106 09/28/16 0529 09/28/16 1430 09/29/16 0533 09/29/16 1520 09/30/16 0441  WBC 12.7*  --   --  11.4*  --   --  16.6*  --  17.8*  NEUTROABS  --   --   --   --   --   --  8.8*  --  8.9*  HGB 8.3*  --   < >  9.8* 7.5* 7.5* 7.3* 8.8* 7.7*  HCT 24.7*  --   < > 27.5* 21.8* 22.5* 22.7* 26.7* 23.2*  MCV 92.9  --   --  90.5  --   --  98.7  --  97.5  PLT 275 292  --  243  --   --  233  --  239  < > = values in this interval not displayed. Basic Metabolic Panel:  Recent Labs Lab 09/27/16 1342 09/28/16 0106 09/29/16 0533 09/29/16 1520 09/30/16 0441  NA 141 137 142  --  140  K 4.1 4.1 3.5  --  3.7  CL 112* 108 115*  --  110  CO2 _0 --  24  GLUCOSE 166* 287* 125*  --  157*  BUN _1 --  22*  CREATININE 0.89 0.89 0.83  --  0.97  CALCIUM 8.8* 8.5* 8.7*  --  8.4*  MG  --   --  2.2  --  2.3  PHOS  --   --  2.9 3.0  --    GFR: Estimated Creatinine Clearance: 76.7 mL/min (by C-G formula based on SCr of 0.97 mg/dL). Liver Function Tests:  Recent Labs Lab 09/29/16 0533 09/30/16 0441  AST 25 26  ALT 18  20  ALKPHOS 59 63  BILITOT 2.3* 2.3*  PROT 6.1* 5.8*  ALBUMIN 3.3* 3.3*   No results for input(s): LIPASE, AMYLASE in the last 168 hours. No results for input(s): AMMONIA in the last 168 hours. Coagulation Profile:  Recent Labs Lab 09/27/16 1543  INR 1.19   Cardiac Enzymes: No results for input(s): CKTOTAL, CKMB, CKMBINDEX, TROPONINI in the last 168 hours. BNP (last 3 results) No results for input(s): PROBNP in the last 8760 hours. HbA1C:  Recent Labs  09/28/16 0937  HGBA1C <4.2*   CBG:  Recent Labs Lab 09/29/16 1244 09/29/16 1632 09/29/16 2115 09/30/16 0754 09/30/16 1207  GLUCAP 148* 293* 290* 89 132*   Lipid Profile: No results for input(s): CHOL, HDL, LDLCALC, TRIG, CHOLHDL, LDLDIRECT in the last 72 hours. Thyroid Function Tests: No results for input(s): TSH, T4TOTAL, FREET4, T3FREE, THYROIDAB in the last 72 hours. Anemia Panel:  Recent Labs  09/27/16 1750  VITAMINB12 296  FOLATE 20.7  FERRITIN 997*  TIBC 269  IRON 88  RETICCTPCT 20.9*   Sepsis Labs: No results for input(s): PROCALCITON, LATICACIDVEN in the last 168 hours.  No results found for this or any previous visit (from the past 240 hour(s)).   Radiology Studies: No results found. Scheduled Meds: . atenolol  25 mg Oral QHS  . diphenhydrAMINE  25 mg Oral QHS  . folic acid  1 mg Oral Daily  . insulin aspart  0-9 Units Subcutaneous TID WC  . levofloxacin (LEVAQUIN) IV  500 mg Intravenous Q24H  . predniSONE  90 mg Oral Q breakfast  . tamsulosin  0.4 mg Oral QHS   Continuous Infusions:    LOS: 2 days   Kerney Elbe, DO [facility] Pager [phone]  If 7PM-7AM, please contact night-coverage [email or web address] Password [email or web address] 09/30/2016, 1:33 PM

## 2016-09-30 NOTE — Progress Notes (Signed)
Nutrition Brief Note  RD consulted for diet education.   Wt Readings from Last 15 Encounters:  09/27/16 193 lb (87.5 kg)  09/24/16 191 lb 9.6 oz (86.9 kg)  09/20/16 191 lb 6.4 oz (86.8 kg)  08/21/16 188 lb 6.4 oz (85.5 kg)  07/31/16 190 lb 1.6 oz (86.2 kg)  07/17/16 191 lb (86.6 kg)  07/11/16 191 lb 11.2 oz (87 kg)  07/09/16 186 lb (84.4 kg)  07/04/16 181 lb 8 oz (82.3 kg)  12/11/10 195 lb (88.5 kg)   Richard Evans a 70 y.o.malewith past medical history of hypertension and hemolytic anemia presents to emergency room with chief complaint of fatigue. Patient recently diagnosed with hemolytic anemia a few months ago. Was on high-dose steroids and recently tapered off. Recently completely off high-dose oral prednisone as of 2 weeks ago. Of note patient also recently diagnosed with pneumonia. Patient is followed by hematology oncology. They went to start patient on Rituxan but he was resistant due to his pneumonia. Given patient's gradual physical decline his family brought him to the emergency room for evaluation and he was admitted for His Autoimmune Hemolytic Anemia.   Attempted to meet with pt x 2, however, pt in with other providers at time of visit.   Pt on prednisone PTA. Home Dm regimen 5 mg glipizide daily. CBGS improving; 89-293. Last Hgb A1c <4.2 (on 09/27/16).  Per DM coordinator note, recommending 3 units Novolog TID.   Wt has been stable.   RD will re-visit pt as time allows for further education needs.  Body mass index is 26.18 kg/m. Patient meets criteria for overweight based on current BMI.   Current diet order is carb modified, patient is consuming approximately 50-75% of meals at this time. Labs and medications reviewed.   No nutrition interventions warranted at this time. If nutrition issues arise, please consult RD.   Xavier Munger A. Jimmye Norman, RD, LDN, CDE Pager: [phone] After hours Pager: [phone]

## 2016-10-01 LAB — TYPE AND SCREEN
ABO/RH(D): O NEG
Antibody Screen: POSITIVE
DAT, IGG: POSITIVE
UNIT DIVISION: 0
UNIT DIVISION: 0

## 2016-10-01 LAB — BASIC METABOLIC PANEL
ANION GAP: 5 (ref 5–15)
BUN: 22 mg/dL — ABNORMAL HIGH (ref 6–20)
CHLORIDE: 109 mmol/L (ref 101–111)
CO2: 25 mmol/L (ref 22–32)
Calcium: 8.6 mg/dL — ABNORMAL LOW (ref 8.9–10.3)
Creatinine, Ser: 0.89 mg/dL (ref 0.61–1.24)
Glucose, Bld: 170 mg/dL — ABNORMAL HIGH (ref 65–99)
POTASSIUM: 3.5 mmol/L (ref 3.5–5.1)
SODIUM: 139 mmol/L (ref 135–145)

## 2016-10-01 LAB — CBC WITH DIFFERENTIAL/PLATELET
BASOS ABS: 0 10*3/uL (ref 0.0–0.1)
Basophils Relative: 0 %
EOS PCT: 0 %
Eosinophils Absolute: 0 10*3/uL (ref 0.0–0.7)
HEMATOCRIT: 24.4 % — AB (ref 39.0–52.0)
HEMOGLOBIN: 8 g/dL — AB (ref 13.0–17.0)
LYMPHS ABS: 6.6 10*3/uL — AB (ref 0.7–4.0)
LYMPHS PCT: 40 %
MCH: 32.7 pg (ref 26.0–34.0)
MCHC: 32.8 g/dL (ref 30.0–36.0)
MCV: 99.6 fL (ref 78.0–100.0)
MONOS PCT: 10 %
Monocytes Absolute: 1.7 10*3/uL — ABNORMAL HIGH (ref 0.1–1.0)
NEUTROS PCT: 50 %
Neutro Abs: 8.3 10*3/uL — ABNORMAL HIGH (ref 1.7–7.7)
Platelets: 228 10*3/uL (ref 150–400)
RBC: 2.45 MIL/uL — AB (ref 4.22–5.81)
RDW: 26.4 % — AB (ref 11.5–15.5)
WBC: 16.6 10*3/uL — AB (ref 4.0–10.5)

## 2016-10-01 LAB — GLUCOSE, CAPILLARY
GLUCOSE-CAPILLARY: 98 mg/dL (ref 65–99)
GLUCOSE-CAPILLARY: 195 mg/dL — AB (ref 65–99)
Glucose-Capillary: 280 mg/dL — ABNORMAL HIGH (ref 65–99)
Glucose-Capillary: 274 mg/dL — ABNORMAL HIGH (ref 65–99)

## 2016-10-01 LAB — MAGNESIUM: MAGNESIUM: 2.3 mg/dL (ref 1.7–2.4)

## 2016-10-01 LAB — PHOSPHORUS
PHOSPHORUS: 4 mg/dL (ref 2.5–4.6)
PHOSPHORUS: 4.5 mg/dL (ref 2.5–4.6)

## 2016-10-01 MED ORDER — LEVOFLOXACIN 500 MG PO TABS
500.0000 mg | ORAL_TABLET | Freq: Every day | ORAL | Status: DC
  Administered 2016-10-02: 500 mg via ORAL
  Filled 2016-10-01: qty 1

## 2016-10-01 MED ORDER — INSULIN ASPART 100 UNIT/ML ~~LOC~~ SOLN
0.0000 [IU] | Freq: Three times a day (TID) | SUBCUTANEOUS | Status: DC
  Administered 2016-10-02: 2 [IU] via SUBCUTANEOUS

## 2016-10-01 NOTE — Progress Notes (Signed)
PROGRESS NOTE    Richard Evans  YCX:[id number] DOB: May 22, 1946 DOA: 09/27/2016 PCP: [name], MD   Brief Narrative:  Richard Evans is a 70 y.o. male with past medical history of hypertension and hemolytic anemia presents to emergency room with chief complaint of fatigue. Patient recently diagnosed with hemolytic anemia a few months ago. Was on high-dose steroids and recently tapered off. Recently completely off high-dose oral prednisone as of 2 weeks ago. Of note patient also recently diagnosed with pneumonia. Patient is followed by hematology oncology. They went to start patient on Rituxan but he was resistant due to his pneumonia. Given patient's gradual physical decline his family brought him to the emergency room for evaluation and he was admitted for His Autoimmune Hemolytic Anemia.   Assessment & Plan:   Principal Problem:   Hemolytic anemia (HCC) Active Problems:   Essential hypertension   BPH (benign prostatic hyperplasia)   Muscle weakness (generalized)  Autoimmune Hemolytic Anemia -Discussed Case with Dr. Julien Nordmann on Saturday who advised to continue Prednisone 1 mg/kg daily and add Dr. Alen Blew on the Treatment team so he can see patient Monday. He will then transfer care to Dr. Wynonia Lawman for second opinion by family Request. -Recommended Transfusing if Hb was less than 7.0 -Iron Panel Showed Iron of 88, UIBC of 181, TIBC of 269, Sat ratios of 33, Ferritin of 997, Folate of 20.7 -LDH was 508, Haptoglobin was <10, and Reticulocyte Count was 20.9; Di-Dimer was 1.75, and Fibrinogen was 604 -Patient given 80 mg of Prednisone in Ed;  -C/w Prednisone 90 mg daly as patient's Weight was 87.5 Kilograms.  -Monitor H/H to see if pt needs transfusion; Today's Hb/Hct showed 8.0/24.4 -Type and screen ordered  -Insert 2nd peripheral large bore IV  -Blood smear sent and showed Atypical Lymphocytes with Mild Left Shift -CT Abdomen and Pelvis showed Soft tissue mass in the central  mesenteries of the small bowel adjacent to the fourth portion the duodenum decreased in size from 06/30/2016. Patient has received steroid therapy for hemolytic anemia in the interval. Differential considerations would include extramedullary hematopoiesis of the mesenteries versus mesenteric panniculitis and less likely lymphoproliferative process.  Mild splenomegaly unchanged. Splenomegaly may also relate to extramedullary hematopoiesis. -Per Dr. Alen Blew patient to have Bone Marrow Biopsy at some point to complete workup to r/o Hematological Conditions -C/w Folic Acid 1 mg daily -PT to Eval and Treat and recommended no follow up -Dr. Alen Blew evaluated the patient and appreciate additional Recc's. Dr. Alen Blew recommened Splenectomy s. Rituximab and patient is hesitant to proceed with either of those options. Dr. Alen Blew to cancel Rituximab Infusions.  -Continue to Monitor Closely and Repeat CBC in AM -Likely to be D/C'd within the next few days if remains stable.    HTN -C/w Atenolol 85m qhs.    BPH -C/w Flomax 0.46mqd  Hyperglycemia in the setting of Prednisone Use -CBG ranging from 98-327; 170 on BMP -Will increase Sensitive Sliding Scale Insulin to Moderate with CBG Monitoring as patient is on Prednisone -Continue To Monitor and if BS are increased will increase Sliding Scale  Leukocytosis likely Demarginalization for Steroid Use in the setting of Community Acquired PNA  -Patient's WBC was 16.6 today (increased from 17.8) -D/C'd NS 0.9% at 75 mL/hr -Pt has Sob and recent Pneumonia after long term steroids -Changed Levaquin IV to po today (END DATE 10/03/16) -Chest Xray showed Ill-defined right upper lobe infiltrate. No new infiltrate identified. Atherosclerosis of the aorta. -Albuterol prn -Will attempt to  Notify Dr. Elsworth Soho that Patient is in the Hospital. -Repeat CBC in AM  DVT prophylaxis: SCD's; Ambulating  Code Status: Full Family Communication: Discussed Case with Wife at  Bedside Disposition Plan: Home when stable  Consultants:  -Phone Consultation with Dr. Julien Nordmann in Hematology/Oncology. Dr. Alen Blew patient's Primary Hematologist/Oncologist now Following  Procedures: None  Antimicrobials:  Levofloxacin IV D/C'd; Started Levofloxacin po (END DATE 10/03/16)  Subjective: Seen and examined at bedside and he was doing well. Had no active complaints overnight but stated that his BS was elevated. No CP/SOB/N/V or Abdominal Pain. The "pinch" in his back when he was diagnosed with Pneumonia has gone away.   Objective: Vitals:   10/01/16 0435 10/01/16 0959 10/01/16 1000 10/01/16 1704  BP: (!) 141/67 (!) 102/49 120/60 136/67  Pulse: 61 66 67 67  Resp: _0 Temp: 98.2 F (36.8 C) 97.7 F (36.5 C)  98.4 F (36.9 C)  TempSrc: Oral Oral  Oral  SpO2: 97% 97%  95%  Weight:      Height:       No intake or output data in the 24 hours ending 10/01/16 1734 Filed Weights   09/27/16 1339  Weight: 87.5 kg (193 lb)    Examination: Physical Exam:  Constitutional: WN/WD, NAD and appears calm and comfortable Eyes: Lids normal and conjunctiva a little pale, sclerae anicteric  ENMT: External Ears, Nose appear normal. Grossly normal hearing.  Neck: Appears normal, supple, no cervical masses, normal ROM, no appreciable thyromegaly Respiratory: CTAB with no wheezing, rales, rhonchi or crackles. Normal respiratory effort and patient is not tachypenic. No accessory muscle use.  Cardiovascular: RRR, no murmurs / rubs / gallops. S1 and S2 auscultated. No extremity edema.  Abdomen: Soft, non-tender, non-distended. No masses palpated. No appreciable hepatosplenomegaly. Bowel sounds positive x4.  GU: Deferred. Musculoskeletal: No clubbing / cyanosis of digits/nails. No joint deformity upper and lower extremities. No contractures. Normal strength and muscle tone. Right Arm knot noted in Antecubital Fossa again Skin: No rashes, lesions, ulcers. No induration; Warm and dry.   Neurologic: CN 2-12 grossly intact with no focal deficits. Sensation intact in all 4 Extremities. Strength 5/5 in all 4. Romberg sign cerebellar reflexes not assessed.  Psychiatric: Normal judgment and insight. Alert and oriented x 3. Normal mood and appropriate affect.   Data Reviewed: I have personally reviewed following labs and imaging studies  CBC:  Recent Labs Lab 09/27/16 1342 09/27/16 1543  09/28/16 0106  09/28/16 1430 09/29/16 0533 09/29/16 1520 09/30/16 0441 10/01/16 0358  WBC 12.7*  --   --  11.4*  --   --  16.6*  --  17.8* 16.6*  NEUTROABS  --   --   --   --   --   --  8.8*  --  8.9* 8.3*  HGB 8.3*  --   < > 9.8*  < > 7.5* 7.3* 8.8* 7.7* 8.0*  HCT 24.7*  --   < > 27.5*  < > 22.5* 22.7* 26.7* 23.2* 24.4*  MCV 92.9  --   --  90.5  --   --  98.7  --  97.5 99.6  PLT 275 292  --  243  --   --  233  --  239 228  < > = values in this interval not displayed. Basic Metabolic Panel:  Recent Labs Lab 09/27/16 1342 09/28/16 0106 09/29/16 0533 09/29/16 1520 09/30/16 0441 10/01/16 0358  NA 141 137 142  --  140 139  K  4.1 4.1 3.5  --  3.7 3.5  CL 112* 108 115*  --  110 109  CO2 _0 --  24 25  GLUCOSE 166* 287* 125*  --  157* 170*  BUN _1 --  22* 22*  CREATININE 0.89 0.89 0.83  --  0.97 0.89  CALCIUM 8.8* 8.5* 8.7*  --  8.4* 8.6*  MG  --   --  2.2  --  2.3 2.3  PHOS  --   --  2.9 3.0  --  4.0   GFR: Estimated Creatinine Clearance: 83.6 mL/min (by C-G formula based on SCr of 0.89 mg/dL). Liver Function Tests:  Recent Labs Lab 09/29/16 0533 09/30/16 0441  AST 25 26  ALT 18 20  ALKPHOS 59 63  BILITOT 2.3* 2.3*  PROT 6.1* 5.8*  ALBUMIN 3.3* 3.3*   No results for input(s): LIPASE, AMYLASE in the last 168 hours. No results for input(s): AMMONIA in the last 168 hours. Coagulation Profile:  Recent Labs Lab 09/27/16 1543  INR 1.19   Cardiac Enzymes: No results for input(s): CKTOTAL, CKMB, CKMBINDEX, TROPONINI in the last 168 hours. BNP (last 3  results) No results for input(s): PROBNP in the last 8760 hours. HbA1C: No results for input(s): HGBA1C in the last 72 hours. CBG:  Recent Labs Lab 09/30/16 1642 09/30/16 2157 10/01/16 0856 10/01/16 1214 10/01/16 1703  GLUCAP 327* 260* 98 195* 280*   Lipid Profile: No results for input(s): CHOL, HDL, LDLCALC, TRIG, CHOLHDL, LDLDIRECT in the last 72 hours. Thyroid Function Tests: No results for input(s): TSH, T4TOTAL, FREET4, T3FREE, THYROIDAB in the last 72 hours. Anemia Panel: No results for input(s): VITAMINB12, FOLATE, FERRITIN, TIBC, IRON, RETICCTPCT in the last 72 hours. Sepsis Labs: No results for input(s): PROCALCITON, LATICACIDVEN in the last 168 hours.  No results found for this or any previous visit (from the past 240 hour(s)).   Radiology Studies: No results found. Scheduled Meds: . atenolol  25 mg Oral QHS  . diphenhydrAMINE  25 mg Oral QHS  . folic acid  1 mg Oral Daily  . insulin aspart  0-9 Units Subcutaneous TID WC  . [START ON 10/02/2016] levofloxacin  500 mg Oral Daily  . predniSONE  90 mg Oral Q breakfast  . tamsulosin  0.4 mg Oral QHS   Continuous Infusions:    LOS: 3 days   Richard Elbe, DO [facility] Pager [phone]  If 7PM-7AM, please contact night-coverage [email or web address] Password [facility] 10/01/2016, 5:34 PM

## 2016-10-01 NOTE — Progress Notes (Signed)
IP PROGRESS NOTE  Subjective:   Mr. Ende is feeling better today and was able to ambulate without any major difficulties. He denied any chest pain, shortness of breath or productive cough. No hematochezia or melena.  Objective:  Vital signs in last 24 hours: Temp:  [97.7 F (36.5 C)-98.2 F (36.8 C)] 97.7 F (36.5 C) (10/31 0959) Pulse Rate:  [61-67] 67 (10/31 1000) Resp:  [17-18] 17 (10/31 0959) BP: (102-141)/(49-70) 120/60 (10/31 1000) SpO2:  [95 %-97 %] 97 % (10/31 0959) Weight change:  Last BM Date: 10/01/16  Intake/Output from previous day: 10/30 0701 - 10/31 0700 In: 100 [IV Piggyback:100] Out: -  Alert, awake gentleman. Well-appearing without distress. Mouth: mucous membranes moist, pharynx normal without lesions Resp: clear to auscultation bilaterally Cardio: regular rate and rhythm, S1, S2 normal, no murmur, click, rub or gallop GI: soft, non-tender; bowel sounds normal; no masses,  no organomegaly Extremities: extremities normal, atraumatic, no cyanosis or edema    Lab Results:  Recent Labs  09/30/16 0441 10/01/16 0358  WBC 17.8* 16.6*  HGB 7.7* 8.0*  HCT 23.2* 24.4*  PLT 239 228    BMET  Recent Labs  09/30/16 0441 10/01/16 0358  NA 140 139  K 3.7 3.5  CL 110 109  CO2 24 25  GLUCOSE 157* 170*  BUN 22* 22*  CREATININE 0.97 0.89  CALCIUM 8.4* 8.6*    Medications: I have reviewed the patient's current medications.  Assessment/Plan:  70 year old gentleman with the following issues:  1. Autoimmune hemolytic anemia responsive to prednisone although relapsed rather quickly after prednisone taper. I agree with restarting prednisone at the current dose and monitoring his counts daily.  His hemoglobin appears to be stable and does not require transfusion.  2. Evaluation for a possible hematological disorder: His CT scan of the abdomen did show mild splenomegaly which could be related to autoimmune hemolysis but could also be due to a  lymphoproliferative disorder or myeloproliferative disorder.   Bone marrow biopsy would be required at some point to complete his workup to rule out a hematological condition as the cause of his autoimmune hemolysis. This was discussed with the patient and we will consider that in the future and certainly not during this hospitalization.  3. Community-acquired pneumonia: I agree with the current management with intravenous antibiotics.  4. Disposition: He is close to being discharged in the next 24-48 hours. I have no objections to discharge she is medically stable within that timeframe. I will arrange follow-up with weekly CBC at the [facility].   LOS: 3 days   [id number] 10/01/2016, 2:57 PM

## 2016-10-02 LAB — COMPREHENSIVE METABOLIC PANEL
ALT: 19 U/L (ref 17–63)
AST: 24 U/L (ref 15–41)
Albumin: 3.4 g/dL — ABNORMAL LOW (ref 3.5–5.0)
Alkaline Phosphatase: 63 U/L (ref 38–126)
Anion gap: 7 (ref 5–15)
BUN: 24 mg/dL — ABNORMAL HIGH (ref 6–20)
CALCIUM: 8.9 mg/dL (ref 8.9–10.3)
CHLORIDE: 108 mmol/L (ref 101–111)
CO2: 28 mmol/L (ref 22–32)
CREATININE: 1.04 mg/dL (ref 0.61–1.24)
Glucose, Bld: 106 mg/dL — ABNORMAL HIGH (ref 65–99)
Potassium: 3.9 mmol/L (ref 3.5–5.1)
Sodium: 143 mmol/L (ref 135–145)
Total Bilirubin: 2.6 mg/dL — ABNORMAL HIGH (ref 0.3–1.2)
Total Protein: 5.9 g/dL — ABNORMAL LOW (ref 6.5–8.1)

## 2016-10-02 LAB — CBC WITH DIFFERENTIAL/PLATELET
BASOS PCT: 0 %
Basophils Absolute: 0 10*3/uL (ref 0.0–0.1)
EOS ABS: 0 10*3/uL (ref 0.0–0.7)
Eosinophils Relative: 0 %
HCT: 27.2 % — ABNORMAL LOW (ref 39.0–52.0)
Hemoglobin: 8.5 g/dL — ABNORMAL LOW (ref 13.0–17.0)
LYMPHS ABS: 7.1 10*3/uL — AB (ref 0.7–4.0)
LYMPHS PCT: 50 %
MCH: 32.6 pg (ref 26.0–34.0)
MCHC: 31.3 g/dL (ref 30.0–36.0)
MCV: 104.2 fL — AB (ref 78.0–100.0)
MONO ABS: 1.3 10*3/uL — AB (ref 0.1–1.0)
Monocytes Relative: 9 %
NEUTROS ABS: 5.8 10*3/uL (ref 1.7–7.7)
Neutrophils Relative %: 41 %
PLATELETS: 195 10*3/uL (ref 150–400)
RBC: 2.61 MIL/uL — ABNORMAL LOW (ref 4.22–5.81)
RDW: 27.4 % — ABNORMAL HIGH (ref 11.5–15.5)
WBC: 14.2 10*3/uL — ABNORMAL HIGH (ref 4.0–10.5)

## 2016-10-02 LAB — GLUCOSE, CAPILLARY
Glucose-Capillary: 91 mg/dL (ref 65–99)
Glucose-Capillary: 133 mg/dL — ABNORMAL HIGH (ref 65–99)

## 2016-10-02 LAB — MAGNESIUM: MAGNESIUM: 2.4 mg/dL (ref 1.7–2.4)

## 2016-10-02 MED ORDER — PREDNISONE 20 MG PO TABS: 90.0000 mg | ORAL_TABLET | Freq: Every day | ORAL | 0 refills | Status: DC

## 2016-10-02 MED ORDER — LEVOFLOXACIN 500 MG PO TABS: 500.0000 mg | ORAL_TABLET | Freq: Once | ORAL | 0 refills | Status: AC

## 2016-10-02 NOTE — Discharge Summary (Signed)
Physician Discharge Summary  Richard Evans JOI:[id number] DOB: 22-Oct-1946 DOA: 09/27/2016  PCP: [name], MD  Admit date: 09/27/2016 Discharge date: 10/02/2016  Admitted From: Home Disposition:  Home  Recommendations for Outpatient Follow-up:  1. Follow up with PCP in 1  Week 2. Follow-up with hematologist in 1 week 3. Workup for soft tissue mass in central mesenteries of small bowel and splenomegaly 4. Repeat CBC in one week 5. Bone marrow biopsy as outpatient   Discharge Condition: Stable CODE STATUS: Full code   Brief/Interim Summary:  HPI written by Dr. Vernona Rieger on 09/27/2016   Hospital course:  Autoimmune hemolytic anemia Patient's hemoglobin 8.3 on admission. No evidence of bleeding. Patient restarted on steroids at 11m/kg (initially 879mtitrated up to 9028m Initial hemoglobin drop to 7.5 and remained stable for a few days before improving to 9.6 on discharge. Case discussed with patient's hematologist for recommendations and patient consulted on. Rituximab infusion started but eventually stopped since he is acutely ill. Blood pathology significant for atypical immature mononuclear cells, macrocytic anemia with polychromasia, spherocytes, nucleated RBCs and few schistocytes.. Patient to follow-up outpatient with hematology.  Hypertension Continued atenolol  BPH Continued flomax  Hyperglycemia secondary to prednisone use Sliding scale insulin  Leukocytosis Community acquired pneumonia Conitnued Levaquin on admission. Discharged to finish course. Last dose 11/2   Discharge Diagnoses:  Principal Problem:   Hemolytic anemia (HCCOakvillective Problems:   Essential hypertension   CAP (community acquired pneumonia)   BPH (benign prostatic hyperplasia)   Muscle weakness (generalized)    Discharge Instructions     Medication List    STOP taking these medications   diphenhydrAMINE 25 MG tablet Commonly known as:  BENADRYL   ibuprofen 200 MG  tablet Commonly known as:  ADVIL,MOTRIN     TAKE these medications   aspirin 81 MG EC tablet Take 1 tablet (81 mg total) by mouth daily. What changed:  when to take this   atenolol 25 MG tablet Commonly known as:  TENORMIN Take 25 mg by mouth at bedtime.   atorvastatin 10 MG tablet Commonly known as:  LIPITOR Take 10 mg by mouth daily.   CLEARLAX powder Generic drug:  polyethylene glycol powder Take 17 g by mouth daily as needed (constipation).   FLOMAX 0.4 MG Caps capsule Generic drug:  tamsulosin Take 0.4 mg by mouth at bedtime.   glipiZIDE 5 MG 24 hr tablet Commonly known as:  GLUCOTROL XL Take 5 mg by mouth daily before breakfast.   levofloxacin 500 MG tablet Commonly known as:  LEVAQUIN Take 1 tablet (500 mg total) by mouth once. 10 day course started 09/24/16 Start taking on:  10/03/2016 What changed:  when to take this   Magnesium 500 MG Tabs Take 500 mg by mouth at bedtime as needed (leg cramps).   POTASSIUM GLUCONATE PO Take 1 tablet by mouth at bedtime as needed (leg cramps).   predniSONE 20 MG tablet Commonly known as:  DELTASONE Take 4.5 tablets (90 mg total) by mouth daily with breakfast. Start taking on:  10/03/2016   promethazine 25 MG tablet Commonly known as:  PHENERGAN Take 6.25-12.5 mg by mouth 3 (three) times daily as needed for nausea or vomiting. 1/4- 1/2 tablet      Follow-up Information    DhrGlenda ChromanD. Schedule an appointment as soon as possible for a visit in 1 week(s).   Specialty:  Internal Medicine Contact information: [phone]  SHADAD,FIRAS, MD. Schedule an appointment as soon as possible for a visit in 1 week(s).   Specialty:  Oncology Contact information: [place] [place] [address] [phone]          Allergies  Allergen Reactions  . Adhesive [Tape] Itching and Rash    Paper tape or wrapped bandage is preferred     Consultations:  Hematology   Procedures/Studies: Dg Chest 2 View  Result Date: 09/28/2016 CLINICAL DATA:  Shortness of breath EXAM: CHEST  2 VIEW COMPARISON:  CT 09/23/2016 FINDINGS: There is an ill-defined infiltrate in the right upper lobe. There is linear scarring or atelectasis at the left base. No effusion. Cardiomediastinal silhouette is nonenlarged. Atherosclerosis of the aorta. No pneumothorax. IMPRESSION: Ill-defined right upper lobe infiltrate. No new infiltrate identified. Atherosclerosis of the aorta. Electronically Signed   By: Donavan Foil M.D.   On: 09/28/2016 04:35   Ct Abdomen Pelvis W Contrast  Result Date: 09/25/2016 CLINICAL DATA:  Hemolytic anemia,  splenomegaly. EXAM: CT ABDOMEN AND PELVIS WITH CONTRAST TECHNIQUE: Multidetector CT imaging of the abdomen and pelvis was performed using the standard protocol following bolus administration of intravenous contrast. CONTRAST:  189m ISOVUE-300 IOPAMIDOL (ISOVUE-300) INJECTION 61% COMPARISON:  None. FINDINGS: Lower chest: Lung bases are clear. Hepatobiliary: No focal hepatic lesion. No biliary duct dilatation. Gallbladder is normal. Common bile duct is normal. Pancreas: Pancreas is normal.  No duct dilatation. Spleen: Spleen is mildly enlarged with a calculated volume of 12.7 x 5.9 x 13.5 cm (volume = 530 cm^3) Adrenals/urinary tract: Adrenal glands normal. Simple fluid attenuation lesion RIGHT kidney. Ureters bladder normal. 2.6 cm LEFT bladder diverticulum Stomach/Bowel: The stomach, duodenum, small-bowel, appendix cecum normal. Along the medial border of the third portion the duodenum there is a oblong soft tissue mass measuring 3.6 x 2.4 cm which is decreased in size from 4.1 by 3.4 cm on CT 06/30/2016. This lesion is immediately adjacent to the small bowel but does not appear to communicate with the small bowel and resides in the central mesenteries of the small bowel (image 35, series 2). The ascending, transverse, and  descending normal. There are multiple diverticula sigmoid colon without acute inflammation. Vascular/Lymphatic: Abdominal aorta is normal caliber with atherosclerotic calcification. There is no retroperitoneal or periportal lymphadenopathy. No pelvic lymphadenopathy. No retroperitoneal lymphadenopathy. Reproductive: Prostate normal. Small fat filled inguinal hernias for Other: No aggressive osseous lesion. Musculoskeletal: No aggressive osseous lesion. IMPRESSION: 1. Soft tissue mass in the central mesenteries of the small bowel adjacent to the fourth portion the duodenum decreased in size from 06/30/2016. Patient has received steroid therapy for hemolytic anemia in the interval. Differential considerations would include extramedullary hematopoiesis of the mesenteries versus mesenteric panniculitis and less likely lymphoproliferative process. 2. Mild splenomegaly unchanged. Splenomegaly may also relate to extramedullary hematopoiesis. 3. Sigmoid diverticulosis without diverticulitis. 4. Probable benign cysts of the RIGHT kidney Electronically Signed   By: SSuzy BouchardM.D.   On: 09/25/2016 09:59     Subjective: Patient reports feeling better since starting prednisone. No concerns today  Discharge Exam: Vitals:   10/01/16 2140 10/02/16 0420  BP: (!) 148/73 (!) 157/80  Pulse: 64 (!) 59  Resp:  18  Temp: 97.5 F (36.4 C) 98.1 F (36.7 C)   Vitals:   10/01/16 1000 10/01/16 1704 10/01/16 2140 10/02/16 0420  BP: 120/60 136/67 (!) 148/73 (!) 157/80  Pulse: 67 67 64 (!) 59  Resp:  16  18  Temp:  98.4 F (36.9 C) 97.5 F (36.4  C) 98.1 F (36.7 C)  TempSrc:  Oral Oral Oral  SpO2:  95% 95% 97%  Weight:      Height:        General: Pt is alert, awake, not in acute distress Cardiovascular: RRR, S1/S2 +, no rubs, no gallops Respiratory: CTA bilaterally, no wheezing, no rhonchi Abdominal: Soft, NT, ND, bowel sounds + Extremities: no edema, no cyanosis    The results of significant  diagnostics from this hospitalization (including imaging, microbiology, ancillary and laboratory) are listed below for reference.     Microbiology: No results found for this or any previous visit (from the past 240 hour(s)).   Labs: BNP (last 3 results)  Recent Labs  06/30/16 1200  BNP 518.8*   Basic Metabolic Panel:  Recent Labs Lab 09/28/16 0106 09/29/16 0533 09/29/16 1520 09/30/16 0441 10/01/16 0358 10/01/16 2038 10/02/16 0606  NA 137 142  --  140 139  --  143  K 4.1 3.5  --  3.7 3.5  --  3.9  CL 108 115*  --  110 109  --  108  CO2 22 22  --  24 25  --  28  GLUCOSE 287* 125*  --  157* 170*  --  106*  BUN 18 19  --  22* 22*  --  24*  CREATININE 0.89 0.83  --  0.97 0.89  --  1.04  CALCIUM 8.5* 8.7*  --  8.4* 8.6*  --  8.9  MG  --  2.2  --  2.3 2.3  --  2.4  PHOS  --  2.9 3.0  --  4.0 4.5  --    Liver Function Tests:  Recent Labs Lab 09/29/16 0533 09/30/16 0441 10/02/16 0606  AST _0 ALT _1 ALKPHOS 59 63 63  BILITOT 2.3* 2.3* 2.6*  PROT 6.1* 5.8* 5.9*  ALBUMIN 3.3* 3.3* 3.4*   No results for input(s): LIPASE, AMYLASE in the last 168 hours. No results for input(s): AMMONIA in the last 168 hours. CBC:  Recent Labs Lab 09/28/16 0106  09/29/16 0533 09/29/16 1520 09/30/16 0441 10/01/16 0358 10/02/16 0606  WBC 11.4*  --  16.6*  --  17.8* 16.6* 14.2*  NEUTROABS  --   --  8.8*  --  8.9* 8.3* 5.8  HGB 9.8*  < > 7.3* 8.8* 7.7* 8.0* 8.5*  HCT 27.5*  < > 22.7* 26.7* 23.2* 24.4* 27.2*  MCV 90.5  --  98.7  --  97.5 99.6 104.2*  PLT 243  --  233  --  239 228 195  < > = values in this interval not displayed. Cardiac Enzymes: No results for input(s): CKTOTAL, CKMB, CKMBINDEX, TROPONINI in the last 168 hours. BNP: Invalid input(s): POCBNP CBG:  Recent Labs Lab 10/01/16 1214 10/01/16 1703 10/01/16 2139 10/02/16 0744 10/02/16 1151  GLUCAP 195* 280* 274* 91 133*   D-Dimer No results for input(s): DDIMER in the last 72 hours. Hgb A1c No  results for input(s): HGBA1C in the last 72 hours. Lipid Profile No results for input(s): CHOL, HDL, LDLCALC, TRIG, CHOLHDL, LDLDIRECT in the last 72 hours. Thyroid function studies No results for input(s): TSH, T4TOTAL, T3FREE, THYROIDAB in the last 72 hours.  Invalid input(s): FREET3 Anemia work up No results for input(s): VITAMINB12, FOLATE, FERRITIN, TIBC, IRON, RETICCTPCT in the last 72 hours. Urinalysis    Component Value Date/Time   COLORURINE AMBER (A) 06/30/2016 1406   APPEARANCEUR CLEAR 06/30/2016 1406   LABSPEC  1.018 06/30/2016 1406   PHURINE 5.5 06/30/2016 1406   GLUCOSEU NEGATIVE 06/30/2016 1406   HGBUR NEGATIVE 06/30/2016 1406   BILIRUBINUR SMALL (A) 06/30/2016 1406   KETONESUR NEGATIVE 06/30/2016 1406   PROTEINUR NEGATIVE 06/30/2016 1406   NITRITE NEGATIVE 06/30/2016 1406   LEUKOCYTESUR NEGATIVE 06/30/2016 1406   Sepsis Labs Invalid input(s): PROCALCITONIN,  WBC,  LACTICIDVEN Microbiology No results found for this or any previous visit (from the past 240 hour(s)).   Time coordinating discharge: Over 30 minutes  SIGNED:   Cordelia Poche, MD [facility] 10/02/2016, 2:26 PM Pager [phone]  If 7PM-7AM, please contact night-coverage [email or web address] Password [email or web address]

## 2016-10-02 NOTE — Care Management Important Message (Signed)
Important Message  Patient Details  Name: Richard Evans MRN: [id number] Date of Birth: 11-29-1946   Medicare Important Message Given:  Yes    [name] 10/02/2016, 10:31 AM

## 2016-10-02 NOTE — Care Management Note (Signed)
Case Management Note  Patient Details  Name: Richard Evans MRN: [id number] Date of Birth: 11-18-1946  Subjective/Objective:                 Patient admitted with anemia. From home with spouse. No CM needs identified.  Action/Plan:  DC to home, self care.  Expected Discharge Date:                  Expected Discharge Plan:  Home/Self Care  In-House Referral:  NA  Discharge planning Services  CM Consult  Post Acute Care Choice:  NA Choice offered to:  NA  DME Arranged:  N/A DME Agency:  NA  HH Arranged:  NA HH Agency:  NA  Status of Service:  Completed, signed off  If discussed at [place] of Stay Meetings, dates discussed:    Additional Comments:  Carles Collet, RN 10/02/2016, 2:49 PM

## 2016-10-02 NOTE — Progress Notes (Signed)
Inpatient Diabetes Program Recommendations  AACE/ADA: New Consensus Statement on Inpatient Glycemic Control (2015)  Target Ranges:  Prepandial:   less than 140 mg/dL      Peak postprandial:   less than 180 mg/dL (1-2 hours)      Critically ill patients:  140 - 180 mg/dL   Lab Results  Component Value Date   GLUCAP 91 10/02/2016   HGBA1C <4.2 (L) 09/28/2016    Review of Glycemic Control  Diabetes history: DM 2 Outpatient Diabetes medications: Glipizide 5 mg Daily Current orders for Inpatient glycemic control: Novolog Moderate scale TID  Inpatient Diabetes Program Recommendations:   Patient on PO prednisone 90 mg Daily and has postprandial elevations in the 290's. Please consider Novolog 3 units TID meal coverage in addition of Novolog Correction scale if patient consumes at least 50% of meals.  Thanks,  Tama Headings RN, MSN, [facility] Inpatient Diabetes Coordinator Team Pager [phone] (8a-5p)

## 2016-10-02 NOTE — Discharge Instructions (Signed)
Mr. Aburto, you were admitted because you were found to have low blood counts secondary to hemolytic anemia. Your steroids were restarted and we continued to treat your pneumonia infection. You improved significantly since restarting steroids. Your oncologist saw you and had recommendations for you regarding your spleen, bone biopsy and abdominal mass (which is smaller than previously) to follow-up outpatient. Please continue on your high dose steroids for now until Dr. Alen Blew is ready to wean you down.

## 2016-10-03 ENCOUNTER — Other Ambulatory Visit (HOSPITAL_BASED_OUTPATIENT_CLINIC_OR_DEPARTMENT_OTHER): Payer: Medicare Other

## 2016-10-03 ENCOUNTER — Ambulatory Visit: Payer: Medicare Other

## 2016-10-03 DIAGNOSIS — D588 Other specified hereditary hemolytic anemias: Secondary | ICD-10-CM

## 2016-10-03 DIAGNOSIS — R161 Splenomegaly, not elsewhere classified: Secondary | ICD-10-CM

## 2016-10-03 LAB — CBC WITH DIFFERENTIAL/PLATELET
BASO%: 0.1 % (ref 0.0–2.0)
Basophils Absolute: 0 10*3/uL (ref 0.0–0.1)
EOS%: 0.2 % (ref 0.0–7.0)
Eosinophils Absolute: 0 10*3/uL (ref 0.0–0.5)
HEMATOCRIT: 29.6 % — AB (ref 38.4–49.9)
HEMOGLOBIN: 9.6 g/dL — AB (ref 13.0–17.1)
LYMPH#: 5.3 10*3/uL — AB (ref 0.9–3.3)
LYMPH%: 42.9 % (ref 14.0–49.0)
MCH: 33.1 pg (ref 27.2–33.4)
MCHC: 32.4 g/dL (ref 32.0–36.0)
MCV: 102.1 fL — ABNORMAL HIGH (ref 79.3–98.0)
MONO#: 1.4 10*3/uL — AB (ref 0.1–0.9)
MONO%: 11.6 % (ref 0.0–14.0)
NEUT#: 5.6 10*3/uL (ref 1.5–6.5)
NEUT%: 45.2 % (ref 39.0–75.0)
PLATELETS: 199 10*3/uL (ref 140–400)
RBC: 2.9 10*6/uL — ABNORMAL LOW (ref 4.20–5.82)
RDW: 26.4 % — AB (ref 11.0–14.6)
WBC: 12.4 10*3/uL — ABNORMAL HIGH (ref 4.0–10.3)

## 2016-10-03 LAB — COMPREHENSIVE METABOLIC PANEL
ALBUMIN: 3.5 g/dL (ref 3.5–5.0)
ALK PHOS: 80 U/L (ref 40–150)
ALT: 18 U/L (ref 0–55)
ANION GAP: 10 meq/L (ref 3–11)
AST: 18 U/L (ref 5–34)
BILIRUBIN TOTAL: 2.45 mg/dL — AB (ref 0.20–1.20)
BUN: 26.5 mg/dL — AB (ref 7.0–26.0)
CALCIUM: 8.8 mg/dL (ref 8.4–10.4)
CHLORIDE: 108 meq/L (ref 98–109)
CO2: 24 mEq/L (ref 22–29)
CREATININE: 1 mg/dL (ref 0.7–1.3)
EGFR: 80 mL/min/{1.73_m2} — ABNORMAL LOW (ref >90)
Glucose: 131 mg/dl (ref 70–140)
Potassium: 3.4 mEq/L — ABNORMAL LOW (ref 3.5–5.1)
Sodium: 142 mEq/L (ref 136–145)
TOTAL PROTEIN: 6.3 g/dL — AB (ref 6.4–8.3)

## 2016-10-03 LAB — TECHNOLOGIST REVIEW

## 2016-10-03 LAB — PATHOLOGIST SMEAR REVIEW

## 2016-10-03 LAB — LACTATE DEHYDROGENASE: LDH: 531 U/L — AB (ref 125–245)

## 2016-10-08 DIAGNOSIS — J189 Pneumonia, unspecified organism: Secondary | ICD-10-CM | POA: Diagnosis not present

## 2016-10-09 DIAGNOSIS — J181 Lobar pneumonia, unspecified organism: Secondary | ICD-10-CM | POA: Diagnosis not present

## 2016-10-09 DIAGNOSIS — E1165 Type 2 diabetes mellitus with hyperglycemia: Secondary | ICD-10-CM | POA: Diagnosis not present

## 2016-10-09 DIAGNOSIS — D589 Hereditary hemolytic anemia, unspecified: Secondary | ICD-10-CM | POA: Diagnosis not present

## 2016-10-09 DIAGNOSIS — Z09 Encounter for follow-up examination after completed treatment for conditions other than malignant neoplasm: Secondary | ICD-10-CM | POA: Diagnosis not present

## 2016-10-10 ENCOUNTER — Other Ambulatory Visit (HOSPITAL_BASED_OUTPATIENT_CLINIC_OR_DEPARTMENT_OTHER): Payer: Medicare Other

## 2016-10-10 ENCOUNTER — Ambulatory Visit: Payer: Medicare Other

## 2016-10-10 ENCOUNTER — Telehealth: Payer: Self-pay | Admitting: *Deleted

## 2016-10-10 DIAGNOSIS — D588 Other specified hereditary hemolytic anemias: Secondary | ICD-10-CM | POA: Diagnosis present

## 2016-10-10 DIAGNOSIS — R161 Splenomegaly, not elsewhere classified: Secondary | ICD-10-CM

## 2016-10-10 LAB — CBC WITH DIFFERENTIAL/PLATELET
BASO%: 0.4 % (ref 0.0–2.0)
Basophils Absolute: 0 10*3/uL (ref 0.0–0.1)
EOS ABS: 0 10*3/uL (ref 0.0–0.5)
EOS%: 0.4 % (ref 0.0–7.0)
HCT: 35.5 % — ABNORMAL LOW (ref 38.4–49.9)
HGB: 12 g/dL — ABNORMAL LOW (ref 13.0–17.1)
LYMPH%: 23.9 % (ref 14.0–49.0)
MCH: 34.1 pg — ABNORMAL HIGH (ref 27.2–33.4)
MCHC: 33.7 g/dL (ref 32.0–36.0)
MCV: 101.3 fL — AB (ref 79.3–98.0)
MONO#: 0.4 10*3/uL (ref 0.1–0.9)
MONO%: 4.6 % (ref 0.0–14.0)
NEUT%: 70.7 % (ref 39.0–75.0)
NEUTROS ABS: 6.6 10*3/uL — AB (ref 1.5–6.5)
Platelets: 193 10*3/uL (ref 140–400)
RBC: 3.5 10*6/uL — AB (ref 4.20–5.82)
RDW: 19.7 % — ABNORMAL HIGH (ref 11.0–14.6)
WBC: 9.3 10*3/uL (ref 4.0–10.3)
lymph#: 2.2 10*3/uL (ref 0.9–3.3)

## 2016-10-10 NOTE — Telephone Encounter (Signed)
Per Dr. Alen Blew, his CBC looks good. Please continue the same dose of Prednisone. Wife verbalized understanding.

## 2016-10-17 ENCOUNTER — Other Ambulatory Visit (HOSPITAL_BASED_OUTPATIENT_CLINIC_OR_DEPARTMENT_OTHER): Payer: Medicare Other

## 2016-10-17 ENCOUNTER — Ambulatory Visit (HOSPITAL_BASED_OUTPATIENT_CLINIC_OR_DEPARTMENT_OTHER): Payer: Medicare Other | Admitting: Oncology

## 2016-10-17 ENCOUNTER — Telehealth: Payer: Self-pay | Admitting: Oncology

## 2016-10-17 ENCOUNTER — Ambulatory Visit: Payer: Medicare Other

## 2016-10-17 VITALS — BP 119/61 | HR 51 | Temp 98.7°F | Resp 17 | Ht 72.0 in | Wt 193.1 lb

## 2016-10-17 DIAGNOSIS — D588 Other specified hereditary hemolytic anemias: Secondary | ICD-10-CM

## 2016-10-17 DIAGNOSIS — R161 Splenomegaly, not elsewhere classified: Secondary | ICD-10-CM

## 2016-10-17 DIAGNOSIS — D599 Acquired hemolytic anemia, unspecified: Secondary | ICD-10-CM

## 2016-10-17 LAB — CBC WITH DIFFERENTIAL/PLATELET
BASO%: 0.1 % (ref 0.0–2.0)
Basophils Absolute: 0 10*3/uL (ref 0.0–0.1)
EOS ABS: 0.1 10*3/uL (ref 0.0–0.5)
EOS%: 0.7 % (ref 0.0–7.0)
HEMATOCRIT: 39.6 % (ref 38.4–49.9)
HGB: 13.4 g/dL (ref 13.0–17.1)
LYMPH%: 35.9 % (ref 14.0–49.0)
MCH: 33.3 pg (ref 27.2–33.4)
MCHC: 33.8 g/dL (ref 32.0–36.0)
MCV: 98.3 fL — ABNORMAL HIGH (ref 79.3–98.0)
MONO#: 0.5 10*3/uL (ref 0.1–0.9)
MONO%: 5.2 % (ref 0.0–14.0)
NEUT#: 5.9 10*3/uL (ref 1.5–6.5)
NEUT%: 58.1 % (ref 39.0–75.0)
PLATELETS: 95 10*3/uL — AB (ref 140–400)
RBC: 4.03 10*6/uL — ABNORMAL LOW (ref 4.20–5.82)
RDW: 15.8 % — ABNORMAL HIGH (ref 11.0–14.6)
WBC: 10.2 10*3/uL (ref 4.0–10.3)
lymph#: 3.6 10*3/uL — ABNORMAL HIGH (ref 0.9–3.3)
nRBC: 0 % (ref 0–0)

## 2016-10-17 MED ORDER — FOLIC ACID 1 MG PO TABS: 1.0000 mg | ORAL_TABLET | Freq: Every day | ORAL | 1 refills | Status: DC

## 2016-10-17 NOTE — Telephone Encounter (Signed)
Appointments scheduled per 10/17/16 los. Copy of AVS report and appointment schedule was given to patient, per 10/17/16 los.

## 2016-10-17 NOTE — Progress Notes (Signed)
Hematology and Oncology Follow Up Visit  Richard Evans [id number] Nov 18, 1946 70 y.o. 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 He presented with hemoglobin of 7.5, MCV 106 with elevated reticulocyte count and haptoglobin less than 10. Coombs testing was positive. CT scan of the chest abdomen and pelvis showed nonspecific hilar adenopathy.   Prior Therapy:   Status post packed red cell transfusion for 1 unit on 07/04/2016. Prednisone 70 mg daily started on 06/30/2016. He achieved complete response and prednisone tapered completely off on 09/11/2016. Sequelae had a chronic relapse with worsening anemia.  Current therapy: Prednisone 90 mg daily started on 09/30/2016. He achieved complete response on 10/17/2016.  Interim History:  Richard Evans presents today for a follow-up visit. Since the last visit, he reports feeling reasonably well and completely recovered from his recent hospitalization. He was hospitalized briefly at the end of October for worsening anemia and community-acquired pneumonia. He was treated with intravenous antibiotics and was restarted on prednisone. Since his discharge, his hemoglobin have completely normalized and does not report any residual symptoms.   He denied any fatigue, tiredness or dyspnea on exertion. He remained active and attends to activities of daily living. He does not report any fevers, chills or sweats. He does not report any constitutional symptoms. He denied any cough or hemoptysis. He is tolerating prednisone at the current dose without any residual complications. His blood sugar has reasonably maintained on glipizide.   He does not report any headaches, blurry vision, syncope or seizures. He is not report any fevers or chills or weight loss. He does not report any chest pain, palpitation, orthopnea or leg edema. He does not report any cough,  wheezing or hemoptysis. He does not report any nausea, vomiting or abdominal pain. He does not report any frequency urgency or hesitancy. He has not reported any skeletal complaints. Remaining review of systems unremarkable   Medications: I have reviewed the patient's current medications.  Current Outpatient Prescriptions  Medication Sig Dispense Refill  . aspirin EC 81 MG EC tablet Take 1 tablet (81 mg total) by mouth daily. (Patient taking differently: Take 81 mg by mouth at bedtime. ) 30 tablet 0  . atenolol (TENORMIN) 25 MG tablet Take 25 mg by mouth at bedtime.     Marland Kitchen atorvastatin (LIPITOR) 10 MG tablet Take 10 mg by mouth daily.    . folic acid (FOLVITE) 1 MG tablet Take 1 tablet (1 mg total) by mouth daily. 30 tablet 1  . glipiZIDE (GLUCOTROL XL) 5 MG 24 hr tablet Take 5 mg by mouth daily before breakfast.     . Magnesium 500 MG TABS Take 500 mg by mouth at bedtime as needed (leg cramps).     . polyethylene glycol powder (CLEARLAX) powder Take 17 g by mouth daily as needed (constipation).    Marland Kitchen POTASSIUM GLUCONATE PO Take 1 tablet by mouth at bedtime as needed (leg cramps).    . predniSONE (DELTASONE) 20 MG tablet Take 4.5 tablets (90 mg total) by mouth daily with breakfast. 135 tablet 0  . promethazine (PHENERGAN) 25 MG tablet Take 6.25-12.5 mg by mouth 3 (three) times daily as needed for nausea or vomiting. 1/4- 1/2 tablet    . Tamsulosin HCl (FLOMAX) 0.4 MG CAPS Take 0.4 mg by mouth at bedtime.      No current facility-administered medications for this visit.  Allergies:  Allergies  Allergen Reactions  . Adhesive [Tape] Itching and Rash    Paper tape or wrapped bandage is preferred    Past Medical History, Surgical history, Social history, and Family History were reviewed and updated.  Physical Exam: Blood pressure 119/61, pulse (!) 51, temperature 98.7 F (37.1 C), temperature source Oral, resp. rate 17, height 6' (1.829 m), weight 193 lb 1.6 oz (87.6 kg), SpO2 96  %. ECOG: 0 General appearance: Well-appearing gentleman without distress. Head: Normocephalic, without obvious abnormality. No oral ulcers or lesions. Neck: no adenopathy Lymph nodes: Cervical, supraclavicular, and axillary nodes normal. Heart:regular rate and rhythm, S1, S2 normal, no murmur, click, rub or gallop Lung:chest clear, no wheezing, rales, normal symmetric air entry.  Abdomin: soft, non-tender, without masses or organomegalyI. No  splenomegaly.  EXT: No edema or erythema.   Lab Results: Lab Results  Component Value Date   WBC 10.2 10/17/2016   HGB 13.4 10/17/2016   HCT 39.6 10/17/2016   MCV 98.3 (H) 10/17/2016   PLT 95 (L) 10/17/2016     Chemistry      Component Value Date/Time   NA 142 10/03/2016 0756   K 3.4 (L) 10/03/2016 0756   CL 108 10/02/2016 0606   CO2 24 10/03/2016 0756   BUN 26.5 (H) 10/03/2016 0756   CREATININE 1.0 10/03/2016 0756      Component Value Date/Time   CALCIUM 8.8 10/03/2016 0756   ALKPHOS 80 10/03/2016 0756   AST 18 10/03/2016 0756   ALT 18 10/03/2016 0756   BILITOT 2.45 (H) 10/03/2016 0756       Impression and Plan:   70 year old gentleman with the following issues:  1. Autoimmune hemolytic anemia presented with hemoglobin of 7.5 and positive Coombs testing. He was diagnosed In 07/30/2017currently on prednisone 70 mg daily.  His hemoglobin has normalized Initially on full dose prednisone. However, he has been tapered off completely and could not tolerate the taper with his hemoglobin is drifting down indicating relapse.  I recommended treatment with rituximab but he declined that option after agreeing initially. He was willing to retry prednisone again first and try a slow taper after that.  He was started on prednisone 90 mg daily on 09/30/2016. His hemoglobin today is back to normal and feels well. The plan is to start a very slow prednisone taper and I instructed him to go down to 80 mg daily and recheck his CBC on a weekly  basis. The plan is to taper his prednisone by 10 mg a week if he can tolerate that.  2. Community acquired pneumonia: Resolved at this time without any respiratory symptoms.  3. Hyperglycemia: Managed by his primary care physician currently on oral hypoglycemic agent.  4. Splenomegaly: No clear-cut evidence of lymphoproliferative disorder detected on previous imaging studies but does have an element of splenomegaly. A bone marrow biopsy will be needed to complete his workup in the near future.  5. Follow-up: Will be weekly to check his CBC as he tapers off prednisone.   [facility], MD 11/16/20178:36 AM

## 2016-10-23 ENCOUNTER — Other Ambulatory Visit (HOSPITAL_BASED_OUTPATIENT_CLINIC_OR_DEPARTMENT_OTHER): Payer: Medicare Other

## 2016-10-23 DIAGNOSIS — D588 Other specified hereditary hemolytic anemias: Secondary | ICD-10-CM

## 2016-10-23 DIAGNOSIS — R161 Splenomegaly, not elsewhere classified: Secondary | ICD-10-CM

## 2016-10-23 LAB — CBC WITH DIFFERENTIAL/PLATELET
BASO%: 0.1 % (ref 0.0–2.0)
Basophils Absolute: 0 10*3/uL (ref 0.0–0.1)
EOS%: 0.8 % (ref 0.0–7.0)
Eosinophils Absolute: 0.1 10*3/uL (ref 0.0–0.5)
HCT: 39.7 % (ref 38.4–49.9)
HGB: 13.8 g/dL (ref 13.0–17.1)
LYMPH%: 38 % (ref 14.0–49.0)
MCH: 33.6 pg — ABNORMAL HIGH (ref 27.2–33.4)
MCHC: 34.8 g/dL (ref 32.0–36.0)
MCV: 96.6 fL (ref 79.3–98.0)
MONO#: 0.4 10*3/uL (ref 0.1–0.9)
MONO%: 4.1 % (ref 0.0–14.0)
NEUT#: 5.2 10*3/uL (ref 1.5–6.5)
NEUT%: 57 % (ref 39.0–75.0)
PLATELETS: 61 10*3/uL — AB (ref 140–400)
RBC: 4.11 10*6/uL — AB (ref 4.20–5.82)
RDW: 15 % — ABNORMAL HIGH (ref 11.0–14.6)
WBC: 9.1 10*3/uL (ref 4.0–10.3)
lymph#: 3.5 10*3/uL — ABNORMAL HIGH (ref 0.9–3.3)

## 2016-10-23 LAB — TECHNOLOGIST REVIEW

## 2016-10-30 ENCOUNTER — Other Ambulatory Visit: Payer: Self-pay | Admitting: *Deleted

## 2016-10-30 ENCOUNTER — Other Ambulatory Visit (HOSPITAL_BASED_OUTPATIENT_CLINIC_OR_DEPARTMENT_OTHER): Payer: Medicare Other

## 2016-10-30 DIAGNOSIS — D588 Other specified hereditary hemolytic anemias: Secondary | ICD-10-CM | POA: Diagnosis present

## 2016-10-30 DIAGNOSIS — R161 Splenomegaly, not elsewhere classified: Secondary | ICD-10-CM

## 2016-10-30 LAB — CBC WITH DIFFERENTIAL/PLATELET
BASO%: 0.8 % (ref 0.0–2.0)
Basophils Absolute: 0.1 10*3/uL (ref 0.0–0.1)
EOS%: 0.6 % (ref 0.0–7.0)
Eosinophils Absolute: 0.1 10*3/uL (ref 0.0–0.5)
HCT: 42.2 % (ref 38.4–49.9)
HGB: 14.3 g/dL (ref 13.0–17.1)
LYMPH%: 38.9 % (ref 14.0–49.0)
MCH: 32.8 pg (ref 27.2–33.4)
MCHC: 33.9 g/dL (ref 32.0–36.0)
MCV: 96.9 fL (ref 79.3–98.0)
MONO#: 0.4 10*3/uL (ref 0.1–0.9)
MONO%: 3.6 % (ref 0.0–14.0)
NEUT#: 5.6 10*3/uL (ref 1.5–6.5)
NEUT%: 56.1 % (ref 39.0–75.0)
PLATELETS: 140 10*3/uL (ref 140–400)
RBC: 4.36 10*6/uL (ref 4.20–5.82)
RDW: 14.5 % (ref 11.0–14.6)
WBC: 9.9 10*3/uL (ref 4.0–10.3)
lymph#: 3.9 10*3/uL — ABNORMAL HIGH (ref 0.9–3.3)

## 2016-10-30 LAB — TECHNOLOGIST REVIEW

## 2016-10-30 MED ORDER — PREDNISONE 10 MG PO TABS: 60.0000 mg | ORAL_TABLET | Freq: Every day | ORAL | 0 refills | Status: DC

## 2016-10-30 NOTE — Telephone Encounter (Signed)
Per Dr. Alen Blew, lets use Prednisone 10 mg tablets, instead of 20 mg, due to tapering will be easier. A prescription was e-scribed for #100. Patient knows to start taking 60 mg on Friday, 11/01/16 for one week. Patient verbalized understanding.

## 2016-11-06 ENCOUNTER — Other Ambulatory Visit (HOSPITAL_BASED_OUTPATIENT_CLINIC_OR_DEPARTMENT_OTHER): Payer: Medicare Other

## 2016-11-06 ENCOUNTER — Telehealth: Payer: Self-pay | Admitting: Oncology

## 2016-11-06 ENCOUNTER — Ambulatory Visit (HOSPITAL_BASED_OUTPATIENT_CLINIC_OR_DEPARTMENT_OTHER): Payer: Medicare Other | Admitting: Oncology

## 2016-11-06 VITALS — BP 121/76 | HR 62 | Temp 97.9°F | Resp 18 | Ht 72.0 in | Wt 196.7 lb

## 2016-11-06 DIAGNOSIS — D599 Acquired hemolytic anemia, unspecified: Secondary | ICD-10-CM | POA: Diagnosis not present

## 2016-11-06 DIAGNOSIS — D588 Other specified hereditary hemolytic anemias: Secondary | ICD-10-CM | POA: Diagnosis present

## 2016-11-06 DIAGNOSIS — R161 Splenomegaly, not elsewhere classified: Secondary | ICD-10-CM | POA: Diagnosis not present

## 2016-11-06 DIAGNOSIS — D72828 Other elevated white blood cell count: Secondary | ICD-10-CM

## 2016-11-06 LAB — CBC WITH DIFFERENTIAL/PLATELET
BASO%: 0.3 % (ref 0.0–2.0)
BASOS ABS: 0 10*3/uL (ref 0.0–0.1)
EOS%: 0.2 % (ref 0.0–7.0)
Eosinophils Absolute: 0 10*3/uL (ref 0.0–0.5)
HEMATOCRIT: 40.2 % (ref 38.4–49.9)
HGB: 13.9 g/dL (ref 13.0–17.1)
LYMPH%: 23.9 % (ref 14.0–49.0)
MCH: 32.4 pg (ref 27.2–33.4)
MCHC: 34.6 g/dL (ref 32.0–36.0)
MCV: 93.7 fL (ref 79.3–98.0)
MONO#: 0.4 10*3/uL (ref 0.1–0.9)
MONO%: 3.4 % (ref 0.0–14.0)
NEUT#: 7.7 10*3/uL — ABNORMAL HIGH (ref 1.5–6.5)
NEUT%: 72.2 % (ref 39.0–75.0)
Platelets: 128 10*3/uL — ABNORMAL LOW (ref 140–400)
RBC: 4.29 10*6/uL (ref 4.20–5.82)
RDW: 14.3 % (ref 11.0–14.6)
WBC: 10.6 10*3/uL — ABNORMAL HIGH (ref 4.0–10.3)
lymph#: 2.5 10*3/uL (ref 0.9–3.3)

## 2016-11-06 LAB — TECHNOLOGIST REVIEW

## 2016-11-06 NOTE — Progress Notes (Signed)
Hematology and Oncology Follow Up Visit  Richard Evans [id number] [birth year] 70 y.o. 11/06/2016 10:25 AM [name] [name], MDVyas, [name], MD   Principle Diagnosis: 70 year old gentleman with autoimmune hemolytic anemia diagnosed on July 30 of 2017. He presented with hemoglobin of 7.5, MCV 106 with elevated reticulocyte count and haptoglobin less than 10. Coombs testing was positive. CT scan of the chest abdomen and pelvis showed nonspecific hilar adenopathy.   Prior Therapy:   Status post packed red cell transfusion for 1 unit on 07/04/2016. Prednisone 70 mg daily started on 06/30/2016. He achieved complete response and prednisone tapered completely off on 09/11/2016. Sequelae had a chronic relapse with worsening anemia.  Current therapy: Prednisone 90 mg daily started on 09/30/2016. He achieved complete response on 10/17/2016. He is currently at tapering doses of 60 mg daily which will be changed to 50 mg on 11/08/2016.  Interim History:  Mr. Richard Evans presents today for a follow-up visit. Since the last visit, he reports no major complaints at this time. He continues to taper prednisone at 10 mg per week without any complications. He denied any fatigue, tiredness or excessive lethargy. He denied any hematochezia or melena. He denied any dyspnea on exertion. He remained active and attends to activities of daily living. He does not report any fevers, chills or sweats. He does not report any constitutional symptoms. His appetite remains excellent and have gained 3 pounds since last visit.   He does not report any headaches, blurry vision, syncope or seizures. He is not report any fevers or chills or weight loss. He does not report any chest pain, palpitation, orthopnea or leg edema. He does not report any cough, wheezing or hemoptysis. He does not report any nausea, vomiting or abdominal pain. He does not report any frequency urgency or hesitancy. He has not reported any skeletal complaints. Remaining  review of systems unremarkable   Medications: I have reviewed the patient's current medications.  Current Outpatient Prescriptions  Medication Sig Dispense Refill  . atenolol (TENORMIN) 25 MG tablet Take 25 mg by mouth at bedtime.     . folic acid (FOLVITE) 1 MG tablet Take 1 tablet (1 mg total) by mouth daily. 30 tablet 1  . glipiZIDE (GLUCOTROL XL) 5 MG 24 hr tablet Take 5 mg by mouth daily before breakfast.     . polyethylene glycol powder (CLEARLAX) powder Take 17 g by mouth daily as needed (constipation).    . predniSONE (DELTASONE) 10 MG tablet Take 6 tablets (60 mg total) by mouth daily with breakfast. 100 tablet 0  . promethazine (PHENERGAN) 25 MG tablet Take 6.25-12.5 mg by mouth 3 (three) times daily as needed for nausea or vomiting. 1/4- 1/2 tablet    . Tamsulosin HCl (FLOMAX) 0.4 MG CAPS Take 0.4 mg by mouth at bedtime.      No current facility-administered medications for this visit.      Allergies:  Allergies  Allergen Reactions  . Adhesive [Tape] Itching and Rash    Paper tape or wrapped bandage is preferred    Past Medical History, Surgical history, Social history, and Family History were reviewed and updated.  Physical Exam: Blood pressure 121/76, pulse 62, temperature 97.9 F (36.6 C), temperature source Oral, resp. rate 18, height 6' (1.829 m), weight 196 lb 11.2 oz (89.2 kg), SpO2 93 %. ECOG: 0 General appearance: Alert, awake gentleman without distress. Head: Normocephalic, without obvious abnormality. No oral thrush noted. Neck: no adenopathy Lymph nodes: Cervical, supraclavicular, and axillary nodes normal.  Heart:regular rate and rhythm, S1, S2 normal, no murmur, click, rub or gallop Lung:chest clear, no wheezing, rales, normal symmetric air entry.  Abdomin: soft, non-tender, without masses or organomegalyI. No shifting dullness or ascites. Tip of the spleen palpated. EXT: No edema or erythema.   Lab Results: Lab Results  Component Value Date   WBC  10.6 (H) 11/06/2016   HGB 13.9 11/06/2016   HCT 40.2 11/06/2016   MCV 93.7 11/06/2016   PLT 128 (L) 11/06/2016     Chemistry      Component Value Date/Time   NA 142 10/03/2016 0756   K 3.4 (L) 10/03/2016 0756   CL 108 10/02/2016 0606   CO2 24 10/03/2016 0756   BUN 26.5 (H) 10/03/2016 0756   CREATININE 1.0 10/03/2016 0756      Component Value Date/Time   CALCIUM 8.8 10/03/2016 0756   ALKPHOS 80 10/03/2016 0756   AST 18 10/03/2016 0756   ALT 18 10/03/2016 0756   BILITOT 2.45 (H) 10/03/2016 0756       Impression and Plan:   70 year old gentleman with the following issues:  1. Autoimmune hemolytic anemia presented with hemoglobin of 7.5 and positive Coombs testing. He was diagnosed In 06/30/2016.  His hemoglobin has normalized Initially on full dose prednisone. However, he has been tapered off completely and could not tolerate the taper with his hemoglobin is drifting down indicating relapse.  I recommended treatment with rituximab but he declined that option after agreeing initially. He was willing to retry prednisone again first and try a slow taper after that.  He was started on prednisone 90 mg daily on 09/30/2016. His hemoglobin today is back to normal and feels well.   The plan is to continue his slow prednisone taper at 10 mg every week while maintaining his complete response. Consideration to add Imuran or possible rituximab at some point will be discussed.  Underlying primary hematological disorder such as lymphoproliferative disorder as a possibility. Myeloproliferative disorder are considered less likely to be associated with hemolysis. He did have findings to suggest myeloproliferative disorder including extra medullary hematopoiesis as well as splenomegaly. Based on these findings, I have recommended a bone marrow biopsy for further investigation. Risks and benefits of this approach were reviewed today and complications such as pain, bleeding, infection among others  were reviewed. He is agreeable to have that done in the near future.  2. Community acquired pneumonia: Resolved at this time without any respiratory symptoms.  3. Hyperglycemia: Managed by his primary care physician currently on oral hypoglycemic agent.  4. Splenomegaly: Could be related to a lymphoproliferative disorder or myeloproliferative disorder. Bone marrow biopsy will help determine that.  5. Follow-up: Will be weekly to check his CBC as he tapers off prednisone.   UEAVWU,JWJXB, MD 12/6/201710:25 AM

## 2016-11-06 NOTE — Telephone Encounter (Signed)
Appointments scheduled per 12/6 LOS. Patient given AVS report and calendars with future scheduled appointments. °

## 2016-11-13 ENCOUNTER — Other Ambulatory Visit (HOSPITAL_BASED_OUTPATIENT_CLINIC_OR_DEPARTMENT_OTHER): Payer: Medicare Other

## 2016-11-13 ENCOUNTER — Other Ambulatory Visit: Payer: Self-pay | Admitting: Radiology

## 2016-11-13 ENCOUNTER — Other Ambulatory Visit: Payer: Self-pay | Admitting: General Surgery

## 2016-11-13 ENCOUNTER — Telehealth: Payer: Self-pay | Admitting: *Deleted

## 2016-11-13 DIAGNOSIS — D588 Other specified hereditary hemolytic anemias: Secondary | ICD-10-CM | POA: Diagnosis present

## 2016-11-13 DIAGNOSIS — R161 Splenomegaly, not elsewhere classified: Secondary | ICD-10-CM

## 2016-11-13 LAB — CBC WITH DIFFERENTIAL/PLATELET
BASO%: 0.3 % (ref 0.0–2.0)
Basophils Absolute: 0 10*3/uL (ref 0.0–0.1)
EOS%: 0.3 % (ref 0.0–7.0)
Eosinophils Absolute: 0 10*3/uL (ref 0.0–0.5)
HCT: 38.8 % (ref 38.4–49.9)
HGB: 13.8 g/dL (ref 13.0–17.1)
LYMPH%: 33.2 % (ref 14.0–49.0)
MCH: 32.5 pg (ref 27.2–33.4)
MCHC: 35.6 g/dL (ref 32.0–36.0)
MCV: 91.5 fL (ref 79.3–98.0)
MONO#: 0.3 10*3/uL (ref 0.1–0.9)
MONO%: 3.5 % (ref 0.0–14.0)
NEUT%: 62.7 % (ref 39.0–75.0)
NEUTROS ABS: 6.1 10*3/uL (ref 1.5–6.5)
PLATELETS: 109 10*3/uL — AB (ref 140–400)
RBC: 4.24 10*6/uL (ref 4.20–5.82)
RDW: 14.2 % (ref 11.0–14.6)
WBC: 9.8 10*3/uL (ref 4.0–10.3)
lymph#: 3.3 10*3/uL (ref 0.9–3.3)

## 2016-11-13 LAB — TECHNOLOGIST REVIEW

## 2016-11-13 NOTE — Telephone Encounter (Signed)
As noted below by Dr. Alen Blew, I left a message informing him of his hemoglobin level. Also, keep prednisone taper as discussed with Dr. Alen Blew. Instructed patient to cal [facility] if he has any questions or concerns.

## 2016-11-13 NOTE — Telephone Encounter (Signed)
-----   Message from Assefa Portela, MD sent at 11/13/2016  9:15 AM EST ----- Please let him know his Hgb is normal. Keep prednisone taper as discussed.

## 2016-11-14 ENCOUNTER — Ambulatory Visit (HOSPITAL_COMMUNITY)
Admission: RE | Admit: 2016-11-14 | Discharge: 2016-11-14 | Disposition: A | Discharge status: Home / Self Care | Payer: Medicare Other | Source: Ambulatory Visit | Attending: Oncology | Admitting: Oncology

## 2016-11-14 ENCOUNTER — Encounter (HOSPITAL_COMMUNITY): Payer: Self-pay

## 2016-11-14 DIAGNOSIS — C9110 Chronic lymphocytic leukemia of B-cell type not having achieved remission: Secondary | ICD-10-CM | POA: Diagnosis not present

## 2016-11-14 DIAGNOSIS — D696 Thrombocytopenia, unspecified: Secondary | ICD-10-CM | POA: Diagnosis not present

## 2016-11-14 DIAGNOSIS — R161 Splenomegaly, not elsewhere classified: Secondary | ICD-10-CM | POA: Insufficient documentation

## 2016-11-14 DIAGNOSIS — I10 Essential (primary) hypertension: Secondary | ICD-10-CM | POA: Insufficient documentation

## 2016-11-14 DIAGNOSIS — Z87891 Personal history of nicotine dependence: Secondary | ICD-10-CM | POA: Diagnosis not present

## 2016-11-14 DIAGNOSIS — D72828 Other elevated white blood cell count: Secondary | ICD-10-CM

## 2016-11-14 DIAGNOSIS — Z79899 Other long term (current) drug therapy: Secondary | ICD-10-CM | POA: Diagnosis not present

## 2016-11-14 DIAGNOSIS — C9190 Lymphoid leukemia, unspecified not having achieved remission: Secondary | ICD-10-CM | POA: Diagnosis not present

## 2016-11-14 DIAGNOSIS — D589 Hereditary hemolytic anemia, unspecified: Secondary | ICD-10-CM | POA: Diagnosis not present

## 2016-11-14 DIAGNOSIS — D599 Acquired hemolytic anemia, unspecified: Secondary | ICD-10-CM | POA: Diagnosis not present

## 2016-11-14 LAB — CBC WITH DIFFERENTIAL/PLATELET
BASOS ABS: 0 10*3/uL (ref 0.0–0.1)
Basophils Relative: 0 %
EOS PCT: 1 %
Eosinophils Absolute: 0.1 10*3/uL (ref 0.0–0.7)
HCT: 40.6 % (ref 39.0–52.0)
Hemoglobin: 14.7 g/dL (ref 13.0–17.0)
Lymphocytes Relative: 53 %
Lymphs Abs: 6.3 10*3/uL — ABNORMAL HIGH (ref 0.7–4.0)
MCH: 33.3 pg (ref 26.0–34.0)
MCHC: 36.2 g/dL — ABNORMAL HIGH (ref 30.0–36.0)
MCV: 92.1 fL (ref 78.0–100.0)
MONO ABS: 0.6 10*3/uL (ref 0.1–1.0)
Monocytes Relative: 5 %
NEUTROS PCT: 41 %
Neutro Abs: 4.9 10*3/uL (ref 1.7–7.7)
PLATELETS: 117 10*3/uL — AB (ref 150–400)
RBC: 4.41 MIL/uL (ref 4.22–5.81)
RDW: 14.1 % (ref 11.5–15.5)
WBC: 11.9 10*3/uL — AB (ref 4.0–10.5)

## 2016-11-14 LAB — BASIC METABOLIC PANEL
Anion gap: 8 (ref 5–15)
BUN: 27 mg/dL — ABNORMAL HIGH (ref 6–20)
CHLORIDE: 106 mmol/L (ref 101–111)
CO2: 26 mmol/L (ref 22–32)
CREATININE: 0.89 mg/dL (ref 0.61–1.24)
Calcium: 9.2 mg/dL (ref 8.9–10.3)
GFR calc non Af Amer: >60 mL/min (ref >60)
Glucose, Bld: 89 mg/dL (ref 65–99)
POTASSIUM: 3.9 mmol/L (ref 3.5–5.1)
SODIUM: 140 mmol/L (ref 135–145)

## 2016-11-14 LAB — PROTIME-INR
INR: 0.94
PROTHROMBIN TIME: 12.6 s (ref 11.4–15.2)

## 2016-11-14 LAB — GLUCOSE, CAPILLARY: GLUCOSE-CAPILLARY: 91 mg/dL (ref 65–99)

## 2016-11-14 LAB — BONE MARROW EXAM

## 2016-11-14 MED ORDER — FENTANYL CITRATE (PF) 100 MCG/2ML IJ SOLN
INTRAMUSCULAR | Status: AC | PRN
  Administered 2016-11-14: 50 ug via INTRAVENOUS

## 2016-11-14 MED ORDER — MIDAZOLAM HCL 2 MG/2ML IJ SOLN
INTRAMUSCULAR | Status: AC | PRN
  Administered 2016-11-14 (×2): 1 mg via INTRAVENOUS

## 2016-11-14 MED ORDER — SODIUM CHLORIDE 0.9 % IV SOLN
INTRAVENOUS | Status: DC
  Administered 2016-11-14: 07:00:00 via INTRAVENOUS

## 2016-11-14 MED ORDER — FENTANYL CITRATE (PF) 100 MCG/2ML IJ SOLN
INTRAMUSCULAR | Status: AC
  Filled 2016-11-14: qty 4

## 2016-11-14 MED ORDER — MIDAZOLAM HCL 2 MG/2ML IJ SOLN
INTRAMUSCULAR | Status: AC
  Filled 2016-11-14: qty 4

## 2016-11-14 NOTE — Discharge Instructions (Addendum)
Moderate Conscious Sedation, Adult, Care After °These instructions provide you with information about caring for yourself after your procedure. Your health care provider may also give you more specific instructions. Your treatment has been planned according to current medical practices, but problems sometimes occur. Call your health care provider if you have any problems or questions after your procedure. °What can I expect after the procedure? °After your procedure, it is common: °· To feel sleepy for several hours. °· To feel clumsy and have poor balance for several hours. °· To have poor judgment for several hours. °· To vomit if you eat too soon. °Follow these instructions at home: °For at least 24 hours after the procedure:  ° °· Do not: °¨ Participate in activities where you could fall or become injured. °¨ Drive. °¨ Use heavy machinery. °¨ Drink alcohol. °¨ Take sleeping pills or medicines that cause drowsiness. °¨ Make important decisions or sign legal documents. °¨ Take care of children on your own. °· Rest. °Eating and drinking  °· Follow the diet recommended by your health care provider. °· If you vomit: °¨ Drink water, juice, or soup when you can drink without vomiting. °¨ Make sure you have little or no nausea before eating solid foods. °General instructions  °· Have a responsible adult stay with you until you are awake and alert. °· Take over-the-counter and prescription medicines only as told by your health care provider. °· If you smoke, do not smoke without supervision. °· Keep all follow-up visits as told by your health care provider. This is important. °Contact a health care provider if: °· You keep feeling nauseous or you keep vomiting. °· You feel light-headed. °· You develop a rash. °· You have a fever. °Get help right away if: °· You have trouble breathing. °This information is not intended to replace advice given to you by your health care provider. Make sure you discuss any questions you  have with your health care provider. °Document Released: 09/08/2013 Document Revised: 04/22/2016 Document Reviewed: 03/09/2016 °Elsevier Interactive Patient Education © 2017 [employer] ° ° °Bone Marrow Aspiration and Bone Marrow Biopsy, Adult, Care After °This sheet gives you information about how to care for yourself after your procedure. Your health care provider may also give you more specific instructions. If you have problems or questions, contact your health care provider. °What can I expect after the procedure? °After the procedure, it is common to have: °· Mild pain and tenderness. °· Swelling. °· Bruising. °Follow these instructions at home: °· Take over-the-counter or prescription medicines only as told by your health care provider. °· Do not take baths, swim, or use a hot tub until your health care provider approves. Ask if you can take a shower or have a sponge bath. °· Follow instructions from your health care provider about how to take care of the puncture site. Make sure you: °¨ Wash your hands with soap and water before you change your bandage (dressing). If soap and water are not available, use hand sanitizer. °¨ Change your dressing as told by your health care provider. °· Check your puncture site every day for signs of infection. Check for: °¨ More redness, swelling, or pain. °¨ More fluid or blood. °¨ Warmth. °¨ Pus or a bad smell. °· Return to your normal activities as told by your health care provider. Ask your health care provider what activities are safe for you. °· Do not drive for 24 hours if you were given a medicine to help   Keep all follow-up visits as told by your health care provider. This is important. °Contact a health care provider if: °· You have more redness, swelling, or pain around the puncture site. °· You have more fluid or blood coming from the puncture site. °· Your puncture site feels warm to the touch. °· You have pus or a bad smell coming from the  puncture site. °· You have a fever. °· Your pain is not controlled with medicine. °This information is not intended to replace advice given to you by your health care provider. Make sure you discuss any questions you have with your health care provider. °Document Released: 06/07/2005 Document Revised: 06/07/2016 Document Reviewed: 05/01/2016 °Elsevier Interactive Patient Education © 2017 [employer] ° °

## 2016-11-14 NOTE — Procedures (Signed)
Interventional Radiology Procedure Note  Procedure: CT guided aspirate and core biopsy of left iliac bone Complications: None Recommendations: - Bedrest supine x 1 hrs - OTC's PRN  Pain - Follow biopsy results  Signed,  Bobi Daudelin S. James Senn, DO      

## 2016-11-14 NOTE — H&P (Signed)
Chief Complaint: Patient was seen in consultation today for bone marrow biopsy  Referring Physician(s):  Dr. Zola Button  Supervising Physician: Corrie Mckusick  Patient Status: [facility] - Out-pt  History of Present Illness: Richard Evans is a 70 y.o. male with history of autoimmune hemolytic anemia diagnosed in July 2017 treated with prednisone.  Despite treatment patient's hemoglobin continues to decline prompting further evaluation of a possible hematological disorder.     CT Abd/pelvis obtained 10/25 showed: 1. Soft tissue mass in the central mesenteries of the small bowel adjacent to the fourth portion the duodenum decreased in size from 06/30/2016. Patient has received steroid therapy for hemolytic anemia in the interval. Differential considerations would include extramedullary hematopoiesis of the mesenteries versus mesenteric panniculitis and less likely lymphoproliferative process. 2. Mild splenomegaly unchanged. Splenomegaly may also relate to extramedullary hematopoiesis. 3. Sigmoid diverticulosis without diverticulitis. 4. Probable benign cysts of the RIGHT kidney  Given CT findings and decline in hemoglobin, IR consulted for bone marrow biopsy at the request of Dr. Zola Button.   Patient is not on any blood thinners. He has been NPO since last evening.   Past Medical History:  Diagnosis Date  . Hemolytic anemia ([place])   . History of epistaxis    hx [facility] epistaxis [facility]  . Hypertension   . Pneumonia   . Vocal cord nodule    benign    Past Surgical History:  Procedure Laterality Date  . COLONOSCOPY  12/96   Dr. Rehman>diverticulosis/ external hemorrhoids  . THROAT SURGERY  1977   nodules removed from larnyx    Allergies: Adhesive [tape]  Medications: Prior to Admission medications   Medication Sig Start Date End Date Taking? Authorizing Provider  diphenhydrAMINE (BENADRYL) 25 mg capsule Take 25 mg by mouth every 6 (six)  hours as needed.   Yes Historical Provider, MD  folic acid (FOLVITE) 1 MG tablet Take 1 tablet (1 mg total) by mouth daily. 10/17/16  Yes Buenger Portela, MD  glipiZIDE (GLUCOTROL XL) 5 MG 24 hr tablet Take 5 mg by mouth daily before breakfast.  09/19/16  Yes Historical Provider, MD  metoprolol succinate (TOPROL-XL) 25 MG 24 hr tablet Take 25 mg by mouth daily. Pharmacy unable to get atenolol, taking this for now   Yes Historical Provider, MD  polyethylene glycol powder (CLEARLAX) powder Take 17 g by mouth daily as needed (constipation).   Yes Historical Provider, MD  predniSONE (DELTASONE) 10 MG tablet Take 6 tablets (60 mg total) by mouth daily with breakfast. 10/30/16  Yes Humphrey Portela, MD  Tamsulosin HCl (FLOMAX) 0.4 MG CAPS Take 0.4 mg by mouth at bedtime.    Yes Historical Provider, MD  atenolol (TENORMIN) 25 MG tablet Take 25 mg by mouth at bedtime.     Historical Provider, MD  promethazine (PHENERGAN) 25 MG tablet Take 6.25-12.5 mg by mouth 3 (three) times daily as needed for nausea or vomiting. 1/4- 1/2 tablet    Historical Provider, MD     Family History  Problem Relation Age of Onset  . Brain cancer Mother   . Heart attack Father   . Brain cancer Brother   . Cancer - Lung Brother   . Stroke Brother     Social History   Social History  . Marital status: Single    Spouse name: N/A  . Number of children: N/A  . Years of education: N/A   Social History Main Topics  . Smoking status:  Former Smoker    Packs/day: 2.00    Years: 50.00    Types: Cigarettes    Quit date: 11/30/2009  . Smokeless tobacco: Never Used  . Alcohol use No  . Drug use: No  . Sexual activity: Yes    Birth control/ protection: None   Other Topics Concern  . None   Social History Narrative  . None    Review of Systems  Constitutional: Negative for activity change, appetite change and fever.  Respiratory: Negative for cough and shortness of breath.   Cardiovascular: Negative for chest pain.    Gastrointestinal: Negative for abdominal pain.  Psychiatric/Behavioral: Negative for behavioral problems and confusion.    Vital Signs: BP 136/83 (BP Location: Right Arm)   Pulse 70   Temp 97.5 F (36.4 C) (Oral)   Resp 18   Ht 6' (1.829 m)   Wt 196 lb 11.2 oz (89.2 kg)   SpO2 97%   BMI 26.68 kg/m   Physical Exam  Constitutional: He is oriented to person, place, and time. He appears well-developed.  HENT:  Head: Normocephalic.  Cardiovascular: Normal rate and regular rhythm.   Pulmonary/Chest: Effort normal and breath sounds normal.  Abdominal: Soft.  Neurological: He is alert and oriented to person, place, and time.  Skin: Skin is warm and dry.  Psychiatric: He has a normal mood and affect. His behavior is normal. Judgment and thought content normal.  Nursing note and vitals reviewed.   Mallampati Score:  MD Evaluation Airway: WNL Heart: WNL Abdomen: WNL Chest/ Lungs: WNL ASA  Classification: 3 Mallampati/Airway Score: Two  Imaging: No results found.  Labs:  CBC:  Recent Labs  10/30/16 0756 11/06/16 0932 11/13/16 0825 11/14/16 0723  WBC 9.9 10.6* 9.8 11.9*  HGB 14.3 13.9 13.8 14.7  HCT 42.2 40.2 38.8 40.6  PLT 140 128* 109* 117*    COAGS:  Recent Labs  06/30/16 1509 07/08/16 1726 09/27/16 1543 11/14/16 0723  INR 1.21 1.15 1.19 0.94  APTT 39*  --  42*  --     BMP:  Recent Labs  09/30/16 0441 10/01/16 0358 10/02/16 0606 10/03/16 0756 11/14/16 0723  NA 140 139 143 142 140  K 3.7 3.5 3.9 3.4* 3.9  CL 110 109 108  --  106  CO2 24 25 28 24 26   GLUCOSE 157* 170* 106* 131 89  BUN 22* 22* 24* 26.5* 27*  CALCIUM 8.4* 8.6* 8.9 8.8 9.2  CREATININE 0.97 0.89 1.04 1.0 0.89  GFRNONAA >60 >60 >60  --  >60  GFRAA >60 >60 >60  --  >60    LIVER FUNCTION TESTS:  Recent Labs  09/29/16 0533 09/30/16 0441 10/02/16 0606 10/03/16 0756  BILITOT 2.3* 2.3* 2.6* 2.45*  AST 25 26 24 18   ALT 18 20 19 18   ALKPHOS 59 63 63 80  PROT 6.1* 5.8*  5.9* 6.3*  ALBUMIN 3.3* 3.3* 3.4* 3.5    TUMOR MARKERS: No results for input(s): AFPTM, CEA, CA199, CHROMGRNA in the last 8760 hours.  Assessment and Plan: Hemolytic Anemia Patient with history of hemolytic anemia with post-treatment relapse presents for bone marrow biopsy at the request of Dr. Alen Blew.  Patient has been scheduled for procedure today.  He has been NPO and does not take any blood thinners.  Risks and benefits discussed with the patient including, but not limited to bleeding, infection, damage to adjacent structures or low yield requiring additional tests. All of the patient's questions were answered, patient is agreeable to proceed.  Consent signed and in chart.   Thank you for this interesting consult.  I greatly enjoyed meeting Richard Evans and look forward to participating in their care.  A copy of this report was sent to the requesting provider on this date.  Electronically Signed: Docia Barrier 11/14/2016, 8:36 AM   I spent a total of  30 Minutes   in face to face in clinical consultation, greater than 50% of which was counseling/coordinating care for hemolytic anemia.

## 2016-11-14 NOTE — Sedation Documentation (Signed)
Patient is resting comfortably. 

## 2016-11-20 ENCOUNTER — Other Ambulatory Visit (HOSPITAL_BASED_OUTPATIENT_CLINIC_OR_DEPARTMENT_OTHER): Payer: Medicare Other

## 2016-11-20 ENCOUNTER — Encounter: Payer: Self-pay | Admitting: *Deleted

## 2016-11-20 DIAGNOSIS — R161 Splenomegaly, not elsewhere classified: Secondary | ICD-10-CM

## 2016-11-20 DIAGNOSIS — D588 Other specified hereditary hemolytic anemias: Secondary | ICD-10-CM

## 2016-11-20 DIAGNOSIS — D599 Acquired hemolytic anemia, unspecified: Secondary | ICD-10-CM

## 2016-11-20 DIAGNOSIS — D72828 Other elevated white blood cell count: Secondary | ICD-10-CM

## 2016-11-20 LAB — COMPREHENSIVE METABOLIC PANEL
ALT: 39 U/L (ref 0–55)
ANION GAP: 9 meq/L (ref 3–11)
AST: 19 U/L (ref 5–34)
Albumin: 3.7 g/dL (ref 3.5–5.0)
Alkaline Phosphatase: 49 U/L (ref 40–150)
BILIRUBIN TOTAL: 1.41 mg/dL — AB (ref 0.20–1.20)
BUN: 25.8 mg/dL (ref 7.0–26.0)
CALCIUM: 9.3 mg/dL (ref 8.4–10.4)
CO2: 26 meq/L (ref 22–29)
CREATININE: 1.1 mg/dL (ref 0.7–1.3)
Chloride: 106 mEq/L (ref 98–109)
EGFR: 71 mL/min/{1.73_m2} — ABNORMAL LOW (ref >90)
Glucose: 225 mg/dl — ABNORMAL HIGH (ref 70–140)
Potassium: 4 mEq/L (ref 3.5–5.1)
Sodium: 141 mEq/L (ref 136–145)
TOTAL PROTEIN: 6.4 g/dL (ref 6.4–8.3)

## 2016-11-20 LAB — CBC WITH DIFFERENTIAL/PLATELET
BASO%: 0.5 % (ref 0.0–2.0)
Basophils Absolute: 0.1 10*3/uL (ref 0.0–0.1)
EOS%: 0.7 % (ref 0.0–7.0)
Eosinophils Absolute: 0.1 10*3/uL (ref 0.0–0.5)
HEMATOCRIT: 40.6 % (ref 38.4–49.9)
HEMOGLOBIN: 13.9 g/dL (ref 13.0–17.1)
LYMPH#: 4.1 10*3/uL — AB (ref 0.9–3.3)
LYMPH%: 39.3 % (ref 14.0–49.0)
MCH: 32.1 pg (ref 27.2–33.4)
MCHC: 34.3 g/dL (ref 32.0–36.0)
MCV: 93.5 fL (ref 79.3–98.0)
MONO#: 0.4 10*3/uL (ref 0.1–0.9)
MONO%: 3.6 % (ref 0.0–14.0)
NEUT%: 55.9 % (ref 39.0–75.0)
NEUTROS ABS: 5.9 10*3/uL (ref 1.5–6.5)
PLATELETS: 115 10*3/uL — AB (ref 140–400)
RBC: 4.34 10*6/uL (ref 4.20–5.82)
RDW: 14.7 % — AB (ref 11.0–14.6)
WBC: 10.5 10*3/uL — AB (ref 4.0–10.3)

## 2016-11-20 LAB — TECHNOLOGIST REVIEW

## 2016-11-21 LAB — TISSUE HYBRIDIZATION (BONE MARROW)-NCBH

## 2016-11-21 LAB — CHROMOSOME ANALYSIS, BONE MARROW

## 2016-11-27 ENCOUNTER — Ambulatory Visit (HOSPITAL_BASED_OUTPATIENT_CLINIC_OR_DEPARTMENT_OTHER): Payer: Medicare Other | Admitting: Oncology

## 2016-11-27 ENCOUNTER — Other Ambulatory Visit (HOSPITAL_BASED_OUTPATIENT_CLINIC_OR_DEPARTMENT_OTHER): Payer: Medicare Other

## 2016-11-27 ENCOUNTER — Telehealth: Payer: Self-pay | Admitting: Oncology

## 2016-11-27 ENCOUNTER — Other Ambulatory Visit: Payer: Medicare Other

## 2016-11-27 VITALS — BP 120/44 | HR 73 | Temp 98.0°F | Resp 16 | Ht 72.0 in | Wt 204.4 lb

## 2016-11-27 DIAGNOSIS — D599 Acquired hemolytic anemia, unspecified: Secondary | ICD-10-CM

## 2016-11-27 DIAGNOSIS — C9110 Chronic lymphocytic leukemia of B-cell type not having achieved remission: Secondary | ICD-10-CM | POA: Diagnosis not present

## 2016-11-27 DIAGNOSIS — R161 Splenomegaly, not elsewhere classified: Secondary | ICD-10-CM

## 2016-11-27 DIAGNOSIS — D588 Other specified hereditary hemolytic anemias: Secondary | ICD-10-CM

## 2016-11-27 LAB — CBC WITH DIFFERENTIAL/PLATELET
BASO%: 0.6 % (ref 0.0–2.0)
Basophils Absolute: 0.1 10*3/uL (ref 0.0–0.1)
EOS ABS: 0 10*3/uL (ref 0.0–0.5)
EOS%: 0.1 % (ref 0.0–7.0)
HCT: 42.8 % (ref 38.4–49.9)
HEMOGLOBIN: 14.8 g/dL (ref 13.0–17.1)
LYMPH%: 14.6 % (ref 14.0–49.0)
MCH: 32.7 pg (ref 27.2–33.4)
MCHC: 34.5 g/dL (ref 32.0–36.0)
MCV: 94.8 fL (ref 79.3–98.0)
MONO#: 0.2 10*3/uL (ref 0.1–0.9)
MONO%: 2.1 % (ref 0.0–14.0)
NEUT%: 82.6 % — ABNORMAL HIGH (ref 39.0–75.0)
NEUTROS ABS: 8.7 10*3/uL — AB (ref 1.5–6.5)
PLATELETS: 139 10*3/uL — AB (ref 140–400)
RBC: 4.52 10*6/uL (ref 4.20–5.82)
RDW: 14.7 % — AB (ref 11.0–14.6)
WBC: 10.6 10*3/uL — AB (ref 4.0–10.3)
lymph#: 1.5 10*3/uL (ref 0.9–3.3)

## 2016-11-27 LAB — TECHNOLOGIST REVIEW

## 2016-11-27 NOTE — Telephone Encounter (Signed)
Labs scheduled weekly for the month of February 2018, per 11/27/16 los. Lab and follow up appointment scheduled for 01/01/17, per 11/27/16 los. Patient was given a copy of the AVS report and appointment schedule, per 11/27/16 los.

## 2016-11-27 NOTE — Progress Notes (Signed)
Hematology and Oncology Follow Up Visit  Richard Evans [id number] 1946-10-29 70 y.o. 11/27/2016 2:55 PM Richard Evans, MDVyas, [name], MD   Principle Diagnosis: 70 year old gentleman with autoimmune hemolytic anemia diagnosed on July 30 of 2017. He presented with hemoglobin of 7.5, MCV 106 with elevated reticulocyte count and haptoglobin less than 10. Coombs testing was positive. CT scan of the chest abdomen and pelvis showed nonspecific hilar adenopathy. Bone marrow biopsy obtained on 11/14/2016 confirmed the diagnosis of CLL.   Prior Therapy:   Status post packed red cell transfusion for 1 unit on 07/04/2016. Prednisone 70 mg daily started on 06/30/2016. He achieved complete response and prednisone tapered completely off on 09/11/2016. He relapsed quickly with worsening anemia.  Current therapy:  Prednisone 90 mg daily started on 09/30/2016. He achieved complete response on 10/17/2016. He is currently at tapering doses of 30 mg daily which will be changed to 20 mg on 11/29/2016.  Interim History:  Richard Evans presents today for a follow-up visit. Since the last visit, he reports feeling well without any recent complaints. He is appetite remains excellent and have gained more weight. He continues to taper prednisone at 10 mg per week without any complications. He denied any fatigue, tiredness or excessive lethargy. He denied any hematochezia or melena.  His performance status remained excellent and continues to attend activities of daily living. He reports no fevers, chills or dyspnea on exertion. He denied any pulmonary symptoms.   He does not report any headaches, blurry vision, syncope or seizures. He is not report any fevers or chills or weight loss. He does not report any chest pain, palpitation, orthopnea or leg edema. He does not report any cough, wheezing or hemoptysis. He does not report any nausea, vomiting or abdominal pain. He does not report any frequency urgency or hesitancy. He has  not reported any skeletal complaints. Remaining review of systems unremarkable   Medications: I have reviewed the patient's current medications.  Current Outpatient Prescriptions  Medication Sig Dispense Refill  . atenolol (TENORMIN) 25 MG tablet Take 25 mg by mouth at bedtime.     . diphenhydrAMINE (BENADRYL) 25 mg capsule Take 25 mg by mouth every 6 (six) hours as needed.    . folic acid (FOLVITE) 1 MG tablet Take 1 tablet (1 mg total) by mouth daily. 30 tablet 1  . glipiZIDE (GLUCOTROL XL) 5 MG 24 hr tablet Take 5 mg by mouth daily before breakfast.     . metoprolol succinate (TOPROL-XL) 25 MG 24 hr tablet Take 25 mg by mouth daily. Pharmacy unable to get atenolol, taking this for now    . polyethylene glycol powder (CLEARLAX) powder Take 17 g by mouth daily as needed (constipation).    . predniSONE (DELTASONE) 10 MG tablet Take 6 tablets (60 mg total) by mouth daily with breakfast. 100 tablet 0  . promethazine (PHENERGAN) 25 MG tablet Take 6.25-12.5 mg by mouth 3 (three) times daily as needed for nausea or vomiting. 1/4- 1/2 tablet    . Tamsulosin HCl (FLOMAX) 0.4 MG CAPS Take 0.4 mg by mouth at bedtime.      No current facility-administered medications for this visit.      Allergies:  Allergies  Allergen Reactions  . Adhesive [Tape] Itching and Rash    Paper tape or wrapped bandage is preferred    Past Medical History, Surgical history, Social history, and Family History were reviewed and updated.  Physical Exam: Blood pressure (!) 120/44, pulse 73, temperature 98  F (36.7 C), temperature source Oral, resp. rate 16, height 6' (1.829 m), weight 204 lb 6.4 oz (92.7 kg), SpO2 97 %. ECOG: 0 General appearance: Well-appearing gentleman without distress. Head: Normocephalic, without obvious abnormality. No oral ulcers or thrush. Neck: no adenopathy Lymph nodes: Cervical, supraclavicular, and axillary nodes normal. Heart:regular rate and rhythm, S1, S2 normal, no murmur, click, rub  or gallop Lung:chest clear, no wheezing, rales, normal symmetric air entry.  Abdomin: soft, non-tender, without masses or organomegalyI. No rebound or guarding. Mild splenomegaly noted. EXT: No edema or erythema.   Lab Results: Lab Results  Component Value Date   WBC 10.6 (H) 11/27/2016   HGB 14.8 11/27/2016   HCT 42.8 11/27/2016   MCV 94.8 11/27/2016   PLT 139 (L) 11/27/2016     Chemistry      Component Value Date/Time   NA 141 11/20/2016 0846   K 4.0 11/20/2016 0846   CL 106 11/14/2016 0723   CO2 26 11/20/2016 0846   BUN 25.8 11/20/2016 0846   CREATININE 1.1 11/20/2016 0846      Component Value Date/Time   CALCIUM 9.3 11/20/2016 0846   ALKPHOS 49 11/20/2016 0846   AST 19 11/20/2016 0846   ALT 39 11/20/2016 0846   BILITOT 1.41 (H) 11/20/2016 0846     Bone Marrow Flow Cytometry - CHRONIC LYMPHOCYTIC LEUKEMIA/SMALL LYMPHOCYTIC LYMPHOMA, SEE COMMENT. Diagnosis Comment: There is a monoclonal population of B-cells with expression of CD5 and CD23. These findings are consistent with involvement by chronic lymphocytic leukemia/small lymphocytic lymphoma. Richard Males MD Pathologist, Electronic Signature (Case signed 11/18/2016) GROSS AND MICROSCOPIC INFORMATION  Impression and Plan:   70 year old gentleman with the following issues:  1. Autoimmune hemolytic anemia presented with hemoglobin of 7.5 and positive Coombs testing. He was diagnosed In 06/30/2016.  His hemoglobin has normalized Initially on full dose prednisone. However, he has been tapered off completely and could not tolerate the taper with his hemoglobin is drifting down indicating relapse.  I recommended treatment with rituximab but he declined that option after agreeing initially.   He was started on prednisone 90 mg daily on 09/30/2016. His hemoglobin today is back to normal and feels well.   The plan is to continue his slow prednisone taper at 10 mg every week while maintaining his complete response.  Consideration for rituximab as well as Imuran were discussed today if he relapses quickly after he completes steroid taper.  2. CLL: Diagnosed in December 2014. It is likely the etiology of his autoimmune hemolytic anemia. He presented with very little to no adenopathy and mild splenomegaly.  The natural course of this disease was reviewed today with the patient and his family. Indication for treatment were discussed today which include autoimmune hemolysis, symptomatic lymphadenopathy or recurrent infections. Treatment options would include rituximab alone versus rituximab with chemotherapy. At this time, he is reluctant to consider any systemic therapy and I feel it is reasonable to continued observation and surveillance. If his autoimmune hemolysis continue to be controlled, we will continue with active surveillance at this time.  3. Hyperglycemia: Managed by his primary care physician currently on oral hypoglycemic agent.  4. Splenomegaly: Related to CLL and has been asymptomatic..  5. Follow-up: Will be weekly to check his CBC as he tapers off prednisone. Clinical visit will be on 01/01/2017.    LGXQJJ,HERDE, MD 12/27/20172:55 PM

## 2016-11-28 ENCOUNTER — Other Ambulatory Visit: Payer: Self-pay | Admitting: Oncology

## 2016-11-29 ENCOUNTER — Telehealth: Payer: Self-pay | Admitting: *Deleted

## 2016-12-04 ENCOUNTER — Other Ambulatory Visit (HOSPITAL_BASED_OUTPATIENT_CLINIC_OR_DEPARTMENT_OTHER): Payer: Medicare Other

## 2016-12-04 DIAGNOSIS — C9110 Chronic lymphocytic leukemia of B-cell type not having achieved remission: Secondary | ICD-10-CM | POA: Diagnosis not present

## 2016-12-04 DIAGNOSIS — D599 Acquired hemolytic anemia, unspecified: Secondary | ICD-10-CM | POA: Diagnosis present

## 2016-12-04 DIAGNOSIS — R1032 Left lower quadrant pain: Secondary | ICD-10-CM | POA: Diagnosis not present

## 2016-12-04 DIAGNOSIS — Z87891 Personal history of nicotine dependence: Secondary | ICD-10-CM | POA: Diagnosis not present

## 2016-12-04 DIAGNOSIS — Z299 Encounter for prophylactic measures, unspecified: Secondary | ICD-10-CM | POA: Diagnosis not present

## 2016-12-04 DIAGNOSIS — J069 Acute upper respiratory infection, unspecified: Secondary | ICD-10-CM | POA: Diagnosis not present

## 2016-12-04 LAB — CBC WITH DIFFERENTIAL/PLATELET
BASO%: 0.2 % (ref 0.0–2.0)
Basophils Absolute: 0 10*3/uL (ref 0.0–0.1)
EOS ABS: 0.1 10*3/uL (ref 0.0–0.5)
EOS%: 0.4 % (ref 0.0–7.0)
HCT: 38.9 % (ref 38.4–49.9)
HGB: 13.7 g/dL (ref 13.0–17.1)
LYMPH%: 15.8 % (ref 14.0–49.0)
MCH: 32.3 pg (ref 27.2–33.4)
MCHC: 35.2 g/dL (ref 32.0–36.0)
MCV: 91.7 fL (ref 79.3–98.0)
MONO#: 0.7 10*3/uL (ref 0.1–0.9)
MONO%: 5.9 % (ref 0.0–14.0)
NEUT%: 77.7 % — ABNORMAL HIGH (ref 39.0–75.0)
NEUTROS ABS: 8.9 10*3/uL — AB (ref 1.5–6.5)
Platelets: 110 10*3/uL — ABNORMAL LOW (ref 140–400)
RBC: 4.24 10*6/uL (ref 4.20–5.82)
RDW: 14.8 % — ABNORMAL HIGH (ref 11.0–14.6)
WBC: 11.4 10*3/uL — AB (ref 4.0–10.3)
lymph#: 1.8 10*3/uL (ref 0.9–3.3)

## 2016-12-09 ENCOUNTER — Encounter (HOSPITAL_COMMUNITY): Payer: Self-pay

## 2016-12-10 ENCOUNTER — Other Ambulatory Visit: Payer: Self-pay | Admitting: Oncology

## 2016-12-11 ENCOUNTER — Other Ambulatory Visit (HOSPITAL_BASED_OUTPATIENT_CLINIC_OR_DEPARTMENT_OTHER): Payer: Medicare Other

## 2016-12-11 DIAGNOSIS — D599 Acquired hemolytic anemia, unspecified: Secondary | ICD-10-CM

## 2016-12-11 LAB — CBC WITH DIFFERENTIAL/PLATELET
BASO%: 0.6 % (ref 0.0–2.0)
BASOS ABS: 0.1 10*3/uL (ref 0.0–0.1)
EOS ABS: 0.1 10*3/uL (ref 0.0–0.5)
EOS%: 1 % (ref 0.0–7.0)
HCT: 36.6 % — ABNORMAL LOW (ref 38.4–49.9)
HGB: 12.8 g/dL — ABNORMAL LOW (ref 13.0–17.1)
LYMPH%: 31.6 % (ref 14.0–49.0)
MCH: 32 pg (ref 27.2–33.4)
MCHC: 35.1 g/dL (ref 32.0–36.0)
MCV: 91.3 fL (ref 79.3–98.0)
MONO#: 0.8 10*3/uL (ref 0.1–0.9)
MONO%: 7.7 % (ref 0.0–14.0)
NEUT#: 5.9 10*3/uL (ref 1.5–6.5)
NEUT%: 59.1 % (ref 39.0–75.0)
PLATELETS: 177 10*3/uL (ref 140–400)
RBC: 4.01 10*6/uL — AB (ref 4.20–5.82)
RDW: 13.9 % (ref 11.0–14.6)
WBC: 9.9 10*3/uL (ref 4.0–10.3)
lymph#: 3.1 10*3/uL (ref 0.9–3.3)

## 2016-12-13 DIAGNOSIS — E1165 Type 2 diabetes mellitus with hyperglycemia: Secondary | ICD-10-CM | POA: Diagnosis not present

## 2016-12-13 DIAGNOSIS — Z1211 Encounter for screening for malignant neoplasm of colon: Secondary | ICD-10-CM | POA: Diagnosis not present

## 2016-12-13 DIAGNOSIS — Z299 Encounter for prophylactic measures, unspecified: Secondary | ICD-10-CM | POA: Diagnosis not present

## 2016-12-13 DIAGNOSIS — I10 Essential (primary) hypertension: Secondary | ICD-10-CM | POA: Diagnosis not present

## 2016-12-13 DIAGNOSIS — C9110 Chronic lymphocytic leukemia of B-cell type not having achieved remission: Secondary | ICD-10-CM | POA: Diagnosis not present

## 2016-12-13 DIAGNOSIS — Z0000 Encounter for general adult medical examination without abnormal findings: Secondary | ICD-10-CM | POA: Diagnosis not present

## 2016-12-13 DIAGNOSIS — Z79899 Other long term (current) drug therapy: Secondary | ICD-10-CM | POA: Diagnosis not present

## 2016-12-13 DIAGNOSIS — Z1389 Encounter for screening for other disorder: Secondary | ICD-10-CM | POA: Diagnosis not present

## 2016-12-13 DIAGNOSIS — Z6826 Body mass index (BMI) 26.0-26.9, adult: Secondary | ICD-10-CM | POA: Diagnosis not present

## 2016-12-13 DIAGNOSIS — R5383 Other fatigue: Secondary | ICD-10-CM | POA: Diagnosis not present

## 2016-12-13 DIAGNOSIS — Z125 Encounter for screening for malignant neoplasm of prostate: Secondary | ICD-10-CM | POA: Diagnosis not present

## 2016-12-16 DIAGNOSIS — Z125 Encounter for screening for malignant neoplasm of prostate: Secondary | ICD-10-CM | POA: Diagnosis not present

## 2016-12-16 DIAGNOSIS — R5383 Other fatigue: Secondary | ICD-10-CM | POA: Diagnosis not present

## 2016-12-16 DIAGNOSIS — Z79899 Other long term (current) drug therapy: Secondary | ICD-10-CM | POA: Diagnosis not present

## 2016-12-18 ENCOUNTER — Other Ambulatory Visit: Payer: Medicare Other

## 2016-12-24 ENCOUNTER — Other Ambulatory Visit: Payer: Self-pay | Admitting: *Deleted

## 2016-12-24 DIAGNOSIS — D72829 Elevated white blood cell count, unspecified: Secondary | ICD-10-CM

## 2016-12-25 ENCOUNTER — Other Ambulatory Visit (HOSPITAL_BASED_OUTPATIENT_CLINIC_OR_DEPARTMENT_OTHER): Payer: Medicare Other

## 2016-12-25 DIAGNOSIS — D599 Acquired hemolytic anemia, unspecified: Secondary | ICD-10-CM

## 2016-12-25 DIAGNOSIS — D72829 Elevated white blood cell count, unspecified: Secondary | ICD-10-CM

## 2016-12-25 LAB — CBC WITH DIFFERENTIAL/PLATELET
BASO%: 0.3 % (ref 0.0–2.0)
Basophils Absolute: 0 10*3/uL (ref 0.0–0.1)
EOS%: 1.6 % (ref 0.0–7.0)
Eosinophils Absolute: 0.1 10*3/uL (ref 0.0–0.5)
HEMATOCRIT: 35 % — AB (ref 38.4–49.9)
HGB: 12.2 g/dL — ABNORMAL LOW (ref 13.0–17.1)
LYMPH#: 2.6 10*3/uL (ref 0.9–3.3)
LYMPH%: 34.7 % (ref 14.0–49.0)
MCH: 31.3 pg (ref 27.2–33.4)
MCHC: 34.9 g/dL (ref 32.0–36.0)
MCV: 89.7 fL (ref 79.3–98.0)
MONO#: 1 10*3/uL — AB (ref 0.1–0.9)
MONO%: 13 % (ref 0.0–14.0)
NEUT%: 50.4 % (ref 39.0–75.0)
NEUTROS ABS: 3.8 10*3/uL (ref 1.5–6.5)
PLATELETS: 130 10*3/uL — AB (ref 140–400)
RBC: 3.9 10*6/uL — ABNORMAL LOW (ref 4.20–5.82)
RDW: 14.4 % (ref 11.0–14.6)
WBC: 7.5 10*3/uL (ref 4.0–10.3)

## 2016-12-25 LAB — COMPREHENSIVE METABOLIC PANEL
ALBUMIN: 3.8 g/dL (ref 3.5–5.0)
ALT: 22 U/L (ref 0–55)
ANION GAP: 9 meq/L (ref 3–11)
AST: 20 U/L (ref 5–34)
Alkaline Phosphatase: 71 U/L (ref 40–150)
BILIRUBIN TOTAL: 1.16 mg/dL (ref 0.20–1.20)
BUN: 16.7 mg/dL (ref 7.0–26.0)
CALCIUM: 9.6 mg/dL (ref 8.4–10.4)
CO2: 25 mEq/L (ref 22–29)
CREATININE: 1.2 mg/dL (ref 0.7–1.3)
Chloride: 110 mEq/L — ABNORMAL HIGH (ref 98–109)
EGFR: 59 mL/min/{1.73_m2} — ABNORMAL LOW (ref >90)
Glucose: 182 mg/dl — ABNORMAL HIGH (ref 70–140)
Potassium: 4.3 mEq/L (ref 3.5–5.1)
Sodium: 144 mEq/L (ref 136–145)
TOTAL PROTEIN: 6.6 g/dL (ref 6.4–8.3)

## 2016-12-25 LAB — TECHNOLOGIST REVIEW

## 2016-12-26 ENCOUNTER — Ambulatory Visit (HOSPITAL_COMMUNITY)
Admission: RE | Admit: 2016-12-26 | Discharge: 2016-12-26 | Disposition: A | Discharge status: Home / Self Care | Payer: Medicare Other | Source: Ambulatory Visit | Attending: Pulmonary Disease | Admitting: Pulmonary Disease

## 2016-12-26 DIAGNOSIS — R590 Localized enlarged lymph nodes: Secondary | ICD-10-CM | POA: Diagnosis not present

## 2016-12-26 DIAGNOSIS — J439 Emphysema, unspecified: Secondary | ICD-10-CM | POA: Insufficient documentation

## 2016-12-26 DIAGNOSIS — I2510 Atherosclerotic heart disease of native coronary artery without angina pectoris: Secondary | ICD-10-CM | POA: Insufficient documentation

## 2016-12-26 DIAGNOSIS — I700 Atherosclerosis of aorta: Secondary | ICD-10-CM | POA: Insufficient documentation

## 2016-12-26 DIAGNOSIS — J189 Pneumonia, unspecified organism: Secondary | ICD-10-CM | POA: Diagnosis not present

## 2016-12-30 ENCOUNTER — Ambulatory Visit (INDEPENDENT_AMBULATORY_CARE_PROVIDER_SITE_OTHER): Payer: Medicare Other | Admitting: Pulmonary Disease

## 2016-12-30 ENCOUNTER — Encounter: Payer: Self-pay | Admitting: Pulmonary Disease

## 2016-12-30 VITALS — BP 118/72 | HR 74 | Ht 72.0 in | Wt 210.0 lb

## 2016-12-30 DIAGNOSIS — J439 Emphysema, unspecified: Secondary | ICD-10-CM

## 2016-12-30 DIAGNOSIS — J181 Lobar pneumonia, unspecified organism: Secondary | ICD-10-CM

## 2016-12-30 DIAGNOSIS — F17200 Nicotine dependence, unspecified, uncomplicated: Secondary | ICD-10-CM

## 2016-12-30 DIAGNOSIS — R0600 Dyspnea, unspecified: Secondary | ICD-10-CM

## 2016-12-30 DIAGNOSIS — Z23 Encounter for immunization: Secondary | ICD-10-CM | POA: Diagnosis not present

## 2016-12-30 DIAGNOSIS — J189 Pneumonia, unspecified organism: Secondary | ICD-10-CM

## 2016-12-30 NOTE — Patient Instructions (Signed)
Lung function is good-you do not need breathing medications  Call me for any breathing related issues

## 2016-12-30 NOTE — Addendum Note (Signed)
Addended by: Valerie Salts on: 12/30/2016 11:16 AM   Modules accepted: Orders

## 2016-12-30 NOTE — Progress Notes (Signed)
   Subjective:    Patient ID: Richard Evans, male    DOB: 07/07/1946, 71 y.o.   MRN: [id number]  HPI  71 year old ex heavy smoker for FU of hemoptysis and abnormal CT imaging.  He was diagnosed with autoimmune hemolytic anemia in 06/2016 and sees Dr. Alen Blew and received a course of high-dose steroids. CT imaging then showed mediastinal lymphadenopathy and splenomegaly     He smoked about a pack per day until 2010 when he quit-about 50-pack-years.  12/30/2016  Chief Complaint  Patient presents with  . Follow-up    3 month follow up. Breathing has a lot better since last visit. Had a URI recently but it cleared up after taking Levaquil.    He needed another round of Levaquin for headaches and chest congestion He was admitted for severe anemia requiring blood transfusion He has now been tapered off prednisone for a couple of weeks and feels vague symptoms with numbness in his legs and occasional headaches-which have been attributed to prednisone taper, he was on high-dose of 90 mg at one point. A diagnosis of CLL was made after bone marrow biopsy-therapy has been deferred for now and he is being observed Hemoptysis has not recurred  We reviewed his CT scan from last week and compared it to last 2 scans that he had   Spirometry shows no evidence of airway obstruction with FEV1 of 85% and FVC of 81%  Significant tests/ events  CT imaging 09/2016 showed underlying centrilobular emphysema and right upper lobe airspace disease which was new when compared to his CT from 06/2016 .  Review of Systems neg for any significant sore throat, dysphagia, itching, sneezing, nasal congestion or excess/ purulent secretions, fever, chills, sweats, unintended wt loss, pleuritic or exertional cp, hempoptysis, orthopnea pnd or change in chronic leg swelling. Also denies presyncope, palpitations, heartburn, abdominal pain, nausea, vomiting, diarrhea or change in bowel or urinary habits, dysuria,hematuria,  rash, arthralgias, visual complaints, headache, numbness weakness or ataxia.     Objective:   Physical Exam   Gen. Pleasant, well-nourished, in no distress ENT - no lesions, no post nasal drip Neck: No JVD, no thyromegaly, no carotid bruits Lungs: no use of accessory muscles, no dullness to percussion, clear without rales or rhonchi  Cardiovascular: Rhythm regular, heart sounds  normal, no murmurs or gallops, no peripheral edema Musculoskeletal: No deformities, no cyanosis or clubbing         Assessment & Plan:

## 2016-12-30 NOTE — Assessment & Plan Note (Signed)
Lung function is good Suggest Prevnar-I was able to convince him to take this

## 2016-12-30 NOTE — Assessment & Plan Note (Signed)
Resolved on repeat imaging No follow-up required

## 2017-01-01 ENCOUNTER — Ambulatory Visit (HOSPITAL_BASED_OUTPATIENT_CLINIC_OR_DEPARTMENT_OTHER): Payer: Medicare Other | Admitting: Oncology

## 2017-01-01 ENCOUNTER — Other Ambulatory Visit (HOSPITAL_BASED_OUTPATIENT_CLINIC_OR_DEPARTMENT_OTHER): Payer: Medicare Other

## 2017-01-01 ENCOUNTER — Telehealth: Payer: Self-pay | Admitting: Oncology

## 2017-01-01 VITALS — BP 136/64 | HR 75 | Temp 97.9°F | Resp 16 | Ht 72.0 in | Wt 204.1 lb

## 2017-01-01 DIAGNOSIS — D599 Acquired hemolytic anemia, unspecified: Secondary | ICD-10-CM

## 2017-01-01 DIAGNOSIS — R161 Splenomegaly, not elsewhere classified: Secondary | ICD-10-CM

## 2017-01-01 DIAGNOSIS — C9110 Chronic lymphocytic leukemia of B-cell type not having achieved remission: Secondary | ICD-10-CM

## 2017-01-01 LAB — CBC WITH DIFFERENTIAL/PLATELET
BASO%: 0.4 % (ref 0.0–2.0)
Basophils Absolute: 0 10*3/uL (ref 0.0–0.1)
EOS%: 2.6 % (ref 0.0–7.0)
Eosinophils Absolute: 0.2 10*3/uL (ref 0.0–0.5)
HCT: 37.2 % — ABNORMAL LOW (ref 38.4–49.9)
HGB: 12.9 g/dL — ABNORMAL LOW (ref 13.0–17.1)
LYMPH%: 35.5 % (ref 14.0–49.0)
MCH: 31 pg (ref 27.2–33.4)
MCHC: 34.7 g/dL (ref 32.0–36.0)
MCV: 89.4 fL (ref 79.3–98.0)
MONO#: 1.1 10*3/uL — AB (ref 0.1–0.9)
MONO%: 12.8 % (ref 0.0–14.0)
NEUT#: 4.2 10*3/uL (ref 1.5–6.5)
NEUT%: 48.7 % (ref 39.0–75.0)
PLATELETS: 129 10*3/uL — AB (ref 140–400)
RBC: 4.16 10*6/uL — AB (ref 4.20–5.82)
RDW: 14.5 % (ref 11.0–14.6)
WBC: 8.5 10*3/uL (ref 4.0–10.3)
lymph#: 3 10*3/uL (ref 0.9–3.3)

## 2017-01-01 LAB — TECHNOLOGIST REVIEW

## 2017-01-01 MED ORDER — FOLIC ACID 1 MG PO TABS: 1.0000 mg | ORAL_TABLET | Freq: Every day | ORAL | 1 refills | Status: DC

## 2017-01-01 NOTE — Progress Notes (Signed)
Hematology and Oncology Follow Up Visit  Richard Evans [id number] 19-Apr-1946 71 y.o. 01/01/2017 10:32 AM [name] [name], MDVyas, [name], MD   Principle Diagnosis: 71 year old gentleman with autoimmune hemolytic anemia diagnosed on July 30 of 2017. He presented with hemoglobin of 7.5, MCV 106 with elevated reticulocyte count and haptoglobin less than 10. Coombs testing was positive. CT scan of the chest abdomen and pelvis showed nonspecific hilar adenopathy. Bone marrow biopsy obtained on 11/14/2016 confirmed the diagnosis of CLL.   Prior Therapy:   Status post packed red cell transfusion for 1 unit on 07/04/2016. Prednisone 70 mg daily started on 06/30/2016. He achieved complete response and prednisone tapered completely off on 09/11/2016. He relapsed quickly with worsening anemia. Prednisone 90 mg daily started on 09/30/2016. He achieved complete response on 10/17/2016. He completed prednisone taper on 12/18/2016.  Current therapy: Observation and surveillance.   Interim History:  Mr. Richard Evans presents today for a follow-up visit. Since the last visit, he completed prednisone taper without any delayed complications. He denied any excessive fatigue or tiredness. He denied any fevers or chills or sweats. He is appetite remains excellent and have gained more weight. He denied any hematochezia or melena.  He denied any respiratory symptoms including cough or wheezing. He denied any hemoptysis. He was evaluated by Dr. Elsworth Soho from pulmonary medicine on 12/30/2016 and his respiratory status appears stable.   He does not report any headaches, blurry vision, syncope or seizures. He is not report any fevers or chills or weight loss. He does not report any chest pain, palpitation, orthopnea or leg edema. He does not report any cough, wheezing or hemoptysis. He does not report any nausea, vomiting or abdominal pain. He does not report any frequency urgency or hesitancy. He has not reported any skeletal  complaints. Remaining review of systems unremarkable   Medications: I have reviewed the patient's current medications.  Current Outpatient Prescriptions  Medication Sig Dispense Refill  . atenolol (TENORMIN) 25 MG tablet Take 25 mg by mouth at bedtime.     . diphenhydrAMINE (BENADRYL) 25 mg capsule Take 25 mg by mouth every 6 (six) hours as needed.    . folic acid (FOLVITE) 1 MG tablet TAKE 1 TABLET BY MOUTH EVERY DAY 30 tablet 1  . glipiZIDE (GLUCOTROL XL) 5 MG 24 hr tablet Take 5 mg by mouth daily before breakfast.     . losartan (COZAAR) 25 MG tablet     . metoprolol succinate (TOPROL-XL) 25 MG 24 hr tablet Take 25 mg by mouth daily. Pharmacy unable to get atenolol, taking this for now    . polyethylene glycol powder (CLEARLAX) powder Take 17 g by mouth daily as needed (constipation).    . predniSONE (DELTASONE) 10 MG tablet     . Tamsulosin HCl (FLOMAX) 0.4 MG CAPS Take 0.4 mg by mouth at bedtime.      No current facility-administered medications for this visit.      Allergies:  Allergies  Allergen Reactions  . Adhesive [Tape] Itching and Rash    Paper tape or wrapped bandage is preferred    Past Medical History, Surgical history, Social history, and Family History were reviewed and updated.  Physical Exam: Blood pressure 136/64, pulse 75, temperature 97.9 F (36.6 C), temperature source Oral, resp. rate 16, height 6' (1.829 m), weight 204 lb 1.6 oz (92.6 kg), SpO2 98 %. ECOG: 0 General appearance: Alert, awake gentleman without distress. Head: Normocephalic, without obvious abnormality. No oral thrush. Neck: no adenopathy Lymph  nodes: Cervical, supraclavicular, and axillary nodes normal. Heart:regular rate and rhythm, S1, S2 normal, no murmur, click, rub or gallop Lung:chest clear, no wheezing, rales, normal symmetric air entry.  Abdomin: Soft, nontender without rebound or guarding. Mild splenomegaly noted. EXT: No edema or erythema.   Lab Results: Lab Results   Component Value Date   WBC 8.5 01/01/2017   HGB 12.9 (L) 01/01/2017   HCT 37.2 (L) 01/01/2017   MCV 89.4 01/01/2017   PLT 129 (L) 01/01/2017     Chemistry      Component Value Date/Time   NA 144 12/25/2016 0850   K 4.3 12/25/2016 0850   CL 106 11/14/2016 0723   CO2 25 12/25/2016 0850   BUN 16.7 12/25/2016 0850   CREATININE 1.2 12/25/2016 0850      Component Value Date/Time   CALCIUM 9.6 12/25/2016 0850   ALKPHOS 71 12/25/2016 0850   AST 20 12/25/2016 0850   ALT 22 12/25/2016 0850   BILITOT 1.16 12/25/2016 0850     Bone Marrow Flow Cytometry - CHRONIC LYMPHOCYTIC LEUKEMIA/SMALL LYMPHOCYTIC LYMPHOMA, SEE COMMENT. Diagnosis Comment: There is a monoclonal population of B-cells with expression of CD5 and CD23. These findings are consistent with involvement by chronic lymphocytic leukemia/small lymphocytic lymphoma. Vicente Males MD Pathologist, Electronic Signature (Case signed 11/18/2016) GROSS AND MICROSCOPIC INFORMATION  Impression and Plan:   71 year old gentleman with the following issues:  1. Autoimmune hemolytic anemia presented with hemoglobin of 7.5 and positive Coombs testing. He was diagnosed In 06/30/2016.  His hemoglobin has normalized Initially on full dose prednisone. However, he has been tapered off completely and could not tolerate the taper with his hemoglobin is drifting down indicating relapse.  I recommended treatment with rituximab but he declined that option after agreeing initially.   He was started on prednisone 90 mg daily on 09/30/2016. His hemoglobin today is back to normal and feels well.   He completed a full steroid taper that concluded on 12/18/2016. The plan is to continue with very strict monitoring with weekly CBC at this time. I preference is to consolidate his remission with rituximab but is continued to decline. We will defer this option for the time being.  2. CLL: Diagnosed in December 2014. It is likely the etiology of his  autoimmune hemolytic anemia. He presented with very little to no adenopathy and mild splenomegaly.  The natural course of this disease was reviewed previously with the patient. Indication for treatment were discussed today which include autoimmune hemolysis, symptomatic lymphadenopathy or recurrent infections.   We will continue to monitor him regarding this issue.  3. Hyperglycemia: Managed by his primary care physician currently on oral hypoglycemic agent.  4. Splenomegaly: Related to CLL and not dramatically changed.  5. Follow-up: Will be weekly to check his CBC and a M.D. follow-up on 02/05/2017.   Zola Button, MD 1/31/201810:32 AM

## 2017-01-01 NOTE — Telephone Encounter (Signed)
Appointments scheduled per 01/01/17 los. Patient was given a copy of the AVS report and appointment schedule, per 01/01/17 los.

## 2017-01-08 ENCOUNTER — Other Ambulatory Visit (HOSPITAL_BASED_OUTPATIENT_CLINIC_OR_DEPARTMENT_OTHER): Payer: Medicare Other

## 2017-01-08 ENCOUNTER — Other Ambulatory Visit: Payer: Self-pay | Admitting: *Deleted

## 2017-01-08 ENCOUNTER — Telehealth: Payer: Self-pay | Admitting: *Deleted

## 2017-01-08 DIAGNOSIS — D599 Acquired hemolytic anemia, unspecified: Secondary | ICD-10-CM

## 2017-01-08 LAB — CBC WITH DIFFERENTIAL/PLATELET
BASO%: 0.4 % (ref 0.0–2.0)
BASOS ABS: 0 10*3/uL (ref 0.0–0.1)
EOS ABS: 0.3 10*3/uL (ref 0.0–0.5)
EOS%: 2.5 % (ref 0.0–7.0)
HCT: 38.3 % — ABNORMAL LOW (ref 38.4–49.9)
HEMOGLOBIN: 12.9 g/dL — AB (ref 13.0–17.1)
LYMPH%: 37.7 % (ref 14.0–49.0)
MCH: 30.7 pg (ref 27.2–33.4)
MCHC: 33.7 g/dL (ref 32.0–36.0)
MCV: 91.2 fL (ref 79.3–98.0)
MONO#: 1.3 10*3/uL — ABNORMAL HIGH (ref 0.1–0.9)
MONO%: 12.6 % (ref 0.0–14.0)
NEUT#: 4.8 10*3/uL (ref 1.5–6.5)
NEUT%: 46.8 % (ref 39.0–75.0)
Platelets: 146 10*3/uL (ref 140–400)
RBC: 4.2 10*6/uL (ref 4.20–5.82)
RDW: 14.7 % — AB (ref 11.0–14.6)
WBC: 10.4 10*3/uL — ABNORMAL HIGH (ref 4.0–10.3)
lymph#: 3.9 10*3/uL — ABNORMAL HIGH (ref 0.9–3.3)

## 2017-01-08 LAB — TECHNOLOGIST REVIEW

## 2017-01-08 MED ORDER — FOLIC ACID 1 MG PO TABS: 1.0000 mg | ORAL_TABLET | Freq: Every day | ORAL | 1 refills | Status: DC

## 2017-01-08 NOTE — Telephone Encounter (Signed)
-----   Message from Galano Portela, MD sent at 01/08/2017  8:58 AM EST ----- Please let him know his hgb is unchanged.

## 2017-01-08 NOTE — Telephone Encounter (Signed)
Spoke with wife, gave results of last lab test

## 2017-01-09 ENCOUNTER — Other Ambulatory Visit: Payer: Self-pay | Admitting: Oncology

## 2017-01-15 ENCOUNTER — Telehealth: Payer: Self-pay | Admitting: *Deleted

## 2017-01-15 ENCOUNTER — Other Ambulatory Visit (HOSPITAL_BASED_OUTPATIENT_CLINIC_OR_DEPARTMENT_OTHER): Payer: Medicare Other

## 2017-01-15 DIAGNOSIS — D599 Acquired hemolytic anemia, unspecified: Secondary | ICD-10-CM | POA: Diagnosis present

## 2017-01-15 LAB — CBC WITH DIFFERENTIAL/PLATELET
BASO%: 0.7 % (ref 0.0–2.0)
BASOS ABS: 0.1 10*3/uL (ref 0.0–0.1)
EOS%: 2.5 % (ref 0.0–7.0)
Eosinophils Absolute: 0.2 10*3/uL (ref 0.0–0.5)
HEMATOCRIT: 38.2 % — AB (ref 38.4–49.9)
HGB: 13.2 g/dL (ref 13.0–17.1)
LYMPH#: 3.5 10*3/uL — AB (ref 0.9–3.3)
LYMPH%: 37 % (ref 14.0–49.0)
MCH: 30.9 pg (ref 27.2–33.4)
MCHC: 34.6 g/dL (ref 32.0–36.0)
MCV: 89.2 fL (ref 79.3–98.0)
MONO#: 0.9 10*3/uL (ref 0.1–0.9)
MONO%: 9 % (ref 0.0–14.0)
NEUT#: 4.8 10*3/uL (ref 1.5–6.5)
NEUT%: 50.8 % (ref 39.0–75.0)
Platelets: 151 10*3/uL (ref 140–400)
RBC: 4.28 10*6/uL (ref 4.20–5.82)
RDW: 14.4 % (ref 11.0–14.6)
WBC: 9.5 10*3/uL (ref 4.0–10.3)

## 2017-01-15 NOTE — Telephone Encounter (Signed)
As noted by Dr. Alen Blew, I informed patient's wife of his hemoglobin level. No changes at this time. Wife verbalized understanding.

## 2017-01-15 NOTE — Telephone Encounter (Signed)
-----   Message from Schwebach Portela, MD sent at 01/15/2017 10:10 AM EST ----- Please let him know his hgb is perfect. No changes needed.

## 2017-01-22 ENCOUNTER — Other Ambulatory Visit (HOSPITAL_BASED_OUTPATIENT_CLINIC_OR_DEPARTMENT_OTHER): Payer: Medicare Other

## 2017-01-22 ENCOUNTER — Telehealth: Payer: Self-pay | Admitting: *Deleted

## 2017-01-22 DIAGNOSIS — D599 Acquired hemolytic anemia, unspecified: Secondary | ICD-10-CM

## 2017-01-22 LAB — COMPREHENSIVE METABOLIC PANEL
ALBUMIN: 4 g/dL (ref 3.5–5.0)
ALK PHOS: 74 U/L (ref 40–150)
ALT: 26 U/L (ref 0–55)
AST: 20 U/L (ref 5–34)
Anion Gap: 7 mEq/L (ref 3–11)
BUN: 18.5 mg/dL (ref 7.0–26.0)
CO2: 26 meq/L (ref 22–29)
Calcium: 9.7 mg/dL (ref 8.4–10.4)
Chloride: 108 mEq/L (ref 98–109)
Creatinine: 1.2 mg/dL (ref 0.7–1.3)
EGFR: 60 mL/min/{1.73_m2} — ABNORMAL LOW (ref >90)
GLUCOSE: 170 mg/dL — AB (ref 70–140)
POTASSIUM: 4.6 meq/L (ref 3.5–5.1)
SODIUM: 141 meq/L (ref 136–145)
TOTAL PROTEIN: 7 g/dL (ref 6.4–8.3)
Total Bilirubin: 1.31 mg/dL — ABNORMAL HIGH (ref 0.20–1.20)

## 2017-01-22 LAB — CBC WITH DIFFERENTIAL/PLATELET
BASO%: 1 % (ref 0.0–2.0)
Basophils Absolute: 0.1 10*3/uL (ref 0.0–0.1)
EOS%: 3 % (ref 0.0–7.0)
Eosinophils Absolute: 0.3 10*3/uL (ref 0.0–0.5)
HCT: 36.8 % — ABNORMAL LOW (ref 38.4–49.9)
HEMOGLOBIN: 12.9 g/dL — AB (ref 13.0–17.1)
LYMPH%: 38 % (ref 14.0–49.0)
MCH: 31.5 pg (ref 27.2–33.4)
MCHC: 35 g/dL (ref 32.0–36.0)
MCV: 89.9 fL (ref 79.3–98.0)
MONO#: 0.9 10*3/uL (ref 0.1–0.9)
MONO%: 10.2 % (ref 0.0–14.0)
NEUT#: 4.1 10*3/uL (ref 1.5–6.5)
NEUT%: 47.8 % (ref 39.0–75.0)
Platelets: 145 10*3/uL (ref 140–400)
RBC: 4.1 10*6/uL — AB (ref 4.20–5.82)
RDW: 14.9 % — AB (ref 11.0–14.6)
WBC: 8.6 10*3/uL (ref 4.0–10.3)
lymph#: 3.3 10*3/uL (ref 0.9–3.3)

## 2017-01-22 LAB — LACTATE DEHYDROGENASE: LDH: 316 U/L — ABNORMAL HIGH (ref 125–245)

## 2017-01-22 NOTE — Telephone Encounter (Signed)
-----   Message from Regan Portela, MD sent at 01/22/2017  9:11 AM EST ----- Please let him know his Hgb is normal.

## 2017-01-22 NOTE — Telephone Encounter (Signed)
As noted below by Dr. Alen Blew, I informed patient's wife that his hemoglobin is normal. Wife verbalized understanding.

## 2017-01-23 LAB — HAPTOGLOBIN: Haptoglobin: <10 mg/dL — ABNORMAL LOW (ref 34–200)

## 2017-01-23 LAB — DIRECT ANTIGLOBULIN TEST (NOT AT ARMC): COOMBS', DIRECT: POSITIVE — AB

## 2017-01-29 ENCOUNTER — Other Ambulatory Visit (HOSPITAL_BASED_OUTPATIENT_CLINIC_OR_DEPARTMENT_OTHER): Payer: Medicare Other

## 2017-01-29 DIAGNOSIS — D599 Acquired hemolytic anemia, unspecified: Secondary | ICD-10-CM | POA: Diagnosis present

## 2017-01-29 LAB — TECHNOLOGIST REVIEW

## 2017-01-29 LAB — CBC WITH DIFFERENTIAL/PLATELET
BASO%: 0.3 % (ref 0.0–2.0)
Basophils Absolute: 0 10*3/uL (ref 0.0–0.1)
EOS%: 2.5 % (ref 0.0–7.0)
Eosinophils Absolute: 0.2 10*3/uL (ref 0.0–0.5)
HCT: 36.6 % — ABNORMAL LOW (ref 38.4–49.9)
HGB: 12.9 g/dL — ABNORMAL LOW (ref 13.0–17.1)
LYMPH%: 31.8 % (ref 14.0–49.0)
MCH: 31.4 pg (ref 27.2–33.4)
MCHC: 35.2 g/dL (ref 32.0–36.0)
MCV: 89.1 fL (ref 79.3–98.0)
MONO#: 1.5 10*3/uL — AB (ref 0.1–0.9)
MONO%: 17.1 % — ABNORMAL HIGH (ref 0.0–14.0)
NEUT#: 4.2 10*3/uL (ref 1.5–6.5)
NEUT%: 48.3 % (ref 39.0–75.0)
PLATELETS: 124 10*3/uL — AB (ref 140–400)
RBC: 4.11 10*6/uL — AB (ref 4.20–5.82)
RDW: 14.6 % (ref 11.0–14.6)
WBC: 8.7 10*3/uL (ref 4.0–10.3)
lymph#: 2.8 10*3/uL (ref 0.9–3.3)

## 2017-02-05 ENCOUNTER — Ambulatory Visit (HOSPITAL_BASED_OUTPATIENT_CLINIC_OR_DEPARTMENT_OTHER): Payer: Medicare Other | Admitting: Oncology

## 2017-02-05 ENCOUNTER — Other Ambulatory Visit (HOSPITAL_BASED_OUTPATIENT_CLINIC_OR_DEPARTMENT_OTHER): Payer: Medicare Other

## 2017-02-05 ENCOUNTER — Telehealth: Payer: Self-pay | Admitting: Oncology

## 2017-02-05 VITALS — BP 102/61 | HR 80 | Temp 97.8°F | Resp 17 | Ht 72.0 in | Wt 205.3 lb

## 2017-02-05 DIAGNOSIS — D599 Acquired hemolytic anemia, unspecified: Secondary | ICD-10-CM

## 2017-02-05 LAB — CBC WITH DIFFERENTIAL/PLATELET
BASO%: 0.7 % (ref 0.0–2.0)
BASOS ABS: 0.1 10*3/uL (ref 0.0–0.1)
EOS%: 2.6 % (ref 0.0–7.0)
Eosinophils Absolute: 0.3 10*3/uL (ref 0.0–0.5)
HCT: 38.8 % (ref 38.4–49.9)
HGB: 13.6 g/dL (ref 13.0–17.1)
LYMPH%: 36.9 % (ref 14.0–49.0)
MCH: 31.5 pg (ref 27.2–33.4)
MCHC: 35 g/dL (ref 32.0–36.0)
MCV: 90 fL (ref 79.3–98.0)
MONO#: 1 10*3/uL — ABNORMAL HIGH (ref 0.1–0.9)
MONO%: 9.6 % (ref 0.0–14.0)
NEUT%: 50.2 % (ref 39.0–75.0)
NEUTROS ABS: 5.1 10*3/uL (ref 1.5–6.5)
Platelets: 170 10*3/uL (ref 140–400)
RBC: 4.31 10*6/uL (ref 4.20–5.82)
RDW: 14.9 % — AB (ref 11.0–14.6)
WBC: 10.2 10*3/uL (ref 4.0–10.3)
lymph#: 3.8 10*3/uL — ABNORMAL HIGH (ref 0.9–3.3)

## 2017-02-05 LAB — TECHNOLOGIST REVIEW

## 2017-02-05 NOTE — Progress Notes (Signed)
Hematology and Oncology Follow Up Visit  Richard Evans [id number] 13-Nov-1946 71 y.o. 02/05/2017 12:44 PM Dhruv [name], MDVyas, [name], MD   Principle Diagnosis: 71 year old gentleman with autoimmune hemolytic anemia diagnosed on July 30 of 2017. He presented with hemoglobin of 7.5, MCV 106 with elevated reticulocyte count and haptoglobin less than 10. Coombs testing was positive. CT scan of the chest abdomen and pelvis showed nonspecific hilar adenopathy. Bone marrow biopsy obtained on 11/14/2016 confirmed the diagnosis of CLL.   Prior Therapy:   Status post packed red cell transfusion for 1 unit on 07/04/2016. Prednisone 70 mg daily started on 06/30/2016. He achieved complete response and prednisone tapered completely off on 09/11/2016. He relapsed quickly with worsening anemia. Prednisone 90 mg daily started on 09/30/2016. He achieved complete response on 10/17/2016. He completed prednisone taper on 12/18/2016.  Current therapy: Observation and surveillance.   Interim History:  Richard Evans presents today for a follow-up visit. Since the last visit, he continues to do well and noticed improvement in his overall health. His exercise tolerance have improved and is able to exercise regularly. He is eating reasonably well and maintaining his weight. He denied any recent bleeding complications. He does report some occasional arthralgias since he is been off prednisone and currently takes anti-inflammatories as needed.   He does not report any headaches, blurry vision, syncope or seizures. He is not report any fevers or chills or weight loss. He does not report any chest pain, palpitation, orthopnea or leg edema. He does not report any cough, wheezing or hemoptysis. He does not report any nausea, vomiting or abdominal pain. He does not report any frequency urgency or hesitancy. He has not reported any skeletal complaints. Remaining review of systems unremarkable   Medications: I have reviewed the  patient's current medications.  Current Outpatient Prescriptions  Medication Sig Dispense Refill  . atenolol (TENORMIN) 25 MG tablet Take 25 mg by mouth at bedtime.     . diphenhydrAMINE (BENADRYL) 25 mg capsule Take 25 mg by mouth every 6 (six) hours as needed.    . folic acid (FOLVITE) 1 MG tablet Take 1 tablet (1 mg total) by mouth daily. 30 tablet 1  . glipiZIDE (GLUCOTROL XL) 5 MG 24 hr tablet Take 5 mg by mouth daily before breakfast.     . losartan (COZAAR) 25 MG tablet     . metoprolol succinate (TOPROL-XL) 25 MG 24 hr tablet Take 25 mg by mouth daily. Pharmacy unable to get atenolol, taking this for now    . polyethylene glycol powder (CLEARLAX) powder Take 17 g by mouth daily as needed (constipation).    . predniSONE (DELTASONE) 10 MG tablet TAKE 6 TABLETS BY MOUTH EVERY DAY WITH BREAKFAST 100 tablet 0  . Tamsulosin HCl (FLOMAX) 0.4 MG CAPS Take 0.4 mg by mouth at bedtime.      No current facility-administered medications for this visit.      Allergies:  Allergies  Allergen Reactions  . Adhesive [Tape] Itching and Rash    Paper tape or wrapped bandage is preferred    Past Medical History, Surgical history, Social history, and Family History were reviewed and updated.  Physical Exam: Blood pressure 102/61, pulse 80, temperature 97.8 F (36.6 C), temperature source Oral, resp. rate 17, height 6' (1.829 m), weight 205 lb 4.8 oz (93.1 kg), SpO2 98 %. ECOG: 0 General appearance: Well-appearing gentleman appeared without distress. Head: Normocephalic, without obvious abnormality. No oral ulcers or thrush. Neck: no adenopathy Lymph nodes:  Cervical, supraclavicular, and axillary nodes normal. Heart:regular rate and rhythm, S1, S2 normal, no murmur, click, rub or gallop Lung:chest clear, no wheezing, rales, normal symmetric air entry.  Abdomin: Soft, nontender without rebound or guarding. No rebound or guarding. EXT: No edema or erythema.   Lab Results: Lab Results   Component Value Date   WBC 10.2 02/05/2017   HGB 13.6 02/05/2017   HCT 38.8 02/05/2017   MCV 90.0 02/05/2017   PLT 170 02/05/2017     Chemistry      Component Value Date/Time   NA 141 01/22/2017 0824   K 4.6 01/22/2017 0824   CL 106 11/14/2016 0723   CO2 26 01/22/2017 0824   BUN 18.5 01/22/2017 0824   CREATININE 1.2 01/22/2017 0824      Component Value Date/Time   CALCIUM 9.7 01/22/2017 0824   ALKPHOS 74 01/22/2017 0824   AST 20 01/22/2017 0824   ALT 26 01/22/2017 0824   BILITOT 1.31 (H) 01/22/2017 0824     Bone Marrow Flow Cytometry - CHRONIC LYMPHOCYTIC LEUKEMIA/SMALL LYMPHOCYTIC LYMPHOMA, SEE COMMENT. Diagnosis Comment: There is a monoclonal population of B-cells with expression of CD5 and CD23. These findings are consistent with involvement by chronic lymphocytic leukemia/small lymphocytic lymphoma. Vicente Males MD Pathologist, Electronic Signature (Case signed 11/18/2016) GROSS AND MICROSCOPIC INFORMATION  Impression and Plan:   71 year old gentleman with the following issues:  1. Autoimmune hemolytic anemia presented with hemoglobin of 7.5 and positive Coombs testing. He was diagnosed In 06/30/2016.  He achieved complete response after initial prednisone therapy but relapsed after prednisone taper..  I recommended treatment with rituximab but he declined that option after agreeing initially.   He was started on prednisone 90 mg daily on 09/30/2016. He completed a steroid taper in January 2018 after achieving complete response.  His history was reviewed today and continue to show complete response. No further treatment is needed at this time. I prefer he is treated with rituximab to maintain his remission but he continued to decline.  2. CLL: Diagnosed in December 2014. It is likely the etiology of his autoimmune hemolytic anemia. He presented with very little to no adenopathy and mild splenomegaly.  I see no evidence of progression of disease or indication  for treatment. I have recommended observation and surveillance at this time.  3. Hyperglycemia: Managed by his primary care physician currently on oral hypoglycemic agent.  4. Splenomegaly: Related to CLL and not dramatically changed.  5. Follow-up: He will continue to have laboratory testing every 2 weeks and a follow-up in 2 months.   Zola Button, MD 3/7/201812:44 PM

## 2017-02-05 NOTE — Telephone Encounter (Signed)
Gave patient AVS and calender per 02/05/2017 los 

## 2017-02-19 ENCOUNTER — Other Ambulatory Visit (HOSPITAL_BASED_OUTPATIENT_CLINIC_OR_DEPARTMENT_OTHER): Payer: Medicare Other

## 2017-02-19 ENCOUNTER — Telehealth: Payer: Self-pay | Admitting: *Deleted

## 2017-02-19 DIAGNOSIS — D599 Acquired hemolytic anemia, unspecified: Secondary | ICD-10-CM

## 2017-02-19 LAB — CBC WITH DIFFERENTIAL/PLATELET
BASO%: 0.7 % (ref 0.0–2.0)
BASOS ABS: 0.1 10*3/uL (ref 0.0–0.1)
EOS ABS: 0.2 10*3/uL (ref 0.0–0.5)
EOS%: 2.7 % (ref 0.0–7.0)
HCT: 37.6 % — ABNORMAL LOW (ref 38.4–49.9)
HEMOGLOBIN: 13.2 g/dL (ref 13.0–17.1)
LYMPH%: 39.9 % (ref 14.0–49.0)
MCH: 32.2 pg (ref 27.2–33.4)
MCHC: 35.2 g/dL (ref 32.0–36.0)
MCV: 91.7 fL (ref 79.3–98.0)
MONO#: 1 10*3/uL — ABNORMAL HIGH (ref 0.1–0.9)
MONO%: 10.6 % (ref 0.0–14.0)
NEUT#: 4.3 10*3/uL (ref 1.5–6.5)
NEUT%: 46.1 % (ref 39.0–75.0)
Platelets: 150 10*3/uL (ref 140–400)
RBC: 4.11 10*6/uL — ABNORMAL LOW (ref 4.20–5.82)
RDW: 14.5 % (ref 11.0–14.6)
WBC: 9.3 10*3/uL (ref 4.0–10.3)
lymph#: 3.7 10*3/uL — ABNORMAL HIGH (ref 0.9–3.3)

## 2017-02-19 NOTE — Telephone Encounter (Signed)
As noted below by Dr. Alen Blew, I left a message for patient informing of his hemoglobin level. Instructed him to call [facility] if he had any questions or concerns.

## 2017-02-19 NOTE — Telephone Encounter (Signed)
-----   Message from Erlandson Portela, MD sent at 02/19/2017  9:27 AM EDT ----- Please let him know Hgb is normal

## 2017-02-21 DIAGNOSIS — E785 Hyperlipidemia, unspecified: Secondary | ICD-10-CM | POA: Diagnosis not present

## 2017-02-21 DIAGNOSIS — Z299 Encounter for prophylactic measures, unspecified: Secondary | ICD-10-CM | POA: Diagnosis not present

## 2017-02-21 DIAGNOSIS — E1165 Type 2 diabetes mellitus with hyperglycemia: Secondary | ICD-10-CM | POA: Diagnosis not present

## 2017-02-21 DIAGNOSIS — G459 Transient cerebral ischemic attack, unspecified: Secondary | ICD-10-CM | POA: Diagnosis not present

## 2017-02-21 DIAGNOSIS — C9190 Lymphoid leukemia, unspecified not having achieved remission: Secondary | ICD-10-CM | POA: Diagnosis not present

## 2017-02-21 DIAGNOSIS — J181 Lobar pneumonia, unspecified organism: Secondary | ICD-10-CM | POA: Diagnosis not present

## 2017-02-21 DIAGNOSIS — N400 Enlarged prostate without lower urinary tract symptoms: Secondary | ICD-10-CM | POA: Diagnosis not present

## 2017-02-21 DIAGNOSIS — I10 Essential (primary) hypertension: Secondary | ICD-10-CM | POA: Diagnosis not present

## 2017-02-21 DIAGNOSIS — Z713 Dietary counseling and surveillance: Secondary | ICD-10-CM | POA: Diagnosis not present

## 2017-02-21 DIAGNOSIS — D589 Hereditary hemolytic anemia, unspecified: Secondary | ICD-10-CM | POA: Diagnosis not present

## 2017-03-05 ENCOUNTER — Other Ambulatory Visit (HOSPITAL_BASED_OUTPATIENT_CLINIC_OR_DEPARTMENT_OTHER): Payer: Medicare Other

## 2017-03-05 ENCOUNTER — Telehealth: Payer: Self-pay | Admitting: *Deleted

## 2017-03-05 DIAGNOSIS — D599 Acquired hemolytic anemia, unspecified: Secondary | ICD-10-CM | POA: Diagnosis present

## 2017-03-05 LAB — CBC WITH DIFFERENTIAL/PLATELET
BASO%: 0.6 % (ref 0.0–2.0)
Basophils Absolute: 0.1 10*3/uL (ref 0.0–0.1)
EOS ABS: 0.2 10*3/uL (ref 0.0–0.5)
EOS%: 2.7 % (ref 0.0–7.0)
HCT: 38.9 % (ref 38.4–49.9)
HGB: 13.7 g/dL (ref 13.0–17.1)
LYMPH%: 34.7 % (ref 14.0–49.0)
MCH: 32 pg (ref 27.2–33.4)
MCHC: 35.1 g/dL (ref 32.0–36.0)
MCV: 91.2 fL (ref 79.3–98.0)
MONO#: 0.8 10*3/uL (ref 0.1–0.9)
MONO%: 9.3 % (ref 0.0–14.0)
NEUT%: 52.7 % (ref 39.0–75.0)
NEUTROS ABS: 4.6 10*3/uL (ref 1.5–6.5)
Platelets: 156 10*3/uL (ref 140–400)
RBC: 4.27 10*6/uL (ref 4.20–5.82)
RDW: 14.2 % (ref 11.0–14.6)
WBC: 8.7 10*3/uL (ref 4.0–10.3)
lymph#: 3 10*3/uL (ref 0.9–3.3)

## 2017-03-05 NOTE — Telephone Encounter (Signed)
As noted below by Dr. Alen Blew, I informed wife that his hemoglobin is still normal and no changes at this time. Wife verbalized understanding.

## 2017-03-05 NOTE — Telephone Encounter (Signed)
-----   Message from Derhammer Portela, MD sent at 03/05/2017  8:42 AM EDT ----- Please let him know his hgb still normal. No change.

## 2017-03-10 NOTE — Telephone Encounter (Signed)
error 

## 2017-03-19 ENCOUNTER — Telehealth: Payer: Self-pay | Admitting: *Deleted

## 2017-03-19 ENCOUNTER — Other Ambulatory Visit (HOSPITAL_BASED_OUTPATIENT_CLINIC_OR_DEPARTMENT_OTHER): Payer: Medicare Other

## 2017-03-19 DIAGNOSIS — D599 Acquired hemolytic anemia, unspecified: Secondary | ICD-10-CM | POA: Diagnosis present

## 2017-03-19 LAB — TECHNOLOGIST REVIEW

## 2017-03-19 LAB — CBC WITH DIFFERENTIAL/PLATELET
BASO%: 0.4 % (ref 0.0–2.0)
BASOS ABS: 0 10*3/uL (ref 0.0–0.1)
EOS ABS: 0.2 10*3/uL (ref 0.0–0.5)
EOS%: 3 % (ref 0.0–7.0)
HCT: 37.4 % — ABNORMAL LOW (ref 38.4–49.9)
HEMOGLOBIN: 13.2 g/dL (ref 13.0–17.1)
LYMPH#: 3.3 10*3/uL (ref 0.9–3.3)
LYMPH%: 40.4 % (ref 14.0–49.0)
MCH: 32.3 pg (ref 27.2–33.4)
MCHC: 35.3 g/dL (ref 32.0–36.0)
MCV: 91.4 fL (ref 79.3–98.0)
MONO#: 1 10*3/uL — AB (ref 0.1–0.9)
MONO%: 12.7 % (ref 0.0–14.0)
NEUT%: 43.5 % (ref 39.0–75.0)
NEUTROS ABS: 3.5 10*3/uL (ref 1.5–6.5)
Platelets: 139 10*3/uL — ABNORMAL LOW (ref 140–400)
RBC: 4.09 10*6/uL — ABNORMAL LOW (ref 4.20–5.82)
RDW: 14.2 % (ref 11.0–14.6)
WBC: 8.1 10*3/uL (ref 4.0–10.3)

## 2017-03-19 NOTE — Telephone Encounter (Signed)
-----   Message from Vanburen Portela, MD sent at 03/19/2017 10:09 AM EDT ----- Please let him know his hgb is normal.

## 2017-03-19 NOTE — Telephone Encounter (Signed)
As noted below by Dr. Alen Blew, I informed patient's wife of hemoglobin level. She verbalized understanding.

## 2017-04-02 ENCOUNTER — Telehealth: Payer: Self-pay | Admitting: Oncology

## 2017-04-02 ENCOUNTER — Other Ambulatory Visit (HOSPITAL_BASED_OUTPATIENT_CLINIC_OR_DEPARTMENT_OTHER): Payer: Medicare Other

## 2017-04-02 ENCOUNTER — Ambulatory Visit (HOSPITAL_BASED_OUTPATIENT_CLINIC_OR_DEPARTMENT_OTHER): Payer: Medicare Other | Admitting: Oncology

## 2017-04-02 VITALS — BP 119/67 | HR 62 | Temp 98.2°F | Resp 17 | Wt 199.4 lb

## 2017-04-02 DIAGNOSIS — D599 Acquired hemolytic anemia, unspecified: Secondary | ICD-10-CM

## 2017-04-02 DIAGNOSIS — C9110 Chronic lymphocytic leukemia of B-cell type not having achieved remission: Secondary | ICD-10-CM

## 2017-04-02 DIAGNOSIS — C9190 Lymphoid leukemia, unspecified not having achieved remission: Secondary | ICD-10-CM

## 2017-04-02 LAB — CBC WITH DIFFERENTIAL/PLATELET
BASO%: 0.4 % (ref 0.0–2.0)
Basophils Absolute: 0 10*3/uL (ref 0.0–0.1)
EOS ABS: 0.2 10*3/uL (ref 0.0–0.5)
EOS%: 2.6 % (ref 0.0–7.0)
HEMATOCRIT: 37.3 % — AB (ref 38.4–49.9)
HGB: 13.1 g/dL (ref 13.0–17.1)
LYMPH#: 3.1 10*3/uL (ref 0.9–3.3)
LYMPH%: 40.8 % (ref 14.0–49.0)
MCH: 32.4 pg (ref 27.2–33.4)
MCHC: 35.2 g/dL (ref 32.0–36.0)
MCV: 92 fL (ref 79.3–98.0)
MONO#: 0.7 10*3/uL (ref 0.1–0.9)
MONO%: 9.2 % (ref 0.0–14.0)
NEUT%: 47 % (ref 39.0–75.0)
NEUTROS ABS: 3.6 10*3/uL (ref 1.5–6.5)
PLATELETS: 150 10*3/uL (ref 140–400)
RBC: 4.05 10*6/uL — ABNORMAL LOW (ref 4.20–5.82)
RDW: 14.6 % (ref 11.0–14.6)
WBC: 7.6 10*3/uL (ref 4.0–10.3)

## 2017-04-02 NOTE — Telephone Encounter (Signed)
Gave patient avs report and appointments for May thru July. Patient having lab q3w - due to patient being out of town the week of 7/4 (6/29 thru 7/15) schedule lab due to 7/4 for 6//27.

## 2017-04-02 NOTE — Progress Notes (Signed)
Hematology and Oncology Follow Up Visit  Richard Evans [id number] 1946/07/16 71 y.o. 04/02/2017 9:16 AM Richard Evans, MDVyas, Richard Hatcher, MD   Principle Diagnosis: 71 year old gentleman with autoimmune hemolytic anemia diagnosed on July 30 of 2017. He presented with hemoglobin of 7.5, MCV 106 with elevated reticulocyte count and haptoglobin less than 10. Coombs testing was positive. CT scan of the chest abdomen and pelvis showed nonspecific hilar adenopathy. Bone marrow biopsy obtained on 11/14/2016 confirmed the diagnosis of CLL.   Prior Therapy:   Status post packed red cell transfusion for 1 unit on 07/04/2016. Prednisone 70 mg daily started on 06/30/2016. He achieved complete response and prednisone tapered completely off on 09/11/2016. He relapsed quickly with worsening anemia. Prednisone 90 mg daily started on 09/30/2016. He achieved complete response on 10/17/2016. He completed prednisone taper on 12/18/2016.  Current therapy: Observation and surveillance.   Interim History:  [name] presents today for a follow-up visit. Since the last visit, he reports no complaints. He is back to his baseline quality of life, performance status and activity level. He remains active and continues to work outside his house. He is eating reasonably well and trying to lose the excess weight she gained with prednisone. He denied any recent bleeding complications. He denied any lymphadenopathy, petechiae or any other constitutional symptoms.   He does not report any headaches, blurry vision, syncope or seizures. He is not report any fevers or chills or weight loss. He does not report any chest pain, palpitation, orthopnea or leg edema. He does not report any cough, wheezing or hemoptysis. He does not report any nausea, vomiting or abdominal pain. He does not report any frequency urgency or hesitancy. He has not reported any skeletal complaints. Remaining review of systems unremarkable   Medications: I have  reviewed the patient's current medications.  Current Outpatient Prescriptions  Medication Sig Dispense Refill  . atenolol (TENORMIN) 25 MG tablet Take 25 mg by mouth at bedtime.     . diphenhydrAMINE (BENADRYL) 25 mg capsule Take 25 mg by mouth every 6 (six) hours as needed.    . folic acid (FOLVITE) 1 MG tablet Take 1 tablet (1 mg total) by mouth daily. 30 tablet 1  . metoprolol succinate (TOPROL-XL) 25 MG 24 hr tablet Take 25 mg by mouth daily. Pharmacy unable to get atenolol, taking this for now    . polyethylene glycol powder (CLEARLAX) powder Take 17 g by mouth daily as needed (constipation).    . Tamsulosin HCl (FLOMAX) 0.4 MG CAPS Take 0.4 mg by mouth at bedtime.     Marland Kitchen glipiZIDE (GLUCOTROL XL) 5 MG 24 hr tablet Take 5 mg by mouth daily before breakfast.      No current facility-administered medications for this visit.      Allergies:  Allergies  Allergen Reactions  . Adhesive [Tape] Itching and Rash    Paper tape or wrapped bandage is preferred    Past Medical History, Surgical history, Social history, and Family History were reviewed and updated.  Physical Exam: Blood pressure 119/67, pulse 62, temperature 98.2 F (36.8 C), temperature source Oral, resp. rate 17, weight 199 lb 6.4 oz (90.4 kg), SpO2 96 %. ECOG: 0 General appearance: Alert, awake gentleman without distress. Head: Normocephalic, without obvious abnormality. No oral thrush or ulcers. Neck: no adenopathy Lymph nodes: Cervical, supraclavicular, and axillary nodes normal. Heart:regular rate and rhythm, S1, S2 normal, no murmur, click, rub or gallop Lung:chest clear, no wheezing, rales, normal symmetric air entry.  Abdomin: Soft, nontender without rebound or guarding. No shifting dullness or ascites. EXT: No edema or erythema.   Lab Results: Lab Results  Component Value Date   WBC 7.6 04/02/2017   HGB 13.1 04/02/2017   HCT 37.3 (L) 04/02/2017   MCV 92.0 04/02/2017   PLT 150 04/02/2017     Chemistry       Component Value Date/Time   NA 141 01/22/2017 0824   K 4.6 01/22/2017 0824   CL 106 11/14/2016 0723   CO2 26 01/22/2017 0824   BUN 18.5 01/22/2017 0824   CREATININE 1.2 01/22/2017 0824      Component Value Date/Time   CALCIUM 9.7 01/22/2017 0824   ALKPHOS 74 01/22/2017 0824   AST 20 01/22/2017 0824   ALT 26 01/22/2017 0824   BILITOT 1.31 (H) 01/22/2017 0824      GROSS AND MICROSCOPIC INFORMATION  Impression and Plan:   71 year old gentleman with the following issues:  1. Autoimmune hemolytic anemia presented with hemoglobin of 7.5 and positive Coombs testing. He was diagnosed In 06/30/2016.  He achieved complete response after initial prednisone therapy but relapsed after prednisone taper.  I recommended treatment with rituximab but he declined that option after agreeing initially.   He was started on prednisone 90 mg daily on 09/30/2016. He completed a steroid taper in January 2018 after achieving complete response.  His hemoglobin continues to be within normal range without any evidence of relapse. The plan is to continue with active surveillance at this time.  2. CLL: Diagnosed in December 2014. It is likely the etiology of his autoimmune hemolytic anemia. He presented with very little to no adenopathy and mild splenomegaly.  I see no evidence of progression of disease or indication for treatment. I have recommended observation and surveillance at this time. Indication for treatment were reviewed today which include symptomatic adenopathy and other cytopenias.  3. Hyperglycemia: Managed by his primary care physician currently on oral hypoglycemic agent.  4. Splenomegaly: Related to CLL and not dramatically changed.  5. Follow-up: He will continue to have laboratory testing every 3 weeks and a follow-up in July 2018.   [facility], MD 5/2/20189:16 AM

## 2017-04-23 ENCOUNTER — Other Ambulatory Visit (HOSPITAL_BASED_OUTPATIENT_CLINIC_OR_DEPARTMENT_OTHER): Payer: Medicare Other

## 2017-04-23 DIAGNOSIS — D599 Acquired hemolytic anemia, unspecified: Secondary | ICD-10-CM

## 2017-04-23 DIAGNOSIS — C9110 Chronic lymphocytic leukemia of B-cell type not having achieved remission: Secondary | ICD-10-CM

## 2017-04-23 LAB — CBC WITH DIFFERENTIAL/PLATELET
BASO%: 0.6 % (ref 0.0–2.0)
Basophils Absolute: 0.1 10*3/uL (ref 0.0–0.1)
EOS%: 2.8 % (ref 0.0–7.0)
Eosinophils Absolute: 0.2 10*3/uL (ref 0.0–0.5)
HEMATOCRIT: 37.4 % — AB (ref 38.4–49.9)
HEMOGLOBIN: 13.2 g/dL (ref 13.0–17.1)
LYMPH#: 3.3 10*3/uL (ref 0.9–3.3)
LYMPH%: 38.7 % (ref 14.0–49.0)
MCH: 32.5 pg (ref 27.2–33.4)
MCHC: 35.4 g/dL (ref 32.0–36.0)
MCV: 92 fL (ref 79.3–98.0)
MONO#: 0.9 10*3/uL (ref 0.1–0.9)
MONO%: 10 % (ref 0.0–14.0)
NEUT%: 47.9 % (ref 39.0–75.0)
NEUTROS ABS: 4.1 10*3/uL (ref 1.5–6.5)
PLATELETS: 150 10*3/uL (ref 140–400)
RBC: 4.07 10*6/uL — ABNORMAL LOW (ref 4.20–5.82)
RDW: 14.6 % (ref 11.0–14.6)
WBC: 8.6 10*3/uL (ref 4.0–10.3)

## 2017-05-07 ENCOUNTER — Telehealth: Payer: Self-pay | Admitting: *Deleted

## 2017-05-07 NOTE — Telephone Encounter (Signed)
-----   Message from Grinage Portela, MD sent at 04/23/2017  9:20 AM EDT ----- Please let him know his hgb is normal.

## 2017-05-07 NOTE — Telephone Encounter (Signed)
Spoke with patient's wife.  Patient's hgb is normal.

## 2017-05-14 ENCOUNTER — Other Ambulatory Visit (HOSPITAL_BASED_OUTPATIENT_CLINIC_OR_DEPARTMENT_OTHER): Payer: Medicare Other

## 2017-05-14 ENCOUNTER — Telehealth: Payer: Self-pay | Admitting: *Deleted

## 2017-05-14 DIAGNOSIS — C9110 Chronic lymphocytic leukemia of B-cell type not having achieved remission: Secondary | ICD-10-CM | POA: Diagnosis present

## 2017-05-14 DIAGNOSIS — D599 Acquired hemolytic anemia, unspecified: Secondary | ICD-10-CM

## 2017-05-14 LAB — CBC WITH DIFFERENTIAL/PLATELET
BASO%: 0.6 % (ref 0.0–2.0)
Basophils Absolute: 0 10*3/uL (ref 0.0–0.1)
EOS ABS: 0.3 10*3/uL (ref 0.0–0.5)
EOS%: 3.7 % (ref 0.0–7.0)
HEMATOCRIT: 35.4 % — AB (ref 38.4–49.9)
HGB: 12.3 g/dL — ABNORMAL LOW (ref 13.0–17.1)
LYMPH#: 3 10*3/uL (ref 0.9–3.3)
LYMPH%: 41.1 % (ref 14.0–49.0)
MCH: 32.5 pg (ref 27.2–33.4)
MCHC: 34.9 g/dL (ref 32.0–36.0)
MCV: 93 fL (ref 79.3–98.0)
MONO#: 0.7 10*3/uL (ref 0.1–0.9)
MONO%: 9.6 % (ref 0.0–14.0)
NEUT%: 45 % (ref 39.0–75.0)
NEUTROS ABS: 3.3 10*3/uL (ref 1.5–6.5)
PLATELETS: 158 10*3/uL (ref 140–400)
RBC: 3.8 10*6/uL — ABNORMAL LOW (ref 4.20–5.82)
RDW: 14.6 % (ref 11.0–14.6)
WBC: 7.4 10*3/uL (ref 4.0–10.3)

## 2017-05-14 NOTE — Telephone Encounter (Signed)
-----   Message from Villamor Portela, MD sent at 05/14/2017  8:46 AM EDT ----- Please let him know his hgb is normal . No changes

## 2017-05-14 NOTE — Telephone Encounter (Signed)
Per Dr. Alen Blew, I informed patient's wife of his HGB level and there are no changes at this time. Wife verbalized understanding.

## 2017-05-26 ENCOUNTER — Other Ambulatory Visit: Payer: Self-pay | Admitting: Oncology

## 2017-05-28 ENCOUNTER — Encounter: Payer: Self-pay | Admitting: *Deleted

## 2017-05-28 ENCOUNTER — Other Ambulatory Visit (HOSPITAL_BASED_OUTPATIENT_CLINIC_OR_DEPARTMENT_OTHER): Payer: Medicare Other

## 2017-05-28 ENCOUNTER — Other Ambulatory Visit: Payer: Self-pay | Admitting: *Deleted

## 2017-05-28 DIAGNOSIS — C9110 Chronic lymphocytic leukemia of B-cell type not having achieved remission: Secondary | ICD-10-CM

## 2017-05-28 DIAGNOSIS — D599 Acquired hemolytic anemia, unspecified: Secondary | ICD-10-CM

## 2017-05-28 DIAGNOSIS — R590 Localized enlarged lymph nodes: Secondary | ICD-10-CM

## 2017-05-28 LAB — CBC WITH DIFFERENTIAL/PLATELET
BASO%: 1.5 % (ref 0.0–2.0)
BASOS ABS: 0.1 10*3/uL (ref 0.0–0.1)
EOS%: 2.9 % (ref 0.0–7.0)
Eosinophils Absolute: 0.3 10*3/uL (ref 0.0–0.5)
HCT: 30.6 % — ABNORMAL LOW (ref 38.4–49.9)
HGB: 10.7 g/dL — ABNORMAL LOW (ref 13.0–17.1)
LYMPH%: 34.1 % (ref 14.0–49.0)
MCH: 32 pg (ref 27.2–33.4)
MCHC: 34.8 g/dL (ref 32.0–36.0)
MCV: 91.7 fL (ref 79.3–98.0)
MONO#: 0.8 10*3/uL (ref 0.1–0.9)
MONO%: 8.8 % (ref 0.0–14.0)
NEUT#: 4.6 10*3/uL (ref 1.5–6.5)
NEUT%: 52.7 % (ref 39.0–75.0)
Platelets: 160 10*3/uL (ref 140–400)
RBC: 3.34 10*6/uL — AB (ref 4.20–5.82)
RDW: 15.1 % — ABNORMAL HIGH (ref 11.0–14.6)
WBC: 8.8 10*3/uL (ref 4.0–10.3)
lymph#: 3 10*3/uL (ref 0.9–3.3)

## 2017-05-28 LAB — COMPREHENSIVE METABOLIC PANEL
ALT: 18 U/L (ref 0–55)
ANION GAP: 11 meq/L (ref 3–11)
AST: 22 U/L (ref 5–34)
Albumin: 3.9 g/dL (ref 3.5–5.0)
Alkaline Phosphatase: 75 U/L (ref 40–150)
BILIRUBIN TOTAL: 2.16 mg/dL — AB (ref 0.20–1.20)
BUN: 19.3 mg/dL (ref 7.0–26.0)
CO2: 23 meq/L (ref 22–29)
CREATININE: 1.2 mg/dL (ref 0.7–1.3)
Calcium: 9.4 mg/dL (ref 8.4–10.4)
Chloride: 109 mEq/L (ref 98–109)
EGFR: 58 mL/min/{1.73_m2} — ABNORMAL LOW (ref >90)
Glucose: 216 mg/dl — ABNORMAL HIGH (ref 70–140)
Potassium: 4.2 mEq/L (ref 3.5–5.1)
Sodium: 142 mEq/L (ref 136–145)
TOTAL PROTEIN: 7.5 g/dL (ref 6.4–8.3)

## 2017-05-28 LAB — TECHNOLOGIST REVIEW

## 2017-06-25 ENCOUNTER — Ambulatory Visit (HOSPITAL_BASED_OUTPATIENT_CLINIC_OR_DEPARTMENT_OTHER): Payer: Medicare Other | Admitting: Oncology

## 2017-06-25 ENCOUNTER — Other Ambulatory Visit: Payer: Self-pay | Admitting: Oncology

## 2017-06-25 ENCOUNTER — Other Ambulatory Visit (HOSPITAL_BASED_OUTPATIENT_CLINIC_OR_DEPARTMENT_OTHER): Payer: Medicare Other

## 2017-06-25 ENCOUNTER — Telehealth: Payer: Self-pay | Admitting: Oncology

## 2017-06-25 VITALS — BP 133/58 | HR 64 | Temp 98.3°F | Resp 18 | Ht 72.0 in | Wt 192.6 lb

## 2017-06-25 DIAGNOSIS — E538 Deficiency of other specified B group vitamins: Secondary | ICD-10-CM

## 2017-06-25 DIAGNOSIS — C9110 Chronic lymphocytic leukemia of B-cell type not having achieved remission: Secondary | ICD-10-CM

## 2017-06-25 DIAGNOSIS — D599 Acquired hemolytic anemia, unspecified: Secondary | ICD-10-CM

## 2017-06-25 DIAGNOSIS — R739 Hyperglycemia, unspecified: Secondary | ICD-10-CM

## 2017-06-25 DIAGNOSIS — D72828 Other elevated white blood cell count: Secondary | ICD-10-CM

## 2017-06-25 DIAGNOSIS — C9190 Lymphoid leukemia, unspecified not having achieved remission: Secondary | ICD-10-CM

## 2017-06-25 DIAGNOSIS — R161 Splenomegaly, not elsewhere classified: Secondary | ICD-10-CM | POA: Diagnosis not present

## 2017-06-25 LAB — CBC & DIFF AND RETIC
BASO%: 0.3 % (ref 0.0–2.0)
BASOS ABS: 0 10*3/uL (ref 0.0–0.1)
EOS%: 3.5 % (ref 0.0–7.0)
Eosinophils Absolute: 0.3 10*3/uL (ref 0.0–0.5)
HEMATOCRIT: 30.4 % — AB (ref 38.4–49.9)
HEMOGLOBIN: 10.3 g/dL — AB (ref 13.0–17.1)
LYMPH%: 44.6 % (ref 14.0–49.0)
MCH: 34.2 pg — AB (ref 27.2–33.4)
MCHC: 33.9 g/dL (ref 32.0–36.0)
MCV: 101 fL — AB (ref 79.3–98.0)
MONO#: 0.8 10*3/uL (ref 0.1–0.9)
MONO%: 9.4 % (ref 0.0–14.0)
NEUT#: 3.7 10*3/uL (ref 1.5–6.5)
NEUT%: 42.2 % (ref 39.0–75.0)
PLATELETS: 152 10*3/uL (ref 140–400)
RBC: 3.01 10*6/uL — ABNORMAL LOW (ref 4.20–5.82)
RDW: 17.1 % — ABNORMAL HIGH (ref 11.0–14.6)
WBC: 8.8 10*3/uL (ref 4.0–10.3)
lymph#: 3.9 10*3/uL — ABNORMAL HIGH (ref 0.9–3.3)
nRBC: 0 % (ref 0–0)

## 2017-06-25 LAB — COMPREHENSIVE METABOLIC PANEL
ALBUMIN: 4.2 g/dL (ref 3.5–5.0)
ALK PHOS: 72 U/L (ref 40–150)
ALT: 16 U/L (ref 0–55)
ANION GAP: 9 meq/L (ref 3–11)
AST: 26 U/L (ref 5–34)
BILIRUBIN TOTAL: 3 mg/dL — AB (ref 0.20–1.20)
BUN: 24.4 mg/dL (ref 7.0–26.0)
CO2: 23 mEq/L (ref 22–29)
Calcium: 9.4 mg/dL (ref 8.4–10.4)
Chloride: 109 mEq/L (ref 98–109)
Creatinine: 1.1 mg/dL (ref 0.7–1.3)
EGFR: 65 mL/min/{1.73_m2} — AB (ref >90)
GLUCOSE: 172 mg/dL — AB (ref 70–140)
POTASSIUM: 4.1 meq/L (ref 3.5–5.1)
Sodium: 140 mEq/L (ref 136–145)
Total Protein: 7.9 g/dL (ref 6.4–8.3)

## 2017-06-25 LAB — IRON AND TIBC
%SAT: 57 % — ABNORMAL HIGH (ref 20–55)
IRON: 137 ug/dL (ref 42–163)
TIBC: 239 ug/dL (ref 202–409)
UIBC: 102 ug/dL — ABNORMAL LOW (ref 117–376)

## 2017-06-25 LAB — FERRITIN: FERRITIN: 564 ng/mL — AB (ref 22–316)

## 2017-06-25 LAB — LACTATE DEHYDROGENASE: LDH: 462 U/L — AB (ref 125–245)

## 2017-06-25 NOTE — Progress Notes (Signed)
Hematology and Oncology Follow Up Visit  Richard Evans [id number] 12/16/1945 71 y.o. 06/25/2017 8:36 AM Richard Evans, MDVyas, Richard Hatcher, MD   Principle Diagnosis: 71 year old gentleman with autoimmune hemolytic anemia diagnosed on July 30 of 2017. He presented with hemoglobin of 7.5, MCV 106 with elevated reticulocyte count and haptoglobin less than 10. Coombs testing was positive. CT scan of the chest abdomen and pelvis showed nonspecific hilar adenopathy. Bone marrow biopsy obtained on 11/14/2016 confirmed the diagnosis of CLL.   Prior Therapy:   Status post packed red cell transfusion for 1 unit on 07/04/2016. Prednisone 70 mg daily started on 06/30/2016. He achieved complete response and prednisone tapered completely off on 09/11/2016. He relapsed quickly with worsening anemia. Prednisone 90 mg daily started on 09/30/2016. He achieved complete response on 10/17/2016. He completed prednisone taper on 12/18/2016.  Current therapy: Observation and surveillance.   Interim History:  Richard Evans presents today for a follow-up visit. Since the last visit, he reports feeling reasonably well. He just returned from vacation after spending close to month on the beach. He is reporting some mild fatigue after long exertion. He remains active and continues to work outside his house.Marland Kitchen He denied any recent bleeding complications. He denied any lymphadenopathy, petechiae or any other constitutional symptoms. He denied any discoloration of his urine or jaundice. He denied any pruritus or constitutional symptoms.   He does not report any headaches, blurry vision, syncope or seizures. He is not report any fevers or chills or weight loss. He does not report any chest pain, palpitation, orthopnea or leg edema. He does not report any cough, wheezing or hemoptysis. He does not report any nausea, vomiting or abdominal pain. He does not report any frequency urgency or hesitancy. He has not reported any skeletal complaints.  Remaining review of systems unremarkable   Medications: I have reviewed the patient's current medications.  Current Outpatient Prescriptions  Medication Sig Dispense Refill  . atenolol (TENORMIN) 25 MG tablet Take 25 mg by mouth at bedtime.     . diphenhydrAMINE (BENADRYL) 25 mg capsule Take 25 mg by mouth every 6 (six) hours as needed.    . folic acid (FOLVITE) 1 MG tablet TAKE 1 TABLET BY MOUTH EVERY DAY 30 tablet 0  . glipiZIDE (GLUCOTROL XL) 5 MG 24 hr tablet Take 5 mg by mouth daily before breakfast.     . metoprolol succinate (TOPROL-XL) 25 MG 24 hr tablet Take 25 mg by mouth daily. Pharmacy unable to get atenolol, taking this for now    . polyethylene glycol powder (CLEARLAX) powder Take 17 g by mouth daily as needed (constipation).    . Tamsulosin HCl (FLOMAX) 0.4 MG CAPS Take 0.4 mg by mouth at bedtime.      No current facility-administered medications for this visit.      Allergies:  Allergies  Allergen Reactions  . Adhesive [Tape] Itching and Rash    Paper tape or wrapped bandage is preferred    Past Medical History, Surgical history, Social history, and Family History were reviewed and updated.  Physical Exam: Blood pressure (!) 133/58, pulse 64, temperature 98.3 F (36.8 C), temperature source Oral, resp. rate 18, height 6' (1.829 m), weight 192 lb 9.6 oz (87.4 kg), SpO2 100 %. ECOG: 0 General appearance: Well-appearing gentleman without distress. Head: Normocephalic, without obvious abnormality. No oral ulcers or lesions. Neck: no adenopathy Lymph nodes: Cervical, supraclavicular, and axillary nodes normal. Heart:regular rate and rhythm, S1, S2 normal, no murmur, click, rub or  gallop Lung:chest clear, no wheezing, rales, normal symmetric air entry.  Abdomin: Soft, nontender without rebound or guarding. No rebound or guarding. EXT: No edema or erythema.   Lab Results: Lab Results  Component Value Date   WBC 8.8 06/25/2017   HGB 10.3 (L) 06/25/2017   HCT 30.4  (L) 06/25/2017   MCV 101.0 (H) 06/25/2017   PLT 152 06/25/2017     Chemistry      Component Value Date/Time   NA 142 05/28/2017 0740   K 4.2 05/28/2017 0740   CL 106 11/14/2016 0723   CO2 23 05/28/2017 0740   BUN 19.3 05/28/2017 0740   CREATININE 1.2 05/28/2017 0740      Component Value Date/Time   CALCIUM 9.4 05/28/2017 0740   ALKPHOS 75 05/28/2017 0740   AST 22 05/28/2017 0740   ALT 18 05/28/2017 0740   BILITOT 2.16 (H) 05/28/2017 0740      GROSS AND MICROSCOPIC INFORMATION  Impression and Plan:   71 year old gentleman with the following issues:  1. Autoimmune hemolytic anemia presented with hemoglobin of 7.5 and positive Coombs testing. He was diagnosed In 06/30/2016.  He achieved complete response after initial prednisone therapy but relapsed after prednisone taper.  I recommended treatment with rituximab but he declined that option At that time.  He was started on prednisone 90 mg daily on 09/30/2016. He completed a steroid taper in January 2018 after achieving complete response.  His hemoglobin appears to be declining again with increased RDW. The differential diagnosis was discussed today with the patient and his wife. The likely scenario is that he is experiencing active hemolysis again but other considerations such as iron deficiency and B12 could also be contributing. I have repeated his laboratory data today including reticulocyte count, LDH, B12 level as well as iron studies.  if deficiency as noted, then we will replace those items appropriately. If we're dealing with active hemolysis, he will require rituximab treatment. Risks and benefits associated with this treatment will reviewed again. Complications include nausea, fatigue as well as infusion related complications. These would include hives, pruritus and rarely anaphylaxis. He will consider this option as he remain hesitant to receive this medication. I explained to him as he has active hemolysis his best  choice would be rituximab for splenectomy if he continues to decline rituximab.  2. CLL: Diagnosed in December 2014. It is likely the etiology of his autoimmune hemolytic anemia. He presented with very little to no adenopathy and mild splenomegaly.  Active hemolysis might be an indication to treat his CLL. For the time being I'm in favor of using rituximab alone rather than rituximab and chemotherapy.  3. Hyperglycemia: Managed by his primary care physician currently on oral hypoglycemic agent.  4. Splenomegaly: Related to CLL and not dramatically changed.  5. Follow-up: He will continue to follow with weekly labs and M.D. follow-up in 4 weeks.   [facility], MD 7/25/20188:36 AM

## 2017-06-25 NOTE — Telephone Encounter (Signed)
Scheduled appt per 7/25 los - Gave patient AVS and calender per los.

## 2017-06-26 LAB — RETICULOCYTES: RETICULOCYTE COUNT: 16.2 % — AB (ref 0.6–2.6)

## 2017-06-26 LAB — VITAMIN B12: VITAMIN B 12: 331 pg/mL (ref 232–1245)

## 2017-06-26 LAB — HAPTOGLOBIN

## 2017-07-01 ENCOUNTER — Other Ambulatory Visit: Payer: Self-pay | Admitting: *Deleted

## 2017-07-01 DIAGNOSIS — D649 Anemia, unspecified: Secondary | ICD-10-CM

## 2017-07-02 ENCOUNTER — Other Ambulatory Visit (HOSPITAL_BASED_OUTPATIENT_CLINIC_OR_DEPARTMENT_OTHER): Payer: Medicare Other

## 2017-07-02 ENCOUNTER — Telehealth: Payer: Self-pay | Admitting: *Deleted

## 2017-07-02 DIAGNOSIS — D649 Anemia, unspecified: Secondary | ICD-10-CM | POA: Diagnosis present

## 2017-07-02 LAB — CBC WITH DIFFERENTIAL/PLATELET
BASO%: 0.3 % (ref 0.0–2.0)
BASOS ABS: 0 10*3/uL (ref 0.0–0.1)
EOS ABS: 0.2 10*3/uL (ref 0.0–0.5)
EOS%: 2.3 % (ref 0.0–7.0)
HEMATOCRIT: 28.9 % — AB (ref 38.4–49.9)
HEMOGLOBIN: 10 g/dL — AB (ref 13.0–17.1)
LYMPH#: 3.5 10*3/uL — AB (ref 0.9–3.3)
LYMPH%: 44.5 % (ref 14.0–49.0)
MCH: 36.4 pg — AB (ref 27.2–33.4)
MCHC: 34.6 g/dL (ref 32.0–36.0)
MCV: 105.1 fL — ABNORMAL HIGH (ref 79.3–98.0)
MONO#: 0.8 10*3/uL (ref 0.1–0.9)
MONO%: 9.7 % (ref 0.0–14.0)
NEUT#: 3.4 10*3/uL (ref 1.5–6.5)
NEUT%: 43.2 % (ref 39.0–75.0)
Platelets: 141 10*3/uL (ref 140–400)
RBC: 2.75 10*6/uL — ABNORMAL LOW (ref 4.20–5.82)
RDW: 19.4 % — AB (ref 11.0–14.6)
WBC: 7.9 10*3/uL (ref 4.0–10.3)

## 2017-07-02 LAB — TECHNOLOGIST REVIEW

## 2017-07-02 NOTE — Telephone Encounter (Signed)
-----   Message from Banbury Portela, MD sent at 07/02/2017  9:09 AM EDT ----- Please let him know his hgb is not changed much.

## 2017-07-02 NOTE — Telephone Encounter (Signed)
As noted below by Dr. Alen Blew, I informed patient of his hemoglobin level. Patient verbalized understanding.

## 2017-07-09 ENCOUNTER — Other Ambulatory Visit (HOSPITAL_BASED_OUTPATIENT_CLINIC_OR_DEPARTMENT_OTHER): Payer: Medicare Other

## 2017-07-09 ENCOUNTER — Telehealth: Payer: Self-pay | Admitting: *Deleted

## 2017-07-09 DIAGNOSIS — D649 Anemia, unspecified: Secondary | ICD-10-CM | POA: Diagnosis present

## 2017-07-09 LAB — CBC WITH DIFFERENTIAL/PLATELET
BASO%: 0.6 % (ref 0.0–2.0)
Basophils Absolute: 0 10*3/uL (ref 0.0–0.1)
EOS ABS: 0.2 10*3/uL (ref 0.0–0.5)
EOS%: 2.3 % (ref 0.0–7.0)
HEMATOCRIT: 28.6 % — AB (ref 38.4–49.9)
HGB: 10 g/dL — ABNORMAL LOW (ref 13.0–17.1)
LYMPH#: 3.1 10*3/uL (ref 0.9–3.3)
LYMPH%: 39.9 % (ref 14.0–49.0)
MCH: 37.3 pg — ABNORMAL HIGH (ref 27.2–33.4)
MCHC: 34.8 g/dL (ref 32.0–36.0)
MCV: 107.2 fL — AB (ref 79.3–98.0)
MONO#: 0.6 10*3/uL (ref 0.1–0.9)
MONO%: 7.9 % (ref 0.0–14.0)
NEUT#: 3.9 10*3/uL (ref 1.5–6.5)
NEUT%: 49.3 % (ref 39.0–75.0)
PLATELETS: 146 10*3/uL (ref 140–400)
RBC: 2.67 10*6/uL — ABNORMAL LOW (ref 4.20–5.82)
RDW: 16.6 % — ABNORMAL HIGH (ref 11.0–14.6)
WBC: 7.8 10*3/uL (ref 4.0–10.3)

## 2017-07-09 NOTE — Telephone Encounter (Signed)
-----   Message from Loney Portela, MD sent at 07/09/2017  9:22 AM EDT ----- Please let him know his hgb unchanged.

## 2017-07-09 NOTE — Telephone Encounter (Signed)
As noted below by Dr. Alen Blew, I informed patient's wife that his hemoglobin is 10.0. She verbalized understanding.

## 2017-07-15 DIAGNOSIS — D589 Hereditary hemolytic anemia, unspecified: Secondary | ICD-10-CM | POA: Diagnosis not present

## 2017-07-15 DIAGNOSIS — J209 Acute bronchitis, unspecified: Secondary | ICD-10-CM | POA: Diagnosis not present

## 2017-07-15 DIAGNOSIS — Z299 Encounter for prophylactic measures, unspecified: Secondary | ICD-10-CM | POA: Diagnosis not present

## 2017-07-15 DIAGNOSIS — Z6827 Body mass index (BMI) 27.0-27.9, adult: Secondary | ICD-10-CM | POA: Diagnosis not present

## 2017-07-15 DIAGNOSIS — C9190 Lymphoid leukemia, unspecified not having achieved remission: Secondary | ICD-10-CM | POA: Diagnosis not present

## 2017-07-15 DIAGNOSIS — I10 Essential (primary) hypertension: Secondary | ICD-10-CM | POA: Diagnosis not present

## 2017-07-16 ENCOUNTER — Encounter: Payer: Self-pay | Admitting: *Deleted

## 2017-07-16 ENCOUNTER — Other Ambulatory Visit (HOSPITAL_BASED_OUTPATIENT_CLINIC_OR_DEPARTMENT_OTHER): Payer: Medicare Other

## 2017-07-16 ENCOUNTER — Ambulatory Visit: Payer: Medicare Other

## 2017-07-16 ENCOUNTER — Ambulatory Visit (HOSPITAL_COMMUNITY)
Admission: RE | Admit: 2017-07-16 | Discharge: 2017-07-16 | Disposition: A | Discharge status: Home / Self Care | Payer: Medicare Other | Source: Ambulatory Visit | Attending: Oncology | Admitting: Oncology

## 2017-07-16 ENCOUNTER — Other Ambulatory Visit: Payer: Self-pay | Admitting: *Deleted

## 2017-07-16 ENCOUNTER — Telehealth: Payer: Self-pay | Admitting: *Deleted

## 2017-07-16 DIAGNOSIS — R590 Localized enlarged lymph nodes: Secondary | ICD-10-CM

## 2017-07-16 DIAGNOSIS — R5383 Other fatigue: Secondary | ICD-10-CM | POA: Diagnosis not present

## 2017-07-16 DIAGNOSIS — D649 Anemia, unspecified: Secondary | ICD-10-CM | POA: Insufficient documentation

## 2017-07-16 LAB — CBC WITH DIFFERENTIAL/PLATELET
BASO%: 0.7 % (ref 0.0–2.0)
Basophils Absolute: 0.1 10*3/uL (ref 0.0–0.1)
EOS ABS: 0.2 10*3/uL (ref 0.0–0.5)
EOS%: 2.5 % (ref 0.0–7.0)
HCT: 24 % — ABNORMAL LOW (ref 38.4–49.9)
HGB: 8.3 g/dL — ABNORMAL LOW (ref 13.0–17.1)
LYMPH%: 35.5 % (ref 14.0–49.0)
MCH: 37.4 pg — ABNORMAL HIGH (ref 27.2–33.4)
MCHC: 34.6 g/dL (ref 32.0–36.0)
MCV: 108 fL — AB (ref 79.3–98.0)
MONO#: 0.5 10*3/uL (ref 0.1–0.9)
MONO%: 6.3 % (ref 0.0–14.0)
NEUT#: 4.6 10*3/uL (ref 1.5–6.5)
NEUT%: 55 % (ref 39.0–75.0)
PLATELETS: 152 10*3/uL (ref 140–400)
RBC: 2.22 10*6/uL — AB (ref 4.20–5.82)
RDW: 17.4 % — ABNORMAL HIGH (ref 11.0–14.6)
WBC: 8.4 10*3/uL (ref 4.0–10.3)
lymph#: 3 10*3/uL (ref 0.9–3.3)

## 2017-07-16 LAB — PREPARE RBC (CROSSMATCH)

## 2017-07-16 LAB — TECHNOLOGIST REVIEW

## 2017-07-16 NOTE — Telephone Encounter (Signed)
Per tiffany in patient care center, Patient is to receive 1 unit p-rbc's 04/17/17 in patient care center. Arrive at 12:00 for iv start and premeds to be given. Patient notified.

## 2017-07-16 NOTE — Progress Notes (Unsigned)
Patient presents with SOB on exertion. Per dr Alen Blew 2 units p-rbc's ordered. Type and cross today. Receive 2 units p-rbc's at [facility] tomorrow @ 10:00. Patient given written instructions. Lab appt made for today.

## 2017-07-17 ENCOUNTER — Encounter (HOSPITAL_COMMUNITY): Payer: Medicare Other

## 2017-07-18 ENCOUNTER — Ambulatory Visit (HOSPITAL_COMMUNITY)
Admission: RE | Admit: 2017-07-18 | Discharge: 2017-07-18 | Disposition: A | Discharge status: Home / Self Care | Payer: Medicare Other | Source: Ambulatory Visit | Attending: Oncology | Admitting: Oncology

## 2017-07-18 ENCOUNTER — Encounter (HOSPITAL_COMMUNITY): Payer: Medicare Other

## 2017-07-18 DIAGNOSIS — R590 Localized enlarged lymph nodes: Secondary | ICD-10-CM

## 2017-07-18 DIAGNOSIS — D649 Anemia, unspecified: Secondary | ICD-10-CM | POA: Diagnosis not present

## 2017-07-18 MED ORDER — HEPARIN SOD (PORK) LOCK FLUSH 100 UNIT/ML IV SOLN: 500.0000 [IU] | Freq: Every day | INTRAVENOUS | Status: DC | PRN

## 2017-07-18 MED ORDER — DIPHENHYDRAMINE HCL 25 MG PO CAPS
25.0000 mg | ORAL_CAPSULE | Freq: Once | ORAL | Status: AC
  Administered 2017-07-18: 25 mg via ORAL
  Filled 2017-07-18: qty 1

## 2017-07-18 MED ORDER — SODIUM CHLORIDE 0.9 % IV SOLN
250.0000 mL | Freq: Once | INTRAVENOUS | Status: AC
  Administered 2017-07-18: 250 mL via INTRAVENOUS

## 2017-07-18 MED ORDER — SODIUM CHLORIDE 0.9% FLUSH
10.0000 mL | INTRAVENOUS | Status: DC | PRN
Start: 2017-07-18 — End: 2017-07-19

## 2017-07-18 MED ORDER — ACETAMINOPHEN 325 MG PO TABS
650.0000 mg | ORAL_TABLET | Freq: Once | ORAL | Status: AC
  Administered 2017-07-18: 650 mg via ORAL
  Filled 2017-07-18: qty 2

## 2017-07-18 NOTE — Progress Notes (Signed)
Procedure: Transfusion of 1 unit PRBC  Ordering Provider: Dr. Dahlia Byes  Diagnosis: Mediastinal lymphadenopathy (R59.0)  Pt arrived to receive with order for 2 units PRBCs; per blood bank, only 1 unit available for infusion today; infusion completed with no complications noted; family accompanied patient; pt discharged home with no complications noted

## 2017-07-18 NOTE — Progress Notes (Signed)
Dr. Alen Blew was notified that this patient has emergent blood. Order was given to go ahead and give it now.

## 2017-07-18 NOTE — Discharge Instructions (Signed)

## 2017-07-19 LAB — TYPE AND SCREEN
ABO/RH(D): O NEG
Antibody Screen: POSITIVE
DAT, IgG: POSITIVE
Unit division: 0

## 2017-07-19 LAB — BPAM RBC
Blood Product Expiration Date: 201808180003
ISSUE DATE / TIME: 201808171208
UNIT TYPE AND RH: 9500

## 2017-07-21 ENCOUNTER — Telehealth: Payer: Self-pay

## 2017-07-21 DIAGNOSIS — D589 Hereditary hemolytic anemia, unspecified: Secondary | ICD-10-CM | POA: Diagnosis not present

## 2017-07-21 DIAGNOSIS — K219 Gastro-esophageal reflux disease without esophagitis: Secondary | ICD-10-CM | POA: Diagnosis not present

## 2017-07-21 DIAGNOSIS — Z299 Encounter for prophylactic measures, unspecified: Secondary | ICD-10-CM | POA: Diagnosis not present

## 2017-07-21 DIAGNOSIS — I10 Essential (primary) hypertension: Secondary | ICD-10-CM | POA: Diagnosis not present

## 2017-07-21 DIAGNOSIS — Z6826 Body mass index (BMI) 26.0-26.9, adult: Secondary | ICD-10-CM | POA: Diagnosis not present

## 2017-07-21 NOTE — Telephone Encounter (Signed)
Pt received blood on Friday. He is feeling weaker, he did not perk up much after blood on Friday. They were able to give him one unit and not 2 d/t crossmatch issues. Emergent blood was used. He is very SOB, he rides a scooter at the stores, he cannot drive.  His heart pounds with walking. He feels he cannot wait until Wednesday's appt with Dr Donata Duff about an hour to get to [facility] if needs to be seen or have labs d/t live in [place]

## 2017-07-21 NOTE — Telephone Encounter (Signed)
This nurse called and spoke to patient regarding issues noted below. Patient stated,"I'm short of breath when I walk short distances, which is not to far, and my heart is pounding in my chest and throat." I instructed patient to go to [facility] to be evaluated. Do not drive to [facility] because it's an hour away and Dr. Alen Blew is not here today. Patient stated,"I will call my family doctor."

## 2017-07-23 ENCOUNTER — Telehealth: Payer: Self-pay | Admitting: Oncology

## 2017-07-23 ENCOUNTER — Encounter: Payer: Self-pay | Admitting: *Deleted

## 2017-07-23 ENCOUNTER — Ambulatory Visit (HOSPITAL_BASED_OUTPATIENT_CLINIC_OR_DEPARTMENT_OTHER): Payer: Medicare Other | Admitting: Oncology

## 2017-07-23 ENCOUNTER — Other Ambulatory Visit (HOSPITAL_BASED_OUTPATIENT_CLINIC_OR_DEPARTMENT_OTHER): Payer: Medicare Other

## 2017-07-23 VITALS — BP 165/66 | HR 77 | Temp 97.9°F | Resp 18 | Ht 72.0 in | Wt 191.2 lb

## 2017-07-23 DIAGNOSIS — D649 Anemia, unspecified: Secondary | ICD-10-CM

## 2017-07-23 DIAGNOSIS — D599 Acquired hemolytic anemia, unspecified: Secondary | ICD-10-CM | POA: Diagnosis not present

## 2017-07-23 DIAGNOSIS — C9110 Chronic lymphocytic leukemia of B-cell type not having achieved remission: Secondary | ICD-10-CM | POA: Diagnosis not present

## 2017-07-23 LAB — CBC WITH DIFFERENTIAL/PLATELET
BASO%: 0.3 % (ref 0.0–2.0)
Basophils Absolute: 0 10*3/uL (ref 0.0–0.1)
EOS%: 1.9 % (ref 0.0–7.0)
Eosinophils Absolute: 0.2 10*3/uL (ref 0.0–0.5)
HCT: 25 % — ABNORMAL LOW (ref 38.4–49.9)
HGB: 9 g/dL — ABNORMAL LOW (ref 13.0–17.1)
LYMPH%: 39.6 % (ref 14.0–49.0)
MCH: 42.3 pg — ABNORMAL HIGH (ref 27.2–33.4)
MCHC: 35.9 g/dL (ref 32.0–36.0)
MCV: 117.8 fL — ABNORMAL HIGH (ref 79.3–98.0)
MONO#: 0.5 10*3/uL (ref 0.1–0.9)
MONO%: 5.6 % (ref 0.0–14.0)
NEUT%: 52.6 % (ref 39.0–75.0)
NEUTROS ABS: 4.8 10*3/uL (ref 1.5–6.5)
Platelets: 131 10*3/uL — ABNORMAL LOW (ref 140–400)
RBC: 2.12 10*6/uL — AB (ref 4.20–5.82)
RDW: 22.8 % — ABNORMAL HIGH (ref 11.0–14.6)
WBC: 9.1 10*3/uL (ref 4.0–10.3)
lymph#: 3.6 10*3/uL — ABNORMAL HIGH (ref 0.9–3.3)

## 2017-07-23 NOTE — Telephone Encounter (Signed)
Scheduled appt pe r8/22 los - Gave patient AVS and calender per los.  

## 2017-07-23 NOTE — Progress Notes (Signed)
Hematology and Oncology Follow Up Visit  Richard Evans [id number] 1946-01-01 71 y.o. 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 He presented with hemoglobin of 7.5, MCV 106 with elevated reticulocyte count and haptoglobin less than 10. Coombs testing was positive. CT scan of the chest abdomen and pelvis showed nonspecific hilar adenopathy. Bone marrow biopsy obtained on 11/14/2016 confirmed the diagnosis of CLL.   Prior Therapy:   Status post packed red cell transfusion for 1 unit on 07/04/2016. Prednisone 70 mg daily started on 06/30/2016. He achieved complete response and prednisone tapered completely off on 09/11/2016. He relapsed quickly with worsening anemia. Prednisone 90 mg daily started on 09/30/2016. He achieved complete response on 10/17/2016. He completed prednisone taper on 12/18/2016.  Current therapy: Observation and surveillance.   Interim History:  Richard Evans presents today for a follow-up visit. Since the last visit, he reports feeling better this week as an last week. He started experiencing symptoms of excessive fatigue and tiredness and generally feeling poorly. His hemoglobin on 07/16/2017 was 8.3 and a transfusion was set up without a week. She did receive palliative 1 unit of packed red cells that has to be ordered to especially given his alloantibodies. After the transfusion he didn't feel any different. Last few days he has reported slightly better and have resumed some activities of daily living. He denied any fevers or chills or sweats. He denied any lymphadenopathy.  His appetite has not changed at this time although he does report some abdominal bloating periodically. He denied any nausea or vomiting or diarrhea.  He does not report any headaches, blurry vision, syncope or seizures. He is not report any fevers or chills or weight loss. He does not  report any chest pain, palpitation, orthopnea or leg edema. He does not report any cough, wheezing or hemoptysis. He does not report any nausea, vomiting or abdominal pain. He does not report any frequency urgency or hesitancy. He has not reported any skeletal complaints. Remaining review of systems unremarkable   Medications: I have reviewed the patient's current medications.  Current Outpatient Prescriptions  Medication Sig Dispense Refill  . atenolol (TENORMIN) 25 MG tablet Take 25 mg by mouth at bedtime.     . diphenhydrAMINE (BENADRYL) 25 mg capsule Take 25 mg by mouth every 6 (six) hours as needed.    . folic acid (FOLVITE) 1 MG tablet TAKE 1 TABLET BY MOUTH EVERY DAY 30 tablet 0  . glipiZIDE (GLUCOTROL XL) 5 MG 24 hr tablet Take 5 mg by mouth daily before breakfast.     . metoprolol succinate (TOPROL-XL) 25 MG 24 hr tablet Take 25 mg by mouth daily. Pharmacy unable to get atenolol, taking this for now    . polyethylene glycol powder (CLEARLAX) powder Take 17 g by mouth daily as needed (constipation).    . Tamsulosin HCl (FLOMAX) 0.4 MG CAPS Take 0.4 mg by mouth at bedtime.      No current facility-administered medications for this visit.      Allergies:  Allergies  Allergen Reactions  . Adhesive [Tape] Itching and Rash    Paper tape or wrapped bandage is preferred    Past Medical History, Surgical history, Social history, and Family History were reviewed and updated.  Physical Exam: Blood pressure (!) 165/66, pulse 77, temperature 97.9 F (36.6 C), temperature source Oral, resp. rate 18, height 6' (1.829 m), weight 191  lb 3.2 oz (86.7 kg), SpO2 100 %. ECOG: 0 General appearance: Alert, awake gentleman appeared without distress. Head: Normocephalic, without obvious abnormality. No oral thrush or ulcers. Neck: no adenopathy Lymph nodes: Cervical, supraclavicular, and axillary nodes normal. Heart:regular rate and rhythm, S1, S2 normal, no murmur, click, rub or  gallop Lung:chest clear, no wheezing, rales, normal symmetric air entry.  Abdomin: Soft, nontender without rebound or guarding. No shifting dullness or ascites. Slightly distended. EXT: No edema or erythema.   Lab Results: Lab Results  Component Value Date   WBC 9.1 07/23/2017   HGB 9.0 (L) 07/23/2017   HCT 25.0 (L) 07/23/2017   MCV 117.8 (H) 07/23/2017   PLT 131 (L) 07/23/2017     Chemistry      Component Value Date/Time   NA 140 06/25/2017 0736   K 4.1 06/25/2017 0736   CL 106 11/14/2016 0723   CO2 23 06/25/2017 0736   BUN 24.4 06/25/2017 0736   CREATININE 1.1 06/25/2017 0736      Component Value Date/Time   CALCIUM 9.4 06/25/2017 0736   ALKPHOS 72 06/25/2017 0736   AST 26 06/25/2017 0736   ALT 16 06/25/2017 0736   BILITOT 3.00 (H) 06/25/2017 0736      GROSS AND MICROSCOPIC INFORMATION  Impression and Plan:   71 year old gentleman with the following issues:  1. Autoimmune hemolytic anemia presented with hemoglobin of 7.5 and positive Coombs testing. He was diagnosed In 06/30/2016.  He achieved complete response after initial prednisone therapy but relapsed after prednisone taper.  I recommended treatment with rituximab but he declined that option At that time.  He was started on prednisone 90 mg daily on 09/30/2016. He completed a steroid taper in January 2018 after achieving complete response.  His hemoglobin appears to be declining again with increased MCV. His LDH on 06/25/2017 was elevated at 462. His iron and B12 levels were within normal range. His haptoglobin is low and his DAT remains positive.  His hemolysis appears to be active again based on the drop in his hemoglobin and the workup obtained last month.   Risks and benefits associated with rituximab treatment will reviewed again. Complications include nausea, fatigue as well as infusion related complications. These would include hives, pruritus and rarely anaphylaxis. She continues to be hesitant  about this treatment and his family is apprehensive about him receiving it. At this point, I feel there is no other way to improve his hemolysis and treat his underlying CLL. Splenectomy option was also offered but felt that would be excessive at this time.  After discussion today, he is agreeable to start rituximab weekly at 375 mg for 4 weeks. I anticipate starting on 08/07/2017. He said he might change his mind and his hemoglobin is climbing up again when his checked next week. We will obtain hepatitis panel in anticipation of rituximab.  2. CLL: Diagnosed in December 2014. It is likely the etiology of his autoimmune hemolytic anemia. He presented with very little to no adenopathy and mild splenomegaly.  Active hemolysis might be an indication to treat his CLL. He is reluctant to receive any treatment for CLL at this time beyond rituximab. He is not sure he was to receive rituximab either. We will defer any aggressive treatment for CLL for the time being.  3. Hyperglycemia: Managed by his primary care physician currently on oral hypoglycemic agent.  4. Splenomegaly: Related to CLL and not dramatically changed.  5. Follow-up: He will continue to follow with weekly labs and  M.D. follow-up in 3 weeks.   Zola Button, MD 8/22/20189:55 AM

## 2017-07-29 ENCOUNTER — Other Ambulatory Visit: Payer: Medicare Other

## 2017-07-29 ENCOUNTER — Encounter: Payer: Self-pay | Admitting: *Deleted

## 2017-07-29 ENCOUNTER — Other Ambulatory Visit (HOSPITAL_BASED_OUTPATIENT_CLINIC_OR_DEPARTMENT_OTHER): Payer: Medicare Other

## 2017-07-29 ENCOUNTER — Telehealth: Payer: Self-pay

## 2017-07-29 DIAGNOSIS — D649 Anemia, unspecified: Secondary | ICD-10-CM

## 2017-07-29 DIAGNOSIS — D599 Acquired hemolytic anemia, unspecified: Secondary | ICD-10-CM

## 2017-07-29 DIAGNOSIS — C9110 Chronic lymphocytic leukemia of B-cell type not having achieved remission: Secondary | ICD-10-CM

## 2017-07-29 DIAGNOSIS — C9190 Lymphoid leukemia, unspecified not having achieved remission: Secondary | ICD-10-CM | POA: Diagnosis not present

## 2017-07-29 LAB — COMPREHENSIVE METABOLIC PANEL
ALBUMIN: 3.8 g/dL (ref 3.5–5.0)
ALK PHOS: 65 U/L (ref 40–150)
ALT: 17 U/L (ref 0–55)
AST: 30 U/L (ref 5–34)
Anion Gap: 5 mEq/L (ref 3–11)
BILIRUBIN TOTAL: 3.58 mg/dL — AB (ref 0.20–1.20)
BUN: 18.8 mg/dL (ref 7.0–26.0)
CO2: 24 meq/L (ref 22–29)
Calcium: 9 mg/dL (ref 8.4–10.4)
Chloride: 110 mEq/L — ABNORMAL HIGH (ref 98–109)
Creatinine: 1.1 mg/dL (ref 0.7–1.3)
EGFR: 64 mL/min/{1.73_m2} — ABNORMAL LOW (ref >90)
GLUCOSE: 173 mg/dL — AB (ref 70–140)
Potassium: 4 mEq/L (ref 3.5–5.1)
SODIUM: 140 meq/L (ref 136–145)
TOTAL PROTEIN: 7.7 g/dL (ref 6.4–8.3)

## 2017-07-29 LAB — CBC WITH DIFFERENTIAL/PLATELET
BASO%: 0.6 % (ref 0.0–2.0)
BASOS ABS: 0 10*3/uL (ref 0.0–0.1)
EOS ABS: 0.1 10*3/uL (ref 0.0–0.5)
EOS%: 1.8 % (ref 0.0–7.0)
HCT: 25.7 % — ABNORMAL LOW (ref 38.4–49.9)
HEMOGLOBIN: 9.1 g/dL — AB (ref 13.0–17.1)
LYMPH#: 3.1 10*3/uL (ref 0.9–3.3)
LYMPH%: 41.3 % (ref 14.0–49.0)
MCH: 40.8 pg — ABNORMAL HIGH (ref 27.2–33.4)
MCHC: 35.4 g/dL (ref 32.0–36.0)
MCV: 115.4 fL — ABNORMAL HIGH (ref 79.3–98.0)
MONO#: 0.4 10*3/uL (ref 0.1–0.9)
MONO%: 5.4 % (ref 0.0–14.0)
NEUT%: 50.9 % (ref 39.0–75.0)
NEUTROS ABS: 3.9 10*3/uL (ref 1.5–6.5)
Platelets: 175 10*3/uL (ref 140–400)
RBC: 2.23 10*6/uL — ABNORMAL LOW (ref 4.20–5.82)
RDW: 19.6 % — AB (ref 11.0–14.6)
WBC: 7.6 10*3/uL (ref 4.0–10.3)

## 2017-07-29 LAB — TECHNOLOGIST REVIEW

## 2017-07-29 NOTE — Telephone Encounter (Signed)
Discussed total bili of 3.58 with Dr Alvy Bimler. No new orders at present.

## 2017-07-29 NOTE — Telephone Encounter (Signed)
Lab came with hold blood slip. Hgb is 9.1, platelets 175. No blood transfusion required.

## 2017-07-30 LAB — HEPATITIS B SURFACE ANTIGEN: HEP B S AG: NEGATIVE

## 2017-07-30 LAB — HEPATITIS B CORE ANTIBODY, TOTAL: HEP B C TOTAL AB: NEGATIVE

## 2017-07-30 LAB — HEPATITIS B SURFACE ANTIBODY,QUALITATIVE: HEP B SURFACE AB, QUAL: NONREACTIVE

## 2017-08-06 ENCOUNTER — Encounter: Payer: Self-pay | Admitting: Oncology

## 2017-08-06 ENCOUNTER — Other Ambulatory Visit: Payer: Self-pay | Admitting: Oncology

## 2017-08-06 NOTE — Progress Notes (Signed)
Spoke w/ pt to introduce myself as his Arboriculturist.  Pt has 2 insurances so copay assistance shouldn't be needed.  I informed him of the [place] and went over what it covers and also the [employer].  Pt would like to apply so he will bring his proof of income on 08/08/17 and I will give him the [place]anne [place] application to complete.  Once it's completed I will email it along with the needed docs required for processing to [facility].

## 2017-08-08 ENCOUNTER — Ambulatory Visit (HOSPITAL_BASED_OUTPATIENT_CLINIC_OR_DEPARTMENT_OTHER): Payer: Medicare Other

## 2017-08-08 ENCOUNTER — Other Ambulatory Visit (HOSPITAL_BASED_OUTPATIENT_CLINIC_OR_DEPARTMENT_OTHER): Payer: Medicare Other

## 2017-08-08 VITALS — BP 144/57 | HR 83 | Temp 98.9°F | Resp 18

## 2017-08-08 DIAGNOSIS — Z5112 Encounter for antineoplastic immunotherapy: Secondary | ICD-10-CM

## 2017-08-08 DIAGNOSIS — D591 Other autoimmune hemolytic anemias: Secondary | ICD-10-CM

## 2017-08-08 DIAGNOSIS — D588 Other specified hereditary hemolytic anemias: Secondary | ICD-10-CM

## 2017-08-08 DIAGNOSIS — D599 Acquired hemolytic anemia, unspecified: Secondary | ICD-10-CM

## 2017-08-08 DIAGNOSIS — D649 Anemia, unspecified: Secondary | ICD-10-CM

## 2017-08-08 DIAGNOSIS — C9110 Chronic lymphocytic leukemia of B-cell type not having achieved remission: Secondary | ICD-10-CM

## 2017-08-08 LAB — CBC WITH DIFFERENTIAL/PLATELET
BASO%: 0.4 % (ref 0.0–2.0)
Basophils Absolute: 0 10*3/uL (ref 0.0–0.1)
EOS%: 1.9 % (ref 0.0–7.0)
Eosinophils Absolute: 0.2 10*3/uL (ref 0.0–0.5)
HCT: 24.9 % — ABNORMAL LOW (ref 38.4–49.9)
HGB: 8.2 g/dL — ABNORMAL LOW (ref 13.0–17.1)
LYMPH%: 40.1 % (ref 14.0–49.0)
MCH: 34.7 pg — ABNORMAL HIGH (ref 27.2–33.4)
MCHC: 32.9 g/dL (ref 32.0–36.0)
MCV: 105.5 fL — ABNORMAL HIGH (ref 79.3–98.0)
MONO#: 0.7 10*3/uL (ref 0.1–0.9)
MONO%: 8.4 % (ref 0.0–14.0)
NEUT#: 4.2 10*3/uL (ref 1.5–6.5)
NEUT%: 49.2 % (ref 39.0–75.0)
Platelets: 127 10*3/uL — ABNORMAL LOW (ref 140–400)
RBC: 2.36 10*6/uL — ABNORMAL LOW (ref 4.20–5.82)
RDW: 20.6 % — ABNORMAL HIGH (ref 11.0–14.6)
WBC: 8.5 10*3/uL (ref 4.0–10.3)
lymph#: 3.4 10*3/uL — ABNORMAL HIGH (ref 0.9–3.3)
nRBC: 0 % (ref 0–0)

## 2017-08-08 LAB — COMPREHENSIVE METABOLIC PANEL
ALBUMIN: 3.9 g/dL (ref 3.5–5.0)
ALK PHOS: 60 U/L (ref 40–150)
ALT: 18 U/L (ref 0–55)
ANION GAP: 8 meq/L (ref 3–11)
AST: 38 U/L — ABNORMAL HIGH (ref 5–34)
BUN: 21.7 mg/dL (ref 7.0–26.0)
CALCIUM: 9.3 mg/dL (ref 8.4–10.4)
CO2: 22 mEq/L (ref 22–29)
Chloride: 108 mEq/L (ref 98–109)
Creatinine: 1 mg/dL (ref 0.7–1.3)
EGFR: 71 mL/min/{1.73_m2} — AB (ref >90)
Glucose: 179 mg/dl — ABNORMAL HIGH (ref 70–140)
POTASSIUM: 3.7 meq/L (ref 3.5–5.1)
Sodium: 138 mEq/L (ref 136–145)
Total Bilirubin: 5.18 mg/dL (ref 0.20–1.20)
Total Protein: 7.8 g/dL (ref 6.4–8.3)

## 2017-08-08 MED ORDER — DIPHENHYDRAMINE HCL 25 MG PO CAPS
ORAL_CAPSULE | ORAL | Status: AC
  Filled 2017-08-08: qty 2

## 2017-08-08 MED ORDER — ACETAMINOPHEN 325 MG PO TABS
ORAL_TABLET | ORAL | Status: AC
  Filled 2017-08-08: qty 2

## 2017-08-08 MED ORDER — DIPHENHYDRAMINE HCL 25 MG PO CAPS
50.0000 mg | ORAL_CAPSULE | Freq: Once | ORAL | Status: AC
  Administered 2017-08-08: 50 mg via ORAL

## 2017-08-08 MED ORDER — SODIUM CHLORIDE 0.9 % IV SOLN
Freq: Once | INTRAVENOUS | Status: AC
  Administered 2017-08-08: 09:00:00 via INTRAVENOUS

## 2017-08-08 MED ORDER — ACETAMINOPHEN 325 MG PO TABS
650.0000 mg | ORAL_TABLET | Freq: Once | ORAL | Status: AC
  Administered 2017-08-08: 650 mg via ORAL

## 2017-08-08 MED ORDER — SODIUM CHLORIDE 0.9 % IV SOLN
375.0000 mg/m2 | Freq: Once | INTRAVENOUS | Status: AC
  Administered 2017-08-08: 800 mg via INTRAVENOUS
  Filled 2017-08-08: qty 50

## 2017-08-08 NOTE — Progress Notes (Signed)
Per Tammi desk Rn for Dr Alen Blew, ok to tx today with bilirubin of 5.18.

## 2017-08-08 NOTE — Progress Notes (Signed)
OK to treat with today's labs per Dr Alen Blew. No blood needed

## 2017-08-08 NOTE — Patient Instructions (Signed)
[facility] Discharge Instructions for Patients Receiving Chemotherapy  Today you received the following chemotherapy agents Rituxan  To help prevent nausea and vomiting after your treatment, we encourage you to take your nausea medication as prescribed.   If you develop nausea and vomiting that is not controlled by your nausea medication, call the clinic.   BELOW ARE SYMPTOMS THAT SHOULD BE REPORTED IMMEDIATELY:  *FEVER GREATER THAN 100.5 F  *CHILLS WITH OR WITHOUT FEVER  NAUSEA AND VOMITING THAT IS NOT CONTROLLED WITH YOUR NAUSEA MEDICATION  *UNUSUAL SHORTNESS OF BREATH  *UNUSUAL BRUISING OR BLEEDING  TENDERNESS IN MOUTH AND THROAT WITH OR WITHOUT PRESENCE OF ULCERS  *URINARY PROBLEMS  *BOWEL PROBLEMS  UNUSUAL RASH Items with * indicate a potential emergency and should be followed up as soon as possible.  Feel free to call the clinic you have any questions or concerns. The clinic phone number is [phone].  Please show the [place] at check-in to the Emergency Department and triage nurse.  Rituximab injection (Rituxan) What is this medicine? RITUXIMAB (ri TUX i mab) is a monoclonal antibody. It is used to treat certain types of cancer like non-Hodgkin lymphoma and chronic lymphocytic leukemia. It is also used to treat rheumatoid arthritis, granulomatosis with polyangiitis (or Wegener's granulomatosis), and microscopic polyangiitis. This medicine may be used for other purposes; ask your health care provider or pharmacist if you have questions. COMMON BRAND NAME(S): Rituxan What should I tell my health care provider before I take this medicine? They need to know if you have any of these conditions: -heart disease -infection (especially a virus infection such as hepatitis B, chickenpox, cold sores, or herpes) -immune system problems -irregular heartbeat -kidney disease -lung or breathing disease, like asthma -recently received or scheduled to  receive a vaccine -an unusual or allergic reaction to rituximab, mouse proteins, other medicines, foods, dyes, or preservatives -pregnant or trying to get pregnant -breast-feeding How should I use this medicine? This medicine is for infusion into a vein. It is administered in a hospital or clinic by a specially trained health care professional. A special MedGuide will be given to you by the pharmacist with each prescription and refill. Be sure to read this information carefully each time. Talk to your pediatrician regarding the use of this medicine in children. This medicine is not approved for use in children. Overdosage: If you think you have taken too much of this medicine contact a poison control center or emergency room at once. NOTE: This medicine is only for you. Do not share this medicine with others. What if I miss a dose? It is important not to miss a dose. Call your doctor or health care professional if you are unable to keep an appointment. What may interact with this medicine? -cisplatin -other medicines for arthritis like disease modifying antirheumatic drugs or tumor necrosis factor inhibitors -live virus vaccines This list may not describe all possible interactions. Give your health care provider a list of all the medicines, herbs, non-prescription drugs, or dietary supplements you use. Also tell them if you smoke, drink alcohol, or use illegal drugs. Some items may interact with your medicine. What should I watch for while using this medicine? Your condition will be monitored carefully while you are receiving this medicine. You may need blood work done while you are taking this medicine. This medicine can cause serious allergic reactions. To reduce your risk you may need to take medicine before treatment with this medicine. Take your medicine  as directed. In some patients, this medicine may cause a serious brain infection that may cause death. If you have any problems seeing,  thinking, speaking, walking, or standing, tell your doctor right away. If you cannot reach your doctor, urgently seek other source of medical care. Call your doctor or health care professional for advice if you get a fever, chills or sore throat, or other symptoms of a cold or flu. Do not treat yourself. This drug decreases your body's ability to fight infections. Try to avoid being around people who are sick. Do not become pregnant while taking this medicine or for 12 months after stopping it. Women should inform their doctor if they wish to become pregnant or think they might be pregnant. There is a potential for serious side effects to an unborn child. Talk to your health care professional or pharmacist for more information. What side effects may I notice from receiving this medicine? Side effects that you should report to your doctor or health care professional as soon as possible: -breathing problems -chest pain -dizziness or feeling faint -fast, irregular heartbeat -low blood counts - this medicine may decrease the number of white blood cells, red blood cells and platelets. You may be at increased risk for infections and bleeding. -mouth sores -redness, blistering, peeling or loosening of the skin, including inside the mouth (this can be added for any serious or exfoliative rash that could lead to hospitalization) -signs of infection - fever or chills, cough, sore throat, pain or difficulty passing urine -signs and symptoms of kidney injury like trouble passing urine or change in the amount of urine -signs and symptoms of liver injury like dark yellow or brown urine; general ill feeling or flu-like symptoms; light-colored stools; loss of appetite; nausea; right upper belly pain; unusually weak or tired; yellowing of the eyes or skin -stomach pain -vomiting Side effects that usually do not require medical attention (report to your doctor or health care professional if they continue or are  bothersome): -headache -joint pain -muscle cramps or muscle pain This list may not describe all possible side effects. Call your doctor for medical advice about side effects. You may report side effects to FDA at 1-800-FDA-1088. Where should I keep my medicine? This drug is given in a hospital or clinic and will not be stored at home. NOTE: This sheet is a summary. It may not cover all possible information. If you have questions about this medicine, talk to your doctor, pharmacist, or health care provider.  2018 Elsevier/Gold Standard (2016-06-26 15:28:09)

## 2017-08-12 ENCOUNTER — Telehealth: Payer: Self-pay | Admitting: *Deleted

## 2017-08-12 NOTE — Telephone Encounter (Signed)
This RN called and spoke to wife regarding first time Ritxuan, chemotherapy follow up call. Wife stated,"Travarus is taking a nap right now." I instructed her not to wake him up but asked if he was having any trouble after Rituxan on Friday. She stated,"he had done great. No nausea, vomiting, diarrhea or pain. He has been eating and drinking without any problems."  Instructed her to call Anita if he had any questions or concerns. She verbalized understanding.

## 2017-08-14 ENCOUNTER — Ambulatory Visit (HOSPITAL_COMMUNITY)
Admission: RE | Admit: 2017-08-14 | Discharge: 2017-08-14 | Disposition: A | Discharge status: Home / Self Care | Payer: Medicare Other | Source: Ambulatory Visit | Attending: Oncology | Admitting: Oncology

## 2017-08-14 ENCOUNTER — Other Ambulatory Visit (HOSPITAL_BASED_OUTPATIENT_CLINIC_OR_DEPARTMENT_OTHER): Payer: Medicare Other

## 2017-08-14 ENCOUNTER — Encounter: Payer: Self-pay | Admitting: Oncology

## 2017-08-14 ENCOUNTER — Ambulatory Visit (HOSPITAL_BASED_OUTPATIENT_CLINIC_OR_DEPARTMENT_OTHER): Payer: Medicare Other | Admitting: Oncology

## 2017-08-14 ENCOUNTER — Telehealth: Payer: Self-pay | Admitting: Oncology

## 2017-08-14 ENCOUNTER — Ambulatory Visit (HOSPITAL_BASED_OUTPATIENT_CLINIC_OR_DEPARTMENT_OTHER): Payer: Medicare Other

## 2017-08-14 VITALS — BP 144/59 | HR 72 | Temp 98.3°F | Resp 18 | Ht 72.0 in | Wt 190.2 lb

## 2017-08-14 VITALS — BP 144/67 | HR 70 | Temp 98.5°F | Resp 18

## 2017-08-14 DIAGNOSIS — D591 Other autoimmune hemolytic anemias: Secondary | ICD-10-CM | POA: Diagnosis not present

## 2017-08-14 DIAGNOSIS — D599 Acquired hemolytic anemia, unspecified: Secondary | ICD-10-CM | POA: Diagnosis present

## 2017-08-14 DIAGNOSIS — D649 Anemia, unspecified: Secondary | ICD-10-CM | POA: Diagnosis not present

## 2017-08-14 DIAGNOSIS — R161 Splenomegaly, not elsewhere classified: Secondary | ICD-10-CM

## 2017-08-14 DIAGNOSIS — R739 Hyperglycemia, unspecified: Secondary | ICD-10-CM | POA: Diagnosis not present

## 2017-08-14 DIAGNOSIS — D588 Other specified hereditary hemolytic anemias: Secondary | ICD-10-CM

## 2017-08-14 DIAGNOSIS — C9110 Chronic lymphocytic leukemia of B-cell type not having achieved remission: Secondary | ICD-10-CM

## 2017-08-14 DIAGNOSIS — Z5112 Encounter for antineoplastic immunotherapy: Secondary | ICD-10-CM

## 2017-08-14 DIAGNOSIS — R17 Unspecified jaundice: Secondary | ICD-10-CM

## 2017-08-14 DIAGNOSIS — D509 Iron deficiency anemia, unspecified: Secondary | ICD-10-CM

## 2017-08-14 LAB — CBC WITH DIFFERENTIAL/PLATELET
BASO%: 0.4 % (ref 0.0–2.0)
Basophils Absolute: 0 10*3/uL (ref 0.0–0.1)
EOS ABS: 0.2 10*3/uL (ref 0.0–0.5)
EOS%: 2.1 % (ref 0.0–7.0)
HEMATOCRIT: 23.6 % — AB (ref 38.4–49.9)
HEMOGLOBIN: 7.8 g/dL — AB (ref 13.0–17.1)
LYMPH#: 5.2 10*3/uL — AB (ref 0.9–3.3)
LYMPH%: 46.1 % (ref 14.0–49.0)
MCH: 38 pg — ABNORMAL HIGH (ref 27.2–33.4)
MCV: 115.1 fL — ABNORMAL HIGH (ref 79.3–98.0)
MONO#: 0.8 10*3/uL (ref 0.1–0.9)
MONO%: 7.1 % (ref 0.0–14.0)
NEUT%: 44.3 % (ref 39.0–75.0)
NEUTROS ABS: 5 10*3/uL (ref 1.5–6.5)
NRBC: 1 % — AB (ref 0–0)
PLATELETS: 160 10*3/uL (ref 140–400)
RBC: 2.05 10*6/uL — ABNORMAL LOW (ref 4.20–5.82)
RDW: 26 % — AB (ref 11.0–14.6)
WBC: 11.2 10*3/uL — AB (ref 4.0–10.3)

## 2017-08-14 LAB — COMPREHENSIVE METABOLIC PANEL
ALBUMIN: 4 g/dL (ref 3.5–5.0)
ALT: 19 U/L (ref 0–55)
ANION GAP: 10 meq/L (ref 3–11)
AST: 41 U/L — AB (ref 5–34)
Alkaline Phosphatase: 72 U/L (ref 40–150)
BILIRUBIN TOTAL: 4.19 mg/dL — AB (ref 0.20–1.20)
BUN: 20.9 mg/dL (ref 7.0–26.0)
CALCIUM: 9.1 mg/dL (ref 8.4–10.4)
CHLORIDE: 110 meq/L — AB (ref 98–109)
CO2: 20 mEq/L — ABNORMAL LOW (ref 22–29)
CREATININE: 1 mg/dL (ref 0.7–1.3)
EGFR: 74 mL/min/{1.73_m2} — ABNORMAL LOW (ref >90)
Glucose: 185 mg/dl — ABNORMAL HIGH (ref 70–140)
Potassium: 3.9 mEq/L (ref 3.5–5.1)
Sodium: 139 mEq/L (ref 136–145)
TOTAL PROTEIN: 8.1 g/dL (ref 6.4–8.3)

## 2017-08-14 LAB — TECHNOLOGIST REVIEW

## 2017-08-14 LAB — PREPARE RBC (CROSSMATCH)

## 2017-08-14 MED ORDER — DIPHENHYDRAMINE HCL 25 MG PO CAPS
ORAL_CAPSULE | ORAL | Status: AC
  Filled 2017-08-14: qty 1

## 2017-08-14 MED ORDER — ACETAMINOPHEN 325 MG PO TABS
650.0000 mg | ORAL_TABLET | Freq: Once | ORAL | Status: AC
  Administered 2017-08-14: 650 mg via ORAL

## 2017-08-14 MED ORDER — DIPHENHYDRAMINE HCL 25 MG PO CAPS
50.0000 mg | ORAL_CAPSULE | Freq: Once | ORAL | Status: AC
  Administered 2017-08-14: 50 mg via ORAL

## 2017-08-14 MED ORDER — ACETAMINOPHEN 325 MG PO TABS
ORAL_TABLET | ORAL | Status: AC
  Filled 2017-08-14: qty 2

## 2017-08-14 MED ORDER — DIPHENHYDRAMINE HCL 25 MG PO CAPS
ORAL_CAPSULE | ORAL | Status: AC
  Filled 2017-08-14: qty 2

## 2017-08-14 MED ORDER — SODIUM CHLORIDE 0.9 % IV SOLN
375.0000 mg/m2 | Freq: Once | INTRAVENOUS | Status: AC
  Administered 2017-08-14: 800 mg via INTRAVENOUS
  Filled 2017-08-14: qty 50

## 2017-08-14 MED ORDER — SODIUM CHLORIDE 0.9 % IV SOLN
Freq: Once | INTRAVENOUS | Status: AC
  Administered 2017-08-14: 10:00:00 via INTRAVENOUS

## 2017-08-14 NOTE — Progress Notes (Unsigned)
Per Erline Levine, RN, per Dr. Alen Blew, [place] to treat with elevated bilirubin   Wylene Simmer, BSN, RN 08/14/2017 9:46 AM

## 2017-08-14 NOTE — Telephone Encounter (Signed)
Gave avs and calendar for september

## 2017-08-14 NOTE — Patient Instructions (Signed)
[place] Discharge Instructions for Patients Receiving Chemotherapy  Today you received the following chemotherapy agents Rituxan  To help prevent nausea and vomiting after your treatment, we encourage you to take your nausea medication as prescribed.   If you develop nausea and vomiting that is not controlled by your nausea medication, call the clinic.   BELOW ARE SYMPTOMS THAT SHOULD BE REPORTED IMMEDIATELY:  *FEVER GREATER THAN 100.5 F  *CHILLS WITH OR WITHOUT FEVER  NAUSEA AND VOMITING THAT IS NOT CONTROLLED WITH YOUR NAUSEA MEDICATION  *UNUSUAL SHORTNESS OF BREATH  *UNUSUAL BRUISING OR BLEEDING  TENDERNESS IN MOUTH AND THROAT WITH OR WITHOUT PRESENCE OF ULCERS  *URINARY PROBLEMS  *BOWEL PROBLEMS  UNUSUAL RASH Items with * indicate a potential emergency and should be followed up as soon as possible.  Feel free to call the clinic you have any questions or concerns. The clinic phone number is [phone].  Please show the [place] at check-in to the Emergency Department and triage nurse.  Rituximab injection (Rituxan) What is this medicine? RITUXIMAB (ri TUX i mab) is a monoclonal antibody. It is used to treat certain types of cancer like non-Hodgkin lymphoma and chronic lymphocytic leukemia. It is also used to treat rheumatoid arthritis, granulomatosis with polyangiitis (or Wegener's granulomatosis), and microscopic polyangiitis. This medicine may be used for other purposes; ask your health care provider or pharmacist if you have questions. COMMON BRAND NAME(S): Rituxan What should I tell my health care provider before I take this medicine? They need to know if you have any of these conditions: -heart disease -infection (especially a virus infection such as hepatitis B, chickenpox, cold sores, or herpes) -immune system problems -irregular heartbeat -kidney disease -lung or breathing disease, like asthma -recently received or scheduled to  receive a vaccine -an unusual or allergic reaction to rituximab, mouse proteins, other medicines, foods, dyes, or preservatives -pregnant or trying to get pregnant -breast-feeding How should I use this medicine? This medicine is for infusion into a vein. It is administered in a hospital or clinic by a specially trained health care professional. A special MedGuide will be given to you by the pharmacist with each prescription and refill. Be sure to read this information carefully each time. Talk to your pediatrician regarding the use of this medicine in children. This medicine is not approved for use in children. Overdosage: If you think you have taken too much of this medicine contact a poison control center or emergency room at once. NOTE: This medicine is only for you. Do not share this medicine with others. What if I miss a dose? It is important not to miss a dose. Call your doctor or health care professional if you are unable to keep an appointment. What may interact with this medicine? -cisplatin -other medicines for arthritis like disease modifying antirheumatic drugs or tumor necrosis factor inhibitors -live virus vaccines This list may not describe all possible interactions. Give your health care provider a list of all the medicines, herbs, non-prescription drugs, or dietary supplements you use. Also tell them if you smoke, drink alcohol, or use illegal drugs. Some items may interact with your medicine. What should I watch for while using this medicine? Your condition will be monitored carefully while you are receiving this medicine. You may need blood work done while you are taking this medicine. This medicine can cause serious allergic reactions. To reduce your risk you may need to take medicine before treatment with this medicine. Take your medicine  as directed. In some patients, this medicine may cause a serious brain infection that may cause death. If you have any problems seeing,  thinking, speaking, walking, or standing, tell your doctor right away. If you cannot reach your doctor, urgently seek other source of medical care. Call your doctor or health care professional for advice if you get a fever, chills or sore throat, or other symptoms of a cold or flu. Do not treat yourself. This drug decreases your body's ability to fight infections. Try to avoid being around people who are sick. Do not become pregnant while taking this medicine or for 12 months after stopping it. Women should inform their doctor if they wish to become pregnant or think they might be pregnant. There is a potential for serious side effects to an unborn child. Talk to your health care professional or pharmacist for more information. What side effects may I notice from receiving this medicine? Side effects that you should report to your doctor or health care professional as soon as possible: -breathing problems -chest pain -dizziness or feeling faint -fast, irregular heartbeat -low blood counts - this medicine may decrease the number of white blood cells, red blood cells and platelets. You may be at increased risk for infections and bleeding. -mouth sores -redness, blistering, peeling or loosening of the skin, including inside the mouth (this can be added for any serious or exfoliative rash that could lead to hospitalization) -signs of infection - fever or chills, cough, sore throat, pain or difficulty passing urine -signs and symptoms of kidney injury like trouble passing urine or change in the amount of urine -signs and symptoms of liver injury like dark yellow or brown urine; general ill feeling or flu-like symptoms; light-colored stools; loss of appetite; nausea; right upper belly pain; unusually weak or tired; yellowing of the eyes or skin -stomach pain -vomiting Side effects that usually do not require medical attention (report to your doctor or health care professional if they continue or are  bothersome): -headache -joint pain -muscle cramps or muscle pain This list may not describe all possible side effects. Call your doctor for medical advice about side effects. You may report side effects to FDA at 1-800-FDA-1088. Where should I keep my medicine? This drug is given in a hospital or clinic and will not be stored at home. NOTE: This sheet is a summary. It may not cover all possible information. If you have questions about this medicine, talk to your doctor, pharmacist, or health care provider.  2018 Elsevier/Gold Standard (2016-06-26 15:28:09)

## 2017-08-14 NOTE — Progress Notes (Signed)
Pt is approved for the $400 [facility] grant.  °

## 2017-08-14 NOTE — Progress Notes (Signed)
Hematology and Oncology Follow Up Visit  Richard Evans [id number] 02-22-1946 71 y.o. 08/14/2017 9:17 AM Richard Evans, MDVyas, Richard Hatcher, MD   Principle Diagnosis: 71 year old gentleman with autoimmune hemolytic anemia diagnosed on July 30 of 2017. He presented with hemoglobin of 7.5, MCV 106 with elevated reticulocyte count and haptoglobin less than 10. Coombs testing was positive. CT scan of the chest abdomen and pelvis showed nonspecific hilar adenopathy. Bone marrow biopsy obtained on 11/14/2016 confirmed the diagnosis of CLL.   Prior Therapy:   Status post packed red cell transfusion for 1 unit on 07/04/2016. Prednisone 70 mg daily started on 06/30/2016. He achieved complete response and prednisone tapered completely off on 09/11/2016. He relapsed quickly with worsening anemia. Prednisone 90 mg daily started on 09/30/2016. He achieved complete response on 10/17/2016. He completed prednisone taper on 12/18/2016.  Current therapy: Rituximab at 3 75 mg/m weekly. He started on 08/08/2017. He is here for week 2 of therapy.   Interim History:  Richard Evans presents today for a follow-up visit. Since the last visit, he reports receiving rituximab for the first week without complications. He denied any infusion-related complications or anaphylaxis. He is reporting more fatigue however as well as jaundice. He is reporting discoloration of his urine as well as his skin. He still able to walk without any difficulties. He is not driving as much as at this time. He denied any fevers or chills. He denied any adenopathy.  He does not report any headaches, blurry vision, syncope or seizures. He is not report any fevers or chills or weight loss. He does not report any chest pain, palpitation, orthopnea or leg edema. He does not report any cough, wheezing or hemoptysis. He does not report any nausea, vomiting or abdominal pain. He does not report any frequency urgency or hesitancy. He has not reported any skeletal  complaints. Remaining review of systems unremarkable   Medications: I have reviewed the patient's current medications.  Current Outpatient Prescriptions  Medication Sig Dispense Refill  . atenolol (TENORMIN) 25 MG tablet Take 25 mg by mouth at bedtime.     . diphenhydrAMINE (BENADRYL) 25 mg capsule Take 25 mg by mouth every 6 (six) hours as needed.    . folic acid (FOLVITE) 1 MG tablet TAKE ONE TABLET BY MOUTH EVERY DAY 30 tablet 2  . glipiZIDE (GLUCOTROL XL) 5 MG 24 hr tablet Take 5 mg by mouth daily before breakfast.     . metoprolol succinate (TOPROL-XL) 25 MG 24 hr tablet Take 25 mg by mouth daily. Pharmacy unable to get atenolol, taking this for now    . polyethylene glycol powder (CLEARLAX) powder Take 17 g by mouth daily as needed (constipation).    . Tamsulosin HCl (FLOMAX) 0.4 MG CAPS Take 0.4 mg by mouth at bedtime.      No current facility-administered medications for this visit.      Allergies:  Allergies  Allergen Reactions  . Adhesive [Tape] Itching and Rash    Paper tape or wrapped bandage is preferred    Past Medical History, Surgical history, Social history, and Family History were reviewed and updated.  Physical Exam: Blood pressure (!) 144/59, pulse 72, temperature 98.3 F (36.8 C), temperature source Oral, resp. rate 18, height 6' (1.829 m), weight 190 lb 3.2 oz (86.3 kg), SpO2 100 %. ECOG: 0 General appearance: Alert, awake gentleman without distress. Head: Normocephalic, without obvious abnormality. No oral ulcers or lesions. Scleral icterus noted. Neck: no adenopathy Lymph nodes: Cervical, supraclavicular,  and axillary nodes normal. Heart:regular rate and rhythm, S1, S2 normal, no murmur, click, rub or gallop Lung:chest clear, no wheezing, rales, normal symmetric air entry.  Abdomin: Soft, nontender without rebound or guarding. Mild splenomegaly noted. EXT: No edema or erythema.   Lab Results: Lab Results  Component Value Date   WBC 11.2 (H)  08/14/2017   HGB 7.8 (L) 08/14/2017   HCT 23.6 (L) 08/14/2017   MCV 115.1 (H) 08/14/2017   PLT 160 08/14/2017     Chemistry      Component Value Date/Time   NA 139 08/14/2017 0750   K 3.9 08/14/2017 0750   CL 106 11/14/2016 0723   CO2 20 (L) 08/14/2017 0750   BUN 20.9 08/14/2017 0750   CREATININE 1.0 08/14/2017 0750      Component Value Date/Time   CALCIUM 9.1 08/14/2017 0750   ALKPHOS 72 08/14/2017 0750   AST 41 (H) 08/14/2017 0750   ALT 19 08/14/2017 0750   BILITOT 4.19 (HH) 08/14/2017 0750      GROSS AND MICROSCOPIC INFORMATION  Impression and Plan:   71 year old gentleman with the following issues:  1. Autoimmune hemolytic anemia presented with hemoglobin of 7.5 and positive Coombs testing. He was diagnosed In 06/30/2016.  He achieved complete response after initial prednisone therapy but relapsed after prednisone taper.  He is currently receiving rituximab without any complications. The plan is to proceed with week 2 of therapy without any dose reduction or delay.  He is asymptomatic from his anemia and we'll arrange for one unit of packed red cells in the immediate future.  2. CLL: Diagnosed in December 2014. It is likely the etiology of his autoimmune hemolytic anemia. He presented with very little to no adenopathy and mild splenomegaly.  Rituximab should help with treatment of his CLL. Adding chemotherapy might be required if his hemolysis continues to be an issue.  3. Hyperglycemia: Managed by his primary care physician currently on oral hypoglycemic agent.  4. Splenomegaly: Related to CLL and not dramatically changed.  5. Jaundice: Appears to be related to elevated bilirubin from hemolysis rather than liver disease. His bilirubin is decreasing slightly.  6. Follow-up: He will continue to follow with weekly labs and M.D. follow-up.    Richard Button, MD 9/13/20189:17 AM

## 2017-08-15 ENCOUNTER — Ambulatory Visit (HOSPITAL_BASED_OUTPATIENT_CLINIC_OR_DEPARTMENT_OTHER): Payer: Medicare Other

## 2017-08-15 DIAGNOSIS — D649 Anemia, unspecified: Secondary | ICD-10-CM

## 2017-08-15 MED ORDER — SODIUM CHLORIDE 0.9 % IV SOLN
250.0000 mL | Freq: Once | INTRAVENOUS | Status: AC
  Administered 2017-08-15: 250 mL via INTRAVENOUS

## 2017-08-15 NOTE — Patient Instructions (Signed)

## 2017-08-16 LAB — TYPE AND SCREEN
ABO/RH(D): O NEG
ANTIBODY SCREEN: POSITIVE
DAT, IGG: POSITIVE
Unit division: 0

## 2017-08-16 LAB — BPAM RBC
BLOOD PRODUCT EXPIRATION DATE: 201810232359
ISSUE DATE / TIME: 201809141010
UNIT TYPE AND RH: 9500

## 2017-08-20 ENCOUNTER — Ambulatory Visit (HOSPITAL_BASED_OUTPATIENT_CLINIC_OR_DEPARTMENT_OTHER): Payer: Medicare Other

## 2017-08-20 ENCOUNTER — Telehealth: Payer: Self-pay | Admitting: Oncology

## 2017-08-20 ENCOUNTER — Other Ambulatory Visit (HOSPITAL_BASED_OUTPATIENT_CLINIC_OR_DEPARTMENT_OTHER): Payer: Medicare Other

## 2017-08-20 ENCOUNTER — Ambulatory Visit (HOSPITAL_BASED_OUTPATIENT_CLINIC_OR_DEPARTMENT_OTHER): Payer: Medicare Other | Admitting: Oncology

## 2017-08-20 VITALS — BP 158/75 | HR 85 | Temp 98.0°F | Resp 17 | Ht 72.0 in | Wt 188.8 lb

## 2017-08-20 VITALS — BP 151/61 | HR 71 | Temp 97.9°F | Resp 18

## 2017-08-20 DIAGNOSIS — Z5112 Encounter for antineoplastic immunotherapy: Secondary | ICD-10-CM

## 2017-08-20 DIAGNOSIS — D591 Other autoimmune hemolytic anemias: Secondary | ICD-10-CM

## 2017-08-20 DIAGNOSIS — D588 Other specified hereditary hemolytic anemias: Secondary | ICD-10-CM

## 2017-08-20 DIAGNOSIS — D599 Acquired hemolytic anemia, unspecified: Secondary | ICD-10-CM

## 2017-08-20 DIAGNOSIS — R17 Unspecified jaundice: Secondary | ICD-10-CM

## 2017-08-20 DIAGNOSIS — R739 Hyperglycemia, unspecified: Secondary | ICD-10-CM | POA: Diagnosis not present

## 2017-08-20 DIAGNOSIS — C9110 Chronic lymphocytic leukemia of B-cell type not having achieved remission: Secondary | ICD-10-CM | POA: Diagnosis not present

## 2017-08-20 DIAGNOSIS — D649 Anemia, unspecified: Secondary | ICD-10-CM

## 2017-08-20 LAB — CBC WITH DIFFERENTIAL/PLATELET
BASO%: 0.4 % (ref 0.0–2.0)
Basophils Absolute: 0 10*3/uL (ref 0.0–0.1)
EOS%: 2 % (ref 0.0–7.0)
Eosinophils Absolute: 0.2 10*3/uL (ref 0.0–0.5)
HCT: 23.3 % — ABNORMAL LOW (ref 38.4–49.9)
HEMOGLOBIN: 8.3 g/dL — AB (ref 13.0–17.1)
LYMPH%: 42.1 % (ref 14.0–49.0)
MCH: 42.1 pg — ABNORMAL HIGH (ref 27.2–33.4)
MCHC: 35.6 g/dL (ref 32.0–36.0)
MCV: 118.3 fL — AB (ref 79.3–98.0)
MONO#: 0.4 10*3/uL (ref 0.1–0.9)
MONO%: 4.9 % (ref 0.0–14.0)
NEUT%: 50.6 % (ref 39.0–75.0)
NEUTROS ABS: 4.2 10*3/uL (ref 1.5–6.5)
Platelets: 151 10*3/uL (ref 140–400)
RBC: 1.97 10*6/uL — AB (ref 4.20–5.82)
RDW: 29.3 % — AB (ref 11.0–14.6)
WBC: 8.3 10*3/uL (ref 4.0–10.3)
lymph#: 3.5 10*3/uL — ABNORMAL HIGH (ref 0.9–3.3)

## 2017-08-20 LAB — COMPREHENSIVE METABOLIC PANEL
ALT: 16 U/L (ref 0–55)
AST: 36 U/L — AB (ref 5–34)
Albumin: 4 g/dL (ref 3.5–5.0)
Alkaline Phosphatase: 66 U/L (ref 40–150)
Anion Gap: 10 mEq/L (ref 3–11)
BILIRUBIN TOTAL: 4.84 mg/dL — AB (ref 0.20–1.20)
BUN: 19.8 mg/dL (ref 7.0–26.0)
CO2: 21 meq/L — AB (ref 22–29)
Calcium: 9.3 mg/dL (ref 8.4–10.4)
Chloride: 109 mEq/L (ref 98–109)
Creatinine: 1.1 mg/dL (ref 0.7–1.3)
EGFR: 67 mL/min/{1.73_m2} — ABNORMAL LOW (ref >90)
GLUCOSE: 195 mg/dL — AB (ref 70–140)
Potassium: 3.9 mEq/L (ref 3.5–5.1)
SODIUM: 141 meq/L (ref 136–145)
TOTAL PROTEIN: 8 g/dL (ref 6.4–8.3)

## 2017-08-20 MED ORDER — DIPHENHYDRAMINE HCL 25 MG PO CAPS
ORAL_CAPSULE | ORAL | Status: AC
  Filled 2017-08-20: qty 2

## 2017-08-20 MED ORDER — SODIUM CHLORIDE 0.9 % IV SOLN
Freq: Once | INTRAVENOUS | Status: AC
  Administered 2017-08-20: 09:00:00 via INTRAVENOUS

## 2017-08-20 MED ORDER — RITUXIMAB CHEMO INJECTION 500 MG/50ML
375.0000 mg/m2 | Freq: Once | INTRAVENOUS | Status: AC
  Administered 2017-08-20: 800 mg via INTRAVENOUS
  Filled 2017-08-20: qty 50

## 2017-08-20 MED ORDER — ACETAMINOPHEN 325 MG PO TABS
ORAL_TABLET | ORAL | Status: AC
  Filled 2017-08-20: qty 2

## 2017-08-20 MED ORDER — DIPHENHYDRAMINE HCL 25 MG PO CAPS
50.0000 mg | ORAL_CAPSULE | Freq: Once | ORAL | Status: AC
  Administered 2017-08-20: 50 mg via ORAL

## 2017-08-20 MED ORDER — ACETAMINOPHEN 325 MG PO TABS
650.0000 mg | ORAL_TABLET | Freq: Once | ORAL | Status: AC
  Administered 2017-08-20: 650 mg via ORAL

## 2017-08-20 NOTE — Telephone Encounter (Signed)
Gave avs and calendar for September

## 2017-08-20 NOTE — Patient Instructions (Signed)

## 2017-08-20 NOTE — Progress Notes (Signed)
Hematology and Oncology Follow Up Visit  Richard Evans [id number] 1946/01/06 70 y.o. 08/20/2017 8:28 AM [name], MDVyas, [name], MD   Principle Diagnosis: 71 year old gentleman with autoimmune hemolytic anemia diagnosed on July 30 of 2017. He presented with hemoglobin of 7.5, MCV 106 with elevated reticulocyte count and haptoglobin less than 10. Coombs testing was positive. CT scan of the chest abdomen and pelvis showed nonspecific hilar adenopathy. Bone marrow biopsy obtained on 11/14/2016 confirmed the diagnosis of CLL.   Prior Therapy:   Status post packed red cell transfusion for 1 unit on 07/04/2016. Prednisone 70 mg daily started on 06/30/2016. He achieved complete response and prednisone tapered completely off on 09/11/2016. He relapsed quickly with worsening anemia. Prednisone 90 mg daily started on 09/30/2016. He achieved complete response on 10/17/2016. He completed prednisone taper on 12/18/2016.  Current therapy: Rituximab at 3 75 mg/m weekly. He started on 08/08/2017. He is here for week 3 of therapy.   Interim History:  Richard Evans presents today for a follow-up visit. Since the last visit, he feels slightly better than last week.received 1 unit of red cell transfusion with improvement in performance status. He is able to work outside the house for limited period of time. He received rituximab last treatment without complications. He denied any infusion-related complications or anaphylaxis. He is reporting discoloration of his urine as well as his skin. He still able to walk without any difficulties. He denied any falls or syncope. He denied any shortness of breath or dyspnea on exertion.  He does not report any headaches, blurry vision, syncope or seizures. He is not report any fevers or chills or weight loss. He does not report any chest pain, palpitation, orthopnea or leg edema. He does not report any cough, wheezing or hemoptysis. He does not report any nausea, vomiting or  abdominal pain. He does not report any frequency urgency or hesitancy. He has not reported any skeletal complaints. Remaining review of systems unremarkable   Medications: I have reviewed the patient's current medications.  Current Outpatient Prescriptions  Medication Sig Dispense Refill  . atenolol (TENORMIN) 25 MG tablet Take 25 mg by mouth at bedtime.     . diphenhydrAMINE (BENADRYL) 25 mg capsule Take 25 mg by mouth every 6 (six) hours as needed.    . folic acid (FOLVITE) 1 MG tablet TAKE ONE TABLET BY MOUTH EVERY DAY 30 tablet 2  . glipiZIDE (GLUCOTROL XL) 5 MG 24 hr tablet Take 5 mg by mouth daily before breakfast.     . metoprolol succinate (TOPROL-XL) 25 MG 24 hr tablet Take 25 mg by mouth daily. Pharmacy unable to get atenolol, taking this for now    . polyethylene glycol powder (CLEARLAX) powder Take 17 g by mouth daily as needed (constipation).    . Tamsulosin HCl (FLOMAX) 0.4 MG CAPS Take 0.4 mg by mouth at bedtime.      No current facility-administered medications for this visit.      Allergies:  Allergies  Allergen Reactions  . Adhesive [Tape] Itching and Rash    Paper tape or wrapped bandage is preferred    Past Medical History, Surgical history, Social history, and Family History were reviewed and updated.  Physical Exam: Blood pressure (!) 158/75, pulse 85, temperature 98 F (36.7 C), temperature source Oral, resp. rate 17, height 6' (1.829 m), weight 188 lb 12.8 oz (85.6 kg), SpO2 98 %. ECOG: 0 General appearance: Well-appearing gentleman without distress. Head: Normocephalic, without obvious abnormality. No oral  thrush noted. Scleral icterus noted. Neck: no adenopathy Lymph nodes: Cervical, supraclavicular, and axillary nodes normal. Heart:regular rate and rhythm, S1, S2 normal, no murmur, click, rub or gallop Lung:chest clear, no wheezing, rales, normal symmetric air entry.  Abdomin: Soft, nontender without rebound or guarding. The spleen remains  palpable. EXT: No edema or erythema.   Lab Results: Lab Results  Component Value Date   WBC 8.3 08/20/2017   HGB 8.3 (L) 08/20/2017   HCT 23.3 (L) 08/20/2017   MCV 118.3 (H) 08/20/2017   PLT 151 08/20/2017     Chemistry      Component Value Date/Time   NA 139 08/14/2017 0750   K 3.9 08/14/2017 0750   CL 106 11/14/2016 0723   CO2 20 (L) 08/14/2017 0750   BUN 20.9 08/14/2017 0750   CREATININE 1.0 08/14/2017 0750      Component Value Date/Time   CALCIUM 9.1 08/14/2017 0750   ALKPHOS 72 08/14/2017 0750   AST 41 (H) 08/14/2017 0750   ALT 19 08/14/2017 0750   BILITOT 4.19 (HH) 08/14/2017 0750      GROSS AND MICROSCOPIC INFORMATION  Impression and Plan:   71 year old gentleman with the following issues:  1. Autoimmune hemolytic anemia presented with hemoglobin of 7.5 and positive Coombs testing. He was diagnosed In 06/30/2016.  He achieved complete response after initial prednisone therapy but relapsed after prednisone taper.  He is currently receiving rituximab without any complications. The plan is to proceed with week 3 of therapy without any dose reduction or delay.  We will arrange for potential transfusion in 1 week if needed to. He does not require transfusion this week.  2. CLL: Diagnosed in December 2014. It is likely the etiology of his autoimmune hemolytic anemia. He presented with very little to no adenopathy and mild splenomegaly.  Rituximab should help with treatment of his CLL. No changes in his clinical presentation.  3. Hyperglycemia: Managed by his primary care physician. He is off oral antihyperglycemic medication.  4. Splenomegaly: Related to CLL and not dramatically changed.  5. Jaundice: Appears to be related to elevated bilirubin from hemolysis rather than liver disease. His bilirubin is decreasing slightly.  6. Follow-up: He will follow-up in 1 week for the fourth cycle of rituximab.   Zola Button, MD 9/19/20188:28 AM

## 2017-08-25 ENCOUNTER — Encounter: Payer: Self-pay | Admitting: *Deleted

## 2017-08-25 ENCOUNTER — Ambulatory Visit (HOSPITAL_BASED_OUTPATIENT_CLINIC_OR_DEPARTMENT_OTHER): Payer: Medicare Other

## 2017-08-25 ENCOUNTER — Telehealth: Payer: Self-pay | Admitting: *Deleted

## 2017-08-25 ENCOUNTER — Other Ambulatory Visit: Payer: Self-pay | Admitting: *Deleted

## 2017-08-25 DIAGNOSIS — D649 Anemia, unspecified: Secondary | ICD-10-CM

## 2017-08-25 LAB — CBC WITH DIFFERENTIAL/PLATELET
BASO%: 0.4 % (ref 0.0–2.0)
Basophils Absolute: 0 10*3/uL (ref 0.0–0.1)
EOS%: 1.5 % (ref 0.0–7.0)
Eosinophils Absolute: 0.2 10*3/uL (ref 0.0–0.5)
HEMATOCRIT: 21.4 % — AB (ref 38.4–49.9)
HGB: 7.5 g/dL — ABNORMAL LOW (ref 13.0–17.1)
LYMPH%: 45.8 % (ref 14.0–49.0)
MCH: 41.9 pg — AB (ref 27.2–33.4)
MCHC: 35 g/dL (ref 32.0–36.0)
MCV: 119.6 fL — ABNORMAL HIGH (ref 79.3–98.0)
MONO#: 0.6 10*3/uL (ref 0.1–0.9)
MONO%: 5.5 % (ref 0.0–14.0)
NEUT#: 5 10*3/uL (ref 1.5–6.5)
NEUT%: 46.8 % (ref 39.0–75.0)
Platelets: 161 10*3/uL (ref 140–400)
RBC: 1.79 10*6/uL — ABNORMAL LOW (ref 4.20–5.82)
RDW: 29.2 % — ABNORMAL HIGH (ref 11.0–14.6)
WBC: 10.7 10*3/uL — ABNORMAL HIGH (ref 4.0–10.3)
lymph#: 4.9 10*3/uL — ABNORMAL HIGH (ref 0.9–3.3)
nRBC: 1 % — ABNORMAL HIGH (ref 0–0)

## 2017-08-25 LAB — TECHNOLOGIST REVIEW

## 2017-08-25 NOTE — Telephone Encounter (Signed)
Wife Juliann Pulse calling to say patient is very weak and no energy. Doesn't think he can wait until Thursday for blood transfusion. Patient in for cbc today, hgb down to 7.3. Set up transfusion for 08-26-17 @ 8:00 am. If blood bank able to type and cross him. Instructed patient to call blood bank before they leave home, since they have an hour's drive, to see if blood is ready.

## 2017-08-26 ENCOUNTER — Ambulatory Visit (HOSPITAL_BASED_OUTPATIENT_CLINIC_OR_DEPARTMENT_OTHER): Payer: Medicare Other

## 2017-08-26 DIAGNOSIS — D649 Anemia, unspecified: Secondary | ICD-10-CM

## 2017-08-26 LAB — PREPARE RBC (CROSSMATCH)

## 2017-08-26 MED ORDER — DIPHENHYDRAMINE HCL 25 MG PO CAPS
25.0000 mg | ORAL_CAPSULE | Freq: Once | ORAL | Status: AC
  Administered 2017-08-26: 25 mg via ORAL

## 2017-08-26 MED ORDER — SODIUM CHLORIDE 0.9% FLUSH
10.0000 mL | INTRAVENOUS | Status: DC | PRN
  Filled 2017-08-26: qty 10

## 2017-08-26 MED ORDER — ACETAMINOPHEN 325 MG PO TABS
650.0000 mg | ORAL_TABLET | Freq: Once | ORAL | Status: AC
Start: 2017-08-26 — End: 2017-08-26
  Administered 2017-08-26: 650 mg via ORAL

## 2017-08-26 MED ORDER — ACETAMINOPHEN 325 MG PO TABS
ORAL_TABLET | ORAL | Status: AC
  Filled 2017-08-26: qty 2

## 2017-08-26 MED ORDER — SODIUM CHLORIDE 0.9 % IV SOLN
250.0000 mL | Freq: Once | INTRAVENOUS | Status: AC
  Administered 2017-08-26: 250 mL via INTRAVENOUS

## 2017-08-26 MED ORDER — HEPARIN SOD (PORK) LOCK FLUSH 100 UNIT/ML IV SOLN
500.0000 [IU] | Freq: Every day | INTRAVENOUS | Status: DC | PRN
  Filled 2017-08-26: qty 5

## 2017-08-26 MED ORDER — DIPHENHYDRAMINE HCL 25 MG PO CAPS
ORAL_CAPSULE | ORAL | Status: AC
  Filled 2017-08-26: qty 1

## 2017-08-26 NOTE — Patient Instructions (Signed)

## 2017-08-26 NOTE — Telephone Encounter (Signed)
Beth from blood bank calling. They have 1 unit of p-rbc's ready for patient. Patient notified, they are on the way to the cancer center now.

## 2017-08-27 ENCOUNTER — Ambulatory Visit (HOSPITAL_BASED_OUTPATIENT_CLINIC_OR_DEPARTMENT_OTHER): Payer: Medicare Other

## 2017-08-27 ENCOUNTER — Ambulatory Visit (HOSPITAL_BASED_OUTPATIENT_CLINIC_OR_DEPARTMENT_OTHER): Payer: Medicare Other | Admitting: Oncology

## 2017-08-27 ENCOUNTER — Other Ambulatory Visit (HOSPITAL_BASED_OUTPATIENT_CLINIC_OR_DEPARTMENT_OTHER): Payer: Medicare Other

## 2017-08-27 VITALS — BP 152/51 | HR 74 | Temp 98.4°F | Resp 18

## 2017-08-27 VITALS — BP 158/75 | HR 77 | Temp 97.8°F | Resp 18 | Ht 72.0 in | Wt 191.5 lb

## 2017-08-27 DIAGNOSIS — Z5112 Encounter for antineoplastic immunotherapy: Secondary | ICD-10-CM | POA: Diagnosis present

## 2017-08-27 DIAGNOSIS — C9110 Chronic lymphocytic leukemia of B-cell type not having achieved remission: Secondary | ICD-10-CM

## 2017-08-27 DIAGNOSIS — D599 Acquired hemolytic anemia, unspecified: Secondary | ICD-10-CM

## 2017-08-27 DIAGNOSIS — D591 Other autoimmune hemolytic anemias: Secondary | ICD-10-CM | POA: Diagnosis not present

## 2017-08-27 DIAGNOSIS — R17 Unspecified jaundice: Secondary | ICD-10-CM

## 2017-08-27 DIAGNOSIS — R161 Splenomegaly, not elsewhere classified: Secondary | ICD-10-CM | POA: Diagnosis not present

## 2017-08-27 DIAGNOSIS — D588 Other specified hereditary hemolytic anemias: Secondary | ICD-10-CM

## 2017-08-27 DIAGNOSIS — D649 Anemia, unspecified: Secondary | ICD-10-CM

## 2017-08-27 LAB — CBC WITH DIFFERENTIAL/PLATELET
BASO%: 0.9 % (ref 0.0–2.0)
Basophils Absolute: 0.1 10*3/uL (ref 0.0–0.1)
EOS%: 1.8 % (ref 0.0–7.0)
Eosinophils Absolute: 0.2 10*3/uL (ref 0.0–0.5)
HEMATOCRIT: 25 % — AB (ref 38.4–49.9)
HEMOGLOBIN: 9 g/dL — AB (ref 13.0–17.1)
LYMPH%: 42.1 % (ref 14.0–49.0)
MCH: 43.3 pg — AB (ref 27.2–33.4)
MCHC: 36.2 g/dL — AB (ref 32.0–36.0)
MCV: 119.4 fL — AB (ref 79.3–98.0)
MONO#: 0.4 10*3/uL (ref 0.1–0.9)
MONO%: 4.5 % (ref 0.0–14.0)
NEUT#: 4.3 10*3/uL (ref 1.5–6.5)
NEUT%: 50.7 % (ref 39.0–75.0)
Platelets: 150 10*3/uL (ref 140–400)
RBC: 2.09 10*6/uL — ABNORMAL LOW (ref 4.20–5.82)
RDW: 21.5 % — ABNORMAL HIGH (ref 11.0–14.6)
WBC: 8.4 10*3/uL (ref 4.0–10.3)
lymph#: 3.5 10*3/uL — ABNORMAL HIGH (ref 0.9–3.3)

## 2017-08-27 LAB — COMPREHENSIVE METABOLIC PANEL
ALBUMIN: 3.9 g/dL (ref 3.5–5.0)
ALK PHOS: 71 U/L (ref 40–150)
ALT: 18 U/L (ref 0–55)
ANION GAP: 8 meq/L (ref 3–11)
AST: 39 U/L — AB (ref 5–34)
BUN: 18.3 mg/dL (ref 7.0–26.0)
CALCIUM: 9.1 mg/dL (ref 8.4–10.4)
CHLORIDE: 109 meq/L (ref 98–109)
CO2: 22 mEq/L (ref 22–29)
Creatinine: 0.9 mg/dL (ref 0.7–1.3)
EGFR: 86 mL/min/{1.73_m2} — ABNORMAL LOW (ref >90)
Glucose: 196 mg/dl — ABNORMAL HIGH (ref 70–140)
POTASSIUM: 3.8 meq/L (ref 3.5–5.1)
Sodium: 140 mEq/L (ref 136–145)
Total Bilirubin: 4.92 mg/dL (ref 0.20–1.20)
Total Protein: 7.8 g/dL (ref 6.4–8.3)

## 2017-08-27 LAB — TECHNOLOGIST REVIEW

## 2017-08-27 MED ORDER — DIPHENHYDRAMINE HCL 25 MG PO CAPS
50.0000 mg | ORAL_CAPSULE | Freq: Once | ORAL | Status: AC
  Administered 2017-08-27: 50 mg via ORAL

## 2017-08-27 MED ORDER — SODIUM CHLORIDE 0.9 % IV SOLN
Freq: Once | INTRAVENOUS | Status: AC
  Administered 2017-08-27: 09:00:00 via INTRAVENOUS

## 2017-08-27 MED ORDER — SODIUM CHLORIDE 0.9 % IV SOLN
375.0000 mg/m2 | Freq: Once | INTRAVENOUS | Status: AC
  Administered 2017-08-27: 800 mg via INTRAVENOUS
  Filled 2017-08-27: qty 30

## 2017-08-27 MED ORDER — ACETAMINOPHEN 325 MG PO TABS
650.0000 mg | ORAL_TABLET | Freq: Once | ORAL | Status: AC
  Administered 2017-08-27: 650 mg via ORAL

## 2017-08-27 MED ORDER — ACETAMINOPHEN 325 MG PO TABS
ORAL_TABLET | ORAL | Status: AC
  Filled 2017-08-27: qty 2

## 2017-08-27 MED ORDER — DIPHENHYDRAMINE HCL 25 MG PO CAPS
ORAL_CAPSULE | ORAL | Status: AC
  Filled 2017-08-27: qty 2

## 2017-08-27 NOTE — Progress Notes (Signed)
Hematology and Oncology Follow Up Visit  Richard Evans [id number] [birth year] 71 y.o. 08/27/2017 8:45 AM [name], MDVyas, [name], MD   Principle Diagnosis: 71 year old gentleman with autoimmune hemolytic anemia diagnosed on July 30 of 2017. He presented with hemoglobin of 7.5, MCV 106 with elevated reticulocyte count and haptoglobin less than 10. Coombs testing was positive. CT scan of the chest abdomen and pelvis showed nonspecific hilar adenopathy. Bone marrow biopsy obtained on 11/14/2016 confirmed the diagnosis of CLL.   Prior Therapy:   Status post packed red cell transfusion for 1 unit on 07/04/2016. Prednisone 70 mg daily started on 06/30/2016. He achieved complete response and prednisone tapered completely off on 09/11/2016. He relapsed quickly with worsening anemia. Prednisone 90 mg daily started on 09/30/2016. He achieved complete response on 10/17/2016. He completed prednisone taper on 12/18/2016.  Current therapy: Rituximab at 375 mg/m weekly. He started on 08/08/2017. He is here for week 4 of therapy.   Interim History:  Richard Evans presents today for a follow-up visit. Since the last visit, he felt weak and required 1 unit of packed red cells on 08/26/2017. After receiving his transfusion, he felt better with improved energy. He still reporting generalized fatigue and periodic tiredness. No chest pain or shortness of breath.   He continues to receive rituximab without any complications. He denied any infusion-related complications or anaphylaxis. He continues to report mild jaundice and discoloration of his urine.  He does not report any headaches, blurry vision, syncope or seizures. He is not report any fevers or chills or weight loss. He does not report any chest pain, palpitation, orthopnea or leg edema. He does not report any cough, wheezing or hemoptysis. He does not report any nausea, vomiting or abdominal pain. He does not report any frequency urgency or hesitancy. He has  not reported any skeletal complaints. Remaining review of systems unremarkable   Medications: I have reviewed the patient's current medications.  Current Outpatient Prescriptions  Medication Sig Dispense Refill  . atenolol (TENORMIN) 25 MG tablet Take 25 mg by mouth at bedtime.     . diphenhydrAMINE (BENADRYL) 25 mg capsule Take 25 mg by mouth every 6 (six) hours as needed.    . folic acid (FOLVITE) 1 MG tablet TAKE ONE TABLET BY MOUTH EVERY DAY 30 tablet 2  . metoprolol succinate (TOPROL-XL) 25 MG 24 hr tablet Take 25 mg by mouth daily. Pharmacy unable to get atenolol, taking this for now    . pantoprazole (PROTONIX) 40 MG tablet Take 40 mg by mouth daily.  1  . polyethylene glycol powder (CLEARLAX) powder Take 17 g by mouth daily as needed (constipation).    . Tamsulosin HCl (FLOMAX) 0.4 MG CAPS Take 0.4 mg by mouth at bedtime.      No current facility-administered medications for this visit.      Allergies:  Allergies  Allergen Reactions  . Adhesive [Tape] Itching and Rash    Paper tape or wrapped bandage is preferred    Past Medical History, Surgical history, Social history, and Family History were reviewed and updated.  Physical Exam: Blood pressure (!) 158/75, pulse 77, temperature 97.8 F (36.6 C), temperature source Oral, resp. rate 18, height 6' (1.829 m), weight 191 lb 8 oz (86.9 kg), SpO2 96 %. ECOG: 0 General appearance: Alert, awake gentleman appeared without distress. Head: Normocephalic, without obvious abnormality. No oral thrush noted. Scleral icterus noted and unchanged. Neck: no adenopathy Lymph nodes: Cervical, supraclavicular, and axillary nodes normal. Heart:regular rate  and rhythm, S1, S2 normal, no murmur, click, rub or gallop Lung:chest clear, no wheezing, rales, normal symmetric air entry.  Abdomin: Soft, nontender without rebound or guarding. The spleen palpable. EXT: No edema or erythema.   Lab Results: Lab Results  Component Value Date   WBC  8.4 08/27/2017   HGB 9.0 (L) 08/27/2017   HCT 25.0 (L) 08/27/2017   MCV 119.4 (H) 08/27/2017   PLT 150 08/27/2017     Chemistry      Component Value Date/Time   NA 141 08/20/2017 0759   K 3.9 08/20/2017 0759   CL 106 11/14/2016 0723   CO2 21 (L) 08/20/2017 0759   BUN 19.8 08/20/2017 0759   CREATININE 1.1 08/20/2017 0759      Component Value Date/Time   CALCIUM 9.3 08/20/2017 0759   ALKPHOS 66 08/20/2017 0759   AST 36 (H) 08/20/2017 0759   ALT 16 08/20/2017 0759   BILITOT 4.84 (HH) 08/20/2017 0759      GROSS AND MICROSCOPIC INFORMATION  Impression and Plan:   71 year old gentleman with the following issues:  1. Autoimmune hemolytic anemia presented with hemoglobin of 7.5 and positive Coombs testing. He was diagnosed In 06/30/2016.  He achieved complete response after initial prednisone therapy but relapsed after prednisone taper.  He is currently receiving rituximab without any complications. The plan is to proceed with week 4 of therapy without any dose reduction or delay.  We will continue with weekly labs and transfusion as needed till the full effect of the rituximab is in place.  2. CLL: Diagnosed in December 2014. It is likely the etiology of his autoimmune hemolytic anemia. He presented with very little to no adenopathy and mild splenomegaly.  Rituximab should help with treatment of his CLL. No changes in his clinical presentation.  3. Hyperglycemia: Managed by his primary care physician. He is off oral antihyperglycemic medication.  4. Splenomegaly: Related to CLL and not dramatically changed.  5. Jaundice: Appears to be related to elevated bilirubin from hemolysis rather than liver disease. We'll continue to monitor his bilirubin.  6. Follow-up: He will be for weekly follow-up for labs and an M.D. follow-up in 3 weeks.   Zola Button, MD 9/26/20188:45 AM

## 2017-08-27 NOTE — Patient Instructions (Signed)
[facility] Discharge Instructions for Patients Receiving Chemotherapy  Today you received the following chemotherapy agents Rituxan To help prevent nausea and vomiting after your treatment, we encourage you to take your nausea medication as prescribed.  If you develop nausea and vomiting that is not controlled by your nausea medication, call the clinic.   BELOW ARE SYMPTOMS THAT SHOULD BE REPORTED IMMEDIATELY:  *FEVER GREATER THAN 100.5 F  *CHILLS WITH OR WITHOUT FEVER  NAUSEA AND VOMITING THAT IS NOT CONTROLLED WITH YOUR NAUSEA MEDICATION  *UNUSUAL SHORTNESS OF BREATH  *UNUSUAL BRUISING OR BLEEDING  TENDERNESS IN MOUTH AND THROAT WITH OR WITHOUT PRESENCE OF ULCERS  *URINARY PROBLEMS  *BOWEL PROBLEMS  UNUSUAL RASH Items with * indicate a potential emergency and should be followed up as soon as possible.  Feel free to call the clinic you have any questions or concerns. The clinic phone number is [phone].  Please show the CHEMO ALERT CARD at check-in to the Emergency Department and triage nurse.   

## 2017-08-28 ENCOUNTER — Telehealth: Payer: Self-pay

## 2017-08-28 NOTE — Telephone Encounter (Signed)
Pt called to clarify if he is to have blood on Friday 9/28. Per current schedule next lab is 10/3 with possible blood on 10/4.

## 2017-08-29 ENCOUNTER — Other Ambulatory Visit: Payer: Self-pay | Admitting: *Deleted

## 2017-08-29 ENCOUNTER — Encounter: Payer: Self-pay | Admitting: *Deleted

## 2017-08-29 ENCOUNTER — Ambulatory Visit (HOSPITAL_BASED_OUTPATIENT_CLINIC_OR_DEPARTMENT_OTHER): Payer: Medicare Other

## 2017-08-29 ENCOUNTER — Telehealth: Payer: Self-pay

## 2017-08-29 DIAGNOSIS — D649 Anemia, unspecified: Secondary | ICD-10-CM

## 2017-08-29 DIAGNOSIS — D509 Iron deficiency anemia, unspecified: Secondary | ICD-10-CM

## 2017-08-29 LAB — CBC WITH DIFFERENTIAL/PLATELET
BASO%: 0.8 % (ref 0.0–2.0)
Basophils Absolute: 0.1 10*3/uL (ref 0.0–0.1)
EOS ABS: 0.2 10*3/uL (ref 0.0–0.5)
EOS%: 1.8 % (ref 0.0–7.0)
HEMATOCRIT: 21.5 % — AB (ref 38.4–49.9)
HEMOGLOBIN: 7.9 g/dL — AB (ref 13.0–17.1)
LYMPH#: 4.5 10*3/uL — AB (ref 0.9–3.3)
LYMPH%: 40.9 % (ref 14.0–49.0)
MCH: 46 pg — ABNORMAL HIGH (ref 27.2–33.4)
MCHC: 36.7 g/dL — ABNORMAL HIGH (ref 32.0–36.0)
MCV: 125.3 fL — AB (ref 79.3–98.0)
MONO#: 0.6 10*3/uL (ref 0.1–0.9)
MONO%: 5.6 % (ref 0.0–14.0)
NEUT%: 50.9 % (ref 39.0–75.0)
NEUTROS ABS: 5.6 10*3/uL (ref 1.5–6.5)
PLATELETS: 152 10*3/uL (ref 140–400)
RBC: 1.72 10*6/uL — ABNORMAL LOW (ref 4.20–5.82)
RDW: 22.2 % — AB (ref 11.0–14.6)
WBC: 11.1 10*3/uL — AB (ref 4.0–10.3)

## 2017-08-29 LAB — TYPE AND SCREEN
ABO/RH(D): O NEG
ANTIBODY SCREEN: POSITIVE
DAT, IGG: POSITIVE
Unit division: 0
Unit division: 0

## 2017-08-29 LAB — BPAM RBC
BLOOD PRODUCT EXPIRATION DATE: 201811022359
Blood Product Expiration Date: 201811042359
ISSUE DATE / TIME: 201809251513
Unit Type and Rh: 9500
Unit Type and Rh: 9500

## 2017-08-29 LAB — PREPARE RBC (CROSSMATCH)

## 2017-08-29 LAB — TECHNOLOGIST REVIEW

## 2017-08-29 NOTE — Progress Notes (Signed)
Patient presented in clinic stating he is very weak and thinks he needs blood. Cbc done. Transfusion set up for 09-01-17 at the [facility] clinic at 8:00 am. Patient aware of date and time and knows to keep his blue band on.

## 2017-08-29 NOTE — Telephone Encounter (Signed)
Richard Evans states that he is very weak and thinks he needs blood. They are on their way, will be at [facility] about 1115.

## 2017-09-01 ENCOUNTER — Ambulatory Visit (HOSPITAL_COMMUNITY)
Admission: RE | Admit: 2017-09-01 | Discharge: 2017-09-01 | Disposition: A | Discharge status: Home / Self Care | Payer: Medicare Other | Source: Ambulatory Visit | Attending: Oncology | Admitting: Oncology

## 2017-09-01 DIAGNOSIS — D509 Iron deficiency anemia, unspecified: Secondary | ICD-10-CM | POA: Diagnosis not present

## 2017-09-01 DIAGNOSIS — D649 Anemia, unspecified: Secondary | ICD-10-CM

## 2017-09-01 MED ORDER — SODIUM CHLORIDE 0.9 % IV SOLN
250.0000 mL | Freq: Once | INTRAVENOUS | Status: AC
  Administered 2017-09-01: 250 mL via INTRAVENOUS

## 2017-09-01 MED ORDER — HEPARIN SOD (PORK) LOCK FLUSH 100 UNIT/ML IV SOLN: 500.0000 [IU] | Freq: Every day | INTRAVENOUS | Status: DC | PRN

## 2017-09-01 MED ORDER — DIPHENHYDRAMINE HCL 25 MG PO CAPS: 25.0000 mg | ORAL_CAPSULE | Freq: Once | ORAL | Status: DC

## 2017-09-01 MED ORDER — ACETAMINOPHEN 325 MG PO TABS: 650.0000 mg | ORAL_TABLET | Freq: Once | ORAL | Status: DC

## 2017-09-01 MED ORDER — SODIUM CHLORIDE 0.9 % IV SOLN: Freq: Once | INTRAVENOUS | Status: DC

## 2017-09-01 MED ORDER — DIPHENHYDRAMINE HCL 25 MG PO CAPS
25.0000 mg | ORAL_CAPSULE | Freq: Once | ORAL | Status: AC
  Administered 2017-09-01: 25 mg via ORAL
  Filled 2017-09-01: qty 1

## 2017-09-01 MED ORDER — SODIUM CHLORIDE 0.9% FLUSH: 10.0000 mL | INTRAVENOUS | Status: DC | PRN

## 2017-09-01 MED ORDER — ACETAMINOPHEN 325 MG PO TABS
650.0000 mg | ORAL_TABLET | Freq: Once | ORAL | Status: AC
  Administered 2017-09-01: 650 mg via ORAL
  Filled 2017-09-01: qty 2

## 2017-09-01 NOTE — Progress Notes (Signed)
Provider: Dahlia Byes  Diagnosis Association: Iron deficiency anemia, unspecified iron deficiency anemia type (D50.9)  Treatment: 1 units of PRBC's via IVPB  Patient tolerated procedure well with no transfusion reaction. Patient was scheduled for 2 units of blood but only one unit was available. Blood had to come from [place]. Patient typed and crossed matched again today 10/1. Blue blood bank bractlet placed on patient and patient advised not to remove. Patient placed on the schedule at the Patient [place] for 1 unit on 10/2 @ 8:00 am. Patient stated an understanding.  Patient alert, oriented and ambulatory to wheelchair at time of discharge.

## 2017-09-01 NOTE — Discharge Instructions (Signed)

## 2017-09-02 ENCOUNTER — Ambulatory Visit (HOSPITAL_COMMUNITY)
Admission: RE | Admit: 2017-09-02 | Discharge: 2017-09-02 | Disposition: A | Discharge status: Home / Self Care | Payer: Medicare Other | Source: Ambulatory Visit | Attending: Oncology | Admitting: Oncology

## 2017-09-02 DIAGNOSIS — D509 Iron deficiency anemia, unspecified: Secondary | ICD-10-CM | POA: Diagnosis not present

## 2017-09-02 LAB — BPAM RBC
BLOOD PRODUCT EXPIRATION DATE: 201811022359
ISSUE DATE / TIME: 201810010822
UNIT TYPE AND RH: 9500

## 2017-09-02 LAB — TYPE AND SCREEN
ABO/RH(D): O NEG
Antibody Screen: POSITIVE
DAT, IGG: POSITIVE
Unit division: 0

## 2017-09-02 MED ORDER — ACETAMINOPHEN 325 MG PO TABS
650.0000 mg | ORAL_TABLET | Freq: Once | ORAL | Status: AC
  Administered 2017-09-02: 650 mg via ORAL
  Filled 2017-09-02: qty 2

## 2017-09-02 MED ORDER — DIPHENHYDRAMINE HCL 25 MG PO CAPS
25.0000 mg | ORAL_CAPSULE | Freq: Once | ORAL | Status: AC
  Administered 2017-09-02: 25 mg via ORAL
  Filled 2017-09-02: qty 1

## 2017-09-02 MED ORDER — SODIUM CHLORIDE 0.9 % IV SOLN
Freq: Once | INTRAVENOUS | Status: AC
  Administered 2017-09-02: 09:00:00 via INTRAVENOUS

## 2017-09-02 NOTE — Progress Notes (Signed)
Provider: Dahlia Byes  Diagnosis Association: Iron deficiency anemia, unspecified iron deficiency anemia type (D50.9)  Treatment: 1 units of PRBC's via IVPB  Patient tolerated procedure well with no transfusion reaction. Second unit transfused today.  Patient alert, oriented and ambulatory to wheelchair at time of discharge.

## 2017-09-03 ENCOUNTER — Other Ambulatory Visit (HOSPITAL_BASED_OUTPATIENT_CLINIC_OR_DEPARTMENT_OTHER): Payer: Medicare Other

## 2017-09-03 ENCOUNTER — Encounter: Payer: Self-pay | Admitting: *Deleted

## 2017-09-03 DIAGNOSIS — D599 Acquired hemolytic anemia, unspecified: Secondary | ICD-10-CM

## 2017-09-03 DIAGNOSIS — D649 Anemia, unspecified: Secondary | ICD-10-CM

## 2017-09-03 DIAGNOSIS — C9110 Chronic lymphocytic leukemia of B-cell type not having achieved remission: Secondary | ICD-10-CM | POA: Diagnosis not present

## 2017-09-03 LAB — COMPREHENSIVE METABOLIC PANEL
ALT: 33 U/L (ref 0–55)
AST: 47 U/L — AB (ref 5–34)
Albumin: 4.1 g/dL (ref 3.5–5.0)
Alkaline Phosphatase: 71 U/L (ref 40–150)
Anion Gap: 9 mEq/L (ref 3–11)
BUN: 27.9 mg/dL — AB (ref 7.0–26.0)
CALCIUM: 8.9 mg/dL (ref 8.4–10.4)
CHLORIDE: 111 meq/L — AB (ref 98–109)
CO2: 20 meq/L — AB (ref 22–29)
Creatinine: 1.1 mg/dL (ref 0.7–1.3)
EGFR: 70 mL/min/{1.73_m2} — ABNORMAL LOW (ref >90)
GLUCOSE: 217 mg/dL — AB (ref 70–140)
POTASSIUM: 3.9 meq/L (ref 3.5–5.1)
SODIUM: 140 meq/L (ref 136–145)
Total Bilirubin: 5.74 mg/dL (ref 0.20–1.20)
Total Protein: 8 g/dL (ref 6.4–8.3)

## 2017-09-03 LAB — TYPE AND SCREEN
ABO/RH(D): O NEG
Antibody Screen: POSITIVE
DAT, IGG: POSITIVE
Unit division: 0

## 2017-09-03 LAB — BPAM RBC
BLOOD PRODUCT EXPIRATION DATE: 201811092359
ISSUE DATE / TIME: 201810020823
UNIT TYPE AND RH: 9500

## 2017-09-03 LAB — CBC WITH DIFFERENTIAL/PLATELET
BASO%: 0.7 % (ref 0.0–2.0)
BASOS ABS: 0.1 10*3/uL (ref 0.0–0.1)
EOS%: 2.3 % (ref 0.0–7.0)
Eosinophils Absolute: 0.3 10*3/uL (ref 0.0–0.5)
HCT: 27.9 % — ABNORMAL LOW (ref 38.4–49.9)
HGB: 9.4 g/dL — ABNORMAL LOW (ref 13.0–17.1)
LYMPH%: 49.3 % — AB (ref 14.0–49.0)
MCH: 39.6 pg — AB (ref 27.2–33.4)
MCHC: 33.9 g/dL (ref 32.0–36.0)
MCV: 116.9 fL — ABNORMAL HIGH (ref 79.3–98.0)
MONO#: 0.7 10*3/uL (ref 0.1–0.9)
MONO%: 5.1 % (ref 0.0–14.0)
NEUT#: 6 10*3/uL (ref 1.5–6.5)
NEUT%: 42.6 % (ref 39.0–75.0)
Platelets: 146 10*3/uL (ref 140–400)
RBC: 2.39 10*6/uL — AB (ref 4.20–5.82)
RDW: 26.3 % — AB (ref 11.0–14.6)
WBC: 14.1 10*3/uL — AB (ref 4.0–10.3)
lymph#: 7 10*3/uL — ABNORMAL HIGH (ref 0.9–3.3)

## 2017-09-03 LAB — TECHNOLOGIST REVIEW

## 2017-09-10 ENCOUNTER — Other Ambulatory Visit: Payer: Self-pay | Admitting: *Deleted

## 2017-09-10 ENCOUNTER — Other Ambulatory Visit (HOSPITAL_BASED_OUTPATIENT_CLINIC_OR_DEPARTMENT_OTHER): Payer: Medicare Other

## 2017-09-10 ENCOUNTER — Telehealth: Payer: Self-pay | Admitting: *Deleted

## 2017-09-10 DIAGNOSIS — D509 Iron deficiency anemia, unspecified: Secondary | ICD-10-CM | POA: Diagnosis not present

## 2017-09-10 DIAGNOSIS — D599 Acquired hemolytic anemia, unspecified: Secondary | ICD-10-CM

## 2017-09-10 DIAGNOSIS — C9110 Chronic lymphocytic leukemia of B-cell type not having achieved remission: Secondary | ICD-10-CM | POA: Diagnosis not present

## 2017-09-10 DIAGNOSIS — D649 Anemia, unspecified: Secondary | ICD-10-CM

## 2017-09-10 LAB — CBC WITH DIFFERENTIAL/PLATELET
BASO%: 0.4 % (ref 0.0–2.0)
Basophils Absolute: 0 10*3/uL (ref 0.0–0.1)
EOS%: 1.8 % (ref 0.0–7.0)
Eosinophils Absolute: 0.2 10*3/uL (ref 0.0–0.5)
HCT: 20.1 % — ABNORMAL LOW (ref 38.4–49.9)
HGB: 6.3 g/dL — CL (ref 13.0–17.1)
LYMPH%: 43.8 % (ref 14.0–49.0)
MCH: 38.2 pg — ABNORMAL HIGH (ref 27.2–33.4)
MCHC: 31.3 g/dL — AB (ref 32.0–36.0)
MCV: 121.8 fL — ABNORMAL HIGH (ref 79.3–98.0)
MONO#: 0.8 10*3/uL (ref 0.1–0.9)
MONO%: 6.8 % (ref 0.0–14.0)
NEUT%: 47.2 % (ref 39.0–75.0)
NEUTROS ABS: 5.3 10*3/uL (ref 1.5–6.5)
Platelets: 135 10*3/uL — ABNORMAL LOW (ref 140–400)
RBC: 1.65 10*6/uL — AB (ref 4.20–5.82)
RDW: 26.8 % — ABNORMAL HIGH (ref 11.0–14.6)
WBC: 11.3 10*3/uL — AB (ref 4.0–10.3)
lymph#: 4.9 10*3/uL — ABNORMAL HIGH (ref 0.9–3.3)
nRBC: 3 % — ABNORMAL HIGH (ref 0–0)

## 2017-09-10 LAB — COMPREHENSIVE METABOLIC PANEL
ALT: 31 U/L (ref 0–55)
AST: 44 U/L — AB (ref 5–34)
Albumin: 3.9 g/dL (ref 3.5–5.0)
Alkaline Phosphatase: 65 U/L (ref 40–150)
Anion Gap: 8 mEq/L (ref 3–11)
BUN: 24.7 mg/dL (ref 7.0–26.0)
CHLORIDE: 112 meq/L — AB (ref 98–109)
CO2: 20 meq/L — AB (ref 22–29)
CREATININE: 1 mg/dL (ref 0.7–1.3)
Calcium: 8.8 mg/dL (ref 8.4–10.4)
EGFR: >60 mL/min/{1.73_m2} (ref >60)
Glucose: 219 mg/dl — ABNORMAL HIGH (ref 70–140)
Potassium: 4 mEq/L (ref 3.5–5.1)
Sodium: 141 mEq/L (ref 136–145)
Total Bilirubin: 5.19 mg/dL (ref 0.20–1.20)
Total Protein: 7.4 g/dL (ref 6.4–8.3)

## 2017-09-10 LAB — TECHNOLOGIST REVIEW

## 2017-09-10 LAB — PREPARE RBC (CROSSMATCH)

## 2017-09-10 NOTE — Telephone Encounter (Signed)
Called and spoke to wife that I received a call from [place], in blood bank, and Richard Evans's blood will be in [facility] from [place]. Instructed her to come to [facility] at 8:30 am for his scheduled appointment. She verbalized understanding.

## 2017-09-11 ENCOUNTER — Ambulatory Visit (HOSPITAL_BASED_OUTPATIENT_CLINIC_OR_DEPARTMENT_OTHER): Payer: Medicare Other

## 2017-09-11 DIAGNOSIS — D509 Iron deficiency anemia, unspecified: Secondary | ICD-10-CM | POA: Diagnosis not present

## 2017-09-11 DIAGNOSIS — D649 Anemia, unspecified: Secondary | ICD-10-CM | POA: Diagnosis present

## 2017-09-11 MED ORDER — ACETAMINOPHEN 325 MG PO TABS
650.0000 mg | ORAL_TABLET | Freq: Once | ORAL | Status: AC
  Administered 2017-09-11: 650 mg via ORAL

## 2017-09-11 MED ORDER — DIPHENHYDRAMINE HCL 25 MG PO CAPS
25.0000 mg | ORAL_CAPSULE | Freq: Once | ORAL | Status: AC
  Administered 2017-09-11: 25 mg via ORAL

## 2017-09-11 MED ORDER — SODIUM CHLORIDE 0.9 % IV SOLN
250.0000 mL | Freq: Once | INTRAVENOUS | Status: AC
  Administered 2017-09-11: 250 mL via INTRAVENOUS

## 2017-09-11 MED ORDER — DIPHENHYDRAMINE HCL 25 MG PO CAPS
ORAL_CAPSULE | ORAL | Status: AC
  Filled 2017-09-11: qty 1

## 2017-09-11 MED ORDER — ACETAMINOPHEN 325 MG PO TABS
ORAL_TABLET | ORAL | Status: AC
  Filled 2017-09-11: qty 2

## 2017-09-11 NOTE — Patient Instructions (Signed)

## 2017-09-12 LAB — TYPE AND SCREEN
ABO/RH(D): O NEG
ANTIBODY SCREEN: POSITIVE
DAT, IgG: POSITIVE
UNIT DIVISION: 0
Unit division: 0

## 2017-09-12 LAB — BPAM RBC
BLOOD PRODUCT EXPIRATION DATE: 201811082359
Blood Product Expiration Date: 201811162359
ISSUE DATE / TIME: 201810111142
ISSUE DATE / TIME: 201810110926
UNIT TYPE AND RH: 9500
Unit Type and Rh: 9500

## 2017-09-17 ENCOUNTER — Telehealth: Payer: Self-pay | Admitting: Oncology

## 2017-09-17 ENCOUNTER — Ambulatory Visit (HOSPITAL_BASED_OUTPATIENT_CLINIC_OR_DEPARTMENT_OTHER): Payer: Medicare Other | Admitting: Oncology

## 2017-09-17 ENCOUNTER — Other Ambulatory Visit (HOSPITAL_BASED_OUTPATIENT_CLINIC_OR_DEPARTMENT_OTHER): Payer: Medicare Other

## 2017-09-17 ENCOUNTER — Telehealth: Payer: Self-pay | Admitting: *Deleted

## 2017-09-17 ENCOUNTER — Other Ambulatory Visit: Payer: Self-pay | Admitting: *Deleted

## 2017-09-17 VITALS — BP 139/58 | HR 90 | Temp 98.7°F | Resp 17 | Ht 72.0 in | Wt 188.9 lb

## 2017-09-17 DIAGNOSIS — R17 Unspecified jaundice: Secondary | ICD-10-CM | POA: Diagnosis not present

## 2017-09-17 DIAGNOSIS — C9110 Chronic lymphocytic leukemia of B-cell type not having achieved remission: Secondary | ICD-10-CM | POA: Diagnosis not present

## 2017-09-17 DIAGNOSIS — D509 Iron deficiency anemia, unspecified: Secondary | ICD-10-CM | POA: Diagnosis not present

## 2017-09-17 DIAGNOSIS — R161 Splenomegaly, not elsewhere classified: Secondary | ICD-10-CM

## 2017-09-17 DIAGNOSIS — D649 Anemia, unspecified: Secondary | ICD-10-CM

## 2017-09-17 DIAGNOSIS — D599 Acquired hemolytic anemia, unspecified: Secondary | ICD-10-CM

## 2017-09-17 LAB — COMPREHENSIVE METABOLIC PANEL
ALBUMIN: 3.9 g/dL (ref 3.5–5.0)
ALK PHOS: 62 U/L (ref 40–150)
ALT: 21 U/L (ref 0–55)
ANION GAP: 9 meq/L (ref 3–11)
AST: 40 U/L — ABNORMAL HIGH (ref 5–34)
BUN: 22.6 mg/dL (ref 7.0–26.0)
CALCIUM: 8.9 mg/dL (ref 8.4–10.4)
CHLORIDE: 113 meq/L — AB (ref 98–109)
CO2: 20 mEq/L — ABNORMAL LOW (ref 22–29)
CREATININE: 1 mg/dL (ref 0.7–1.3)
EGFR: >60 mL/min/{1.73_m2} (ref >60)
Glucose: 199 mg/dl — ABNORMAL HIGH (ref 70–140)
POTASSIUM: 3.8 meq/L (ref 3.5–5.1)
Sodium: 142 mEq/L (ref 136–145)
Total Bilirubin: 5.43 mg/dL (ref 0.20–1.20)
Total Protein: 7.2 g/dL (ref 6.4–8.3)

## 2017-09-17 LAB — CBC WITH DIFFERENTIAL/PLATELET
BASO%: 0.6 % (ref 0.0–2.0)
BASOS ABS: 0.1 10*3/uL (ref 0.0–0.1)
EOS ABS: 0.2 10*3/uL (ref 0.0–0.5)
EOS%: 1.9 % (ref 0.0–7.0)
HEMATOCRIT: 19.3 % — AB (ref 38.4–49.9)
HEMOGLOBIN: 6.7 g/dL — AB (ref 13.0–17.1)
LYMPH#: 3.6 10*3/uL — AB (ref 0.9–3.3)
LYMPH%: 40.6 % (ref 14.0–49.0)
MCH: 44.9 pg — AB (ref 27.2–33.4)
MCHC: 34.9 g/dL (ref 32.0–36.0)
MCV: 128.5 fL — ABNORMAL HIGH (ref 79.3–98.0)
MONO#: 0.5 10*3/uL (ref 0.1–0.9)
MONO%: 5.2 % (ref 0.0–14.0)
NEUT#: 4.6 10*3/uL (ref 1.5–6.5)
NEUT%: 51.7 % (ref 39.0–75.0)
PLATELETS: 128 10*3/uL — AB (ref 140–400)
RBC: 1.5 10*6/uL — ABNORMAL LOW (ref 4.20–5.82)
RDW: 24.9 % — ABNORMAL HIGH (ref 11.0–14.6)
WBC: 8.9 10*3/uL (ref 4.0–10.3)
nRBC: 3 % — ABNORMAL HIGH (ref 0–0)

## 2017-09-17 LAB — TECHNOLOGIST REVIEW

## 2017-09-17 LAB — PREPARE RBC (CROSSMATCH)

## 2017-09-17 NOTE — Telephone Encounter (Signed)
Informed patients' wife  that 1 unit of blood will be ready at 8A and the other unit at 2P.  Charge nurse, [place], for 09/18/17 notified.

## 2017-09-17 NOTE — Telephone Encounter (Signed)
Scheduled appt per 10/17 los - Gave patient AVS and calender per los.

## 2017-09-17 NOTE — Progress Notes (Signed)
Hematology and Oncology Follow Up Visit  Richard Evans [id number] Jan 28, 1946 71 y.o. 09/17/2017 8:45 AM [name], MDVyas, [name], MD   Principle Diagnosis: 71 year old gentleman with autoimmune hemolytic anemia diagnosed on July 30 of 2017. He presented with hemoglobin of 7.5, MCV 106 with elevated reticulocyte count and haptoglobin less than 10. Coombs testing was positive. CT scan of the chest abdomen and pelvis showed nonspecific hilar adenopathy. Bone marrow biopsy obtained on 11/14/2016 confirmed the diagnosis of CLL.   Prior Therapy:   Status post packed red cell transfusion for 1 unit on 07/04/2016. Prednisone 70 mg daily started on 06/30/2016. He achieved complete response and prednisone tapered completely off on 09/11/2016. He relapsed quickly with worsening anemia. Prednisone 90 mg daily started on 09/30/2016. He achieved complete response on 10/17/2016. He completed prednisone taper on 12/18/2016.  Current therapy: Rituximab at 375 mg/m weekly started on 08/08/2017. He is status post 4 weeks of therapy completed on 08/27/2017.   Interim History:  Richard Evans presents today for a follow-up visit. Since the last visit, continued to be weak overall. He has been receiving packed red cell transfusion almost on a weekly basis. He completed rituximab without any complications related to the therapy. His last infusion was completed on 08/27/2017. He reports fatigue and some dyspnea on exertion. He denied any chest pain or shortness of breath at rest. He is still able to drive short distances.He continues to have discoloration of his urine. He denied any fevers, chills or recent infections. He denied any need for hospitalizations.  He does not report any headaches, blurry vision, syncope or seizures. He is not report any fevers or chills or weight loss. He does not report any chest pain, palpitation, orthopnea or leg edema. He does not report any cough, wheezing or hemoptysis. He does not  report any nausea, vomiting or abdominal pain. He does not report any frequency urgency or hesitancy. He has not reported any skeletal complaints. Remaining review of systems unremarkable   Medications: I have reviewed the patient's current medications.  Current Outpatient Prescriptions  Medication Sig Dispense Refill  . atenolol (TENORMIN) 25 MG tablet Take 25 mg by mouth at bedtime.     . diphenhydrAMINE (BENADRYL) 25 mg capsule Take 25 mg by mouth every 6 (six) hours as needed.    . folic acid (FOLVITE) 1 MG tablet TAKE ONE TABLET BY MOUTH EVERY DAY 30 tablet 2  . metoprolol succinate (TOPROL-XL) 25 MG 24 hr tablet Take 25 mg by mouth daily. Pharmacy unable to get atenolol, taking this for now    . pantoprazole (PROTONIX) 40 MG tablet Take 40 mg by mouth daily.  1  . polyethylene glycol powder (CLEARLAX) powder Take 17 g by mouth daily as needed (constipation).    . Tamsulosin HCl (FLOMAX) 0.4 MG CAPS Take 0.4 mg by mouth at bedtime.     . Vitamin D, Ergocalciferol, (DRISDOL) 50000 units CAPS capsule Take 50,000 Units by mouth every 7 (seven) days.     No current facility-administered medications for this visit.      Allergies:  Allergies  Allergen Reactions  . Adhesive [Tape] Itching and Rash    Paper tape or wrapped bandage is preferred    Past Medical History, Surgical history, Social history, and Family History were reviewed and updated.  Physical Exam: Blood pressure (!) 139/58, pulse 90, temperature 98.7 F (37.1 C), temperature source Oral, resp. rate 17, height 6' (1.829 m), weight 188 lb 14.4 oz (85.7 kg),  SpO2 (!) 84 %. ECOG: 0 General appearance: well-appearing gentleman appeared fatigued. Head: Normocephalic, without obvious abnormality. No oral ulcers. Scleral icterus noted and unchanged. Neck: no adenopathyor masses. Lymph nodes: Cervical, supraclavicular, and axillary nodes normal. Heart:regular rate and rhythm, S1, S2 normal, no murmur, click, rub or  gallop Lung:chest clear, no wheezing, rales, normal symmetric air entry.  Abdomin: Soft, nontender without rebound or guarding. The spleen palpable. EXT: No edema or erythema.   Lab Results: Lab Results  Component Value Date   WBC 8.9 09/17/2017   HGB 6.7 (LL) 09/17/2017   HCT 19.3 (L) 09/17/2017   MCV 128.5 (H) 09/17/2017   PLT 128 (L) 09/17/2017     Chemistry      Component Value Date/Time   NA 141 09/10/2017 0808   K 4.0 09/10/2017 0808   CL 106 11/14/2016 0723   CO2 20 (L) 09/10/2017 0808   BUN 24.7 09/10/2017 0808   CREATININE 1.0 09/10/2017 0808      Component Value Date/Time   CALCIUM 8.8 09/10/2017 0808   ALKPHOS 65 09/10/2017 0808   AST 44 (H) 09/10/2017 0808   ALT 31 09/10/2017 0808   BILITOT 5.19 (HH) 09/10/2017 0808      GROSS AND MICROSCOPIC INFORMATION  Impression and Plan:   71 year old gentleman with the following issues:  1. Autoimmune hemolytic anemia presented with hemoglobin of 7.5 and positive Coombs testing. He was diagnosed In 06/30/2016.  He achieved complete response after initial prednisone therapy but relapsed after prednisone taper.  He is S/P rituximab without any complications. Week 4 therapy was 08/27/2017.  His hemoglobin continues to be low with clear evidence of active hemolysis.  Options of therapy were reviewed today with the patient and his wife. These options would include retreatment with steroids, splenectomy, immunosuppressive agents such as Cytoxan, and chemotherapy to treat his CLL which include bendamustine and rituximab.   After discussion today, he was willing to try steroids again I will treat him with 60 mg total dose daily.He will continue to monitor his blood sugar periodically on this dose.In the meantime, he was agreeable to have a consultation with Gen. Surgery regarding splenectomy.  We will continue with weekly labs and transfusion as needed till the full effect of the rituximab is in place.  2. CLL:  Diagnosed in December 2014. It is likely the etiology of his autoimmune hemolytic anemia. He presented with very little to no adenopathy and mild splenomegaly.  We have discussed the role of treating the underlying condition for his autoimmune hemolytic anemia which will require combination of chemotherapy and immunotherapy. For the time being, he does not prefer this option given his overall weakness.  3. Hyperglycemia: Managed by his primary care physician. He is off oral antihyperglycemic medication. He'll continue to monitor his blood sugar on steroids.  4. Splenomegaly: Related to CLL and not dramatically changed. He will be evaluated by surgery for possible splenectomy.  5. Jaundice: Appears to be related to elevated bilirubin from hemolysis rather than liver disease. We'll continue to monitor his bilirubin.  6. Follow-up: He will be for weekly follow-up for labs and an M.D. follow-up in 2 weeks.   Zola Button, MD 10/17/20188:45 AM

## 2017-09-18 ENCOUNTER — Ambulatory Visit (HOSPITAL_BASED_OUTPATIENT_CLINIC_OR_DEPARTMENT_OTHER): Payer: Medicare Other

## 2017-09-18 DIAGNOSIS — D509 Iron deficiency anemia, unspecified: Secondary | ICD-10-CM | POA: Diagnosis not present

## 2017-09-18 DIAGNOSIS — D649 Anemia, unspecified: Secondary | ICD-10-CM

## 2017-09-18 MED ORDER — ACETAMINOPHEN 325 MG PO TABS
ORAL_TABLET | ORAL | Status: AC
  Filled 2017-09-18: qty 2

## 2017-09-18 MED ORDER — ACETAMINOPHEN 325 MG PO TABS
650.0000 mg | ORAL_TABLET | Freq: Once | ORAL | Status: AC
  Administered 2017-09-18: 650 mg via ORAL

## 2017-09-18 MED ORDER — DIPHENHYDRAMINE HCL 25 MG PO CAPS
25.0000 mg | ORAL_CAPSULE | Freq: Once | ORAL | Status: AC
  Administered 2017-09-18: 25 mg via ORAL

## 2017-09-18 MED ORDER — DIPHENHYDRAMINE HCL 25 MG PO CAPS
ORAL_CAPSULE | ORAL | Status: AC
  Filled 2017-09-18: qty 1

## 2017-09-18 MED ORDER — SODIUM CHLORIDE 0.9 % IV SOLN: INTRAVENOUS | Status: DC

## 2017-09-18 MED ORDER — SODIUM CHLORIDE 0.9 % IV SOLN
250.0000 mL | Freq: Once | INTRAVENOUS | Status: AC
  Administered 2017-09-18: 250 mL via INTRAVENOUS

## 2017-09-18 MED ORDER — DIPHENHYDRAMINE HCL 25 MG PO TABS
25.0000 mg | ORAL_TABLET | Freq: Once | ORAL | Status: AC
  Administered 2017-09-18: 25 mg via ORAL
  Filled 2017-09-18: qty 1

## 2017-09-18 MED ORDER — PREDNISONE 20 MG PO TABS: ORAL_TABLET | ORAL | 3 refills | Status: DC

## 2017-09-18 NOTE — Progress Notes (Signed)
Premedications repeated for second transfusion due to time since last dose. Patient feels well.

## 2017-09-18 NOTE — Patient Instructions (Signed)

## 2017-09-18 NOTE — Addendum Note (Signed)
Addended by: Gelin Portela on: 09/18/2017 09:46 AM   Modules accepted: Orders

## 2017-09-19 LAB — BPAM RBC
BLOOD PRODUCT EXPIRATION DATE: 201811212359
BLOOD PRODUCT EXPIRATION DATE: 201811222359
ISSUE DATE / TIME: 201810180927
ISSUE DATE / TIME: 201810181522
UNIT TYPE AND RH: 9500
UNIT TYPE AND RH: 9500

## 2017-09-19 LAB — TYPE AND SCREEN
ABO/RH(D): O NEG
Antibody Screen: POSITIVE
DAT, IGG: POSITIVE
UNIT DIVISION: 0
Unit division: 0

## 2017-09-24 ENCOUNTER — Encounter: Payer: Self-pay | Admitting: *Deleted

## 2017-09-24 ENCOUNTER — Other Ambulatory Visit (HOSPITAL_BASED_OUTPATIENT_CLINIC_OR_DEPARTMENT_OTHER): Payer: Medicare Other

## 2017-09-24 ENCOUNTER — Telehealth: Payer: Self-pay

## 2017-09-24 DIAGNOSIS — D599 Acquired hemolytic anemia, unspecified: Secondary | ICD-10-CM | POA: Diagnosis present

## 2017-09-24 DIAGNOSIS — C9110 Chronic lymphocytic leukemia of B-cell type not having achieved remission: Secondary | ICD-10-CM

## 2017-09-24 DIAGNOSIS — D649 Anemia, unspecified: Secondary | ICD-10-CM

## 2017-09-24 LAB — COMPREHENSIVE METABOLIC PANEL
ALK PHOS: 55 U/L (ref 40–150)
ALT: 42 U/L (ref 0–55)
ANION GAP: 9 meq/L (ref 3–11)
AST: 32 U/L (ref 5–34)
Albumin: 4 g/dL (ref 3.5–5.0)
BILIRUBIN TOTAL: 4.87 mg/dL — AB (ref 0.20–1.20)
BUN: 26.3 mg/dL — ABNORMAL HIGH (ref 7.0–26.0)
CO2: 22 meq/L (ref 22–29)
Calcium: 8.6 mg/dL (ref 8.4–10.4)
Chloride: 110 mEq/L — ABNORMAL HIGH (ref 98–109)
Creatinine: 1 mg/dL (ref 0.7–1.3)
Glucose: 148 mg/dl — ABNORMAL HIGH (ref 70–140)
POTASSIUM: 3.1 meq/L — AB (ref 3.5–5.1)
Sodium: 140 mEq/L (ref 136–145)
TOTAL PROTEIN: 6.6 g/dL (ref 6.4–8.3)

## 2017-09-24 LAB — CBC WITH DIFFERENTIAL/PLATELET
BASO%: 0.6 % (ref 0.0–2.0)
Basophils Absolute: 0 10*3/uL (ref 0.0–0.1)
EOS ABS: 0 10*3/uL (ref 0.0–0.5)
EOS%: 0.6 % (ref 0.0–7.0)
HCT: 27.6 % — ABNORMAL LOW (ref 38.4–49.9)
HGB: 9 g/dL — ABNORMAL LOW (ref 13.0–17.1)
LYMPH#: 3 10*3/uL (ref 0.9–3.3)
LYMPH%: 40.1 % (ref 14.0–49.0)
MCH: 36.7 pg — AB (ref 27.2–33.4)
MCHC: 32.5 g/dL (ref 32.0–36.0)
MCV: 113 fL — AB (ref 79.3–98.0)
MONO#: 0.4 10*3/uL (ref 0.1–0.9)
MONO%: 4.7 % (ref 0.0–14.0)
NEUT%: 54 % (ref 39.0–75.0)
NEUTROS ABS: 4.1 10*3/uL (ref 1.5–6.5)
PLATELETS: 116 10*3/uL — AB (ref 140–400)
RBC: 2.44 10*6/uL — AB (ref 4.20–5.82)
RDW: 24.8 % — AB (ref 11.0–14.6)
WBC: 7.5 10*3/uL (ref 4.0–10.3)

## 2017-09-24 LAB — TECHNOLOGIST REVIEW

## 2017-09-24 NOTE — Telephone Encounter (Signed)
hgb is 9 and platelets 116. Pt feels well. cancelled hold tube. Cancelled blood infusion tomorrow.

## 2017-09-25 ENCOUNTER — Encounter: Payer: Self-pay | Admitting: *Deleted

## 2017-09-25 ENCOUNTER — Encounter (HOSPITAL_COMMUNITY): Payer: Medicare Other

## 2017-10-01 ENCOUNTER — Telehealth: Payer: Self-pay | Admitting: Oncology

## 2017-10-01 ENCOUNTER — Other Ambulatory Visit (HOSPITAL_BASED_OUTPATIENT_CLINIC_OR_DEPARTMENT_OTHER): Payer: Medicare Other

## 2017-10-01 ENCOUNTER — Ambulatory Visit (HOSPITAL_BASED_OUTPATIENT_CLINIC_OR_DEPARTMENT_OTHER): Payer: Medicare Other | Admitting: Oncology

## 2017-10-01 VITALS — BP 157/61 | HR 60 | Temp 97.8°F | Resp 18 | Ht 72.0 in | Wt 190.2 lb

## 2017-10-01 DIAGNOSIS — R17 Unspecified jaundice: Secondary | ICD-10-CM

## 2017-10-01 DIAGNOSIS — D599 Acquired hemolytic anemia, unspecified: Secondary | ICD-10-CM | POA: Diagnosis not present

## 2017-10-01 DIAGNOSIS — C9110 Chronic lymphocytic leukemia of B-cell type not having achieved remission: Secondary | ICD-10-CM

## 2017-10-01 DIAGNOSIS — D649 Anemia, unspecified: Secondary | ICD-10-CM

## 2017-10-01 LAB — COMPREHENSIVE METABOLIC PANEL
ALBUMIN: 4.1 g/dL (ref 3.5–5.0)
ALK PHOS: 53 U/L (ref 40–150)
ALT: 41 U/L (ref 0–55)
ANION GAP: 8 meq/L (ref 3–11)
AST: 28 U/L (ref 5–34)
BILIRUBIN TOTAL: 3.9 mg/dL — AB (ref 0.20–1.20)
BUN: 23.5 mg/dL (ref 7.0–26.0)
CALCIUM: 8.9 mg/dL (ref 8.4–10.4)
CO2: 26 mEq/L (ref 22–29)
CREATININE: 1 mg/dL (ref 0.7–1.3)
Chloride: 107 mEq/L (ref 98–109)
EGFR: >60 mL/min/{1.73_m2} (ref >60)
Glucose: 134 mg/dl (ref 70–140)
Potassium: 3.5 mEq/L (ref 3.5–5.1)
Sodium: 142 mEq/L (ref 136–145)
TOTAL PROTEIN: 6.5 g/dL (ref 6.4–8.3)

## 2017-10-01 LAB — CBC WITH DIFFERENTIAL/PLATELET
BASO%: 0.2 % (ref 0.0–2.0)
Basophils Absolute: 0 10*3/uL (ref 0.0–0.1)
EOS%: 0.4 % (ref 0.0–7.0)
Eosinophils Absolute: 0 10*3/uL (ref 0.0–0.5)
HCT: 33.2 % — ABNORMAL LOW (ref 38.4–49.9)
HGB: 10.9 g/dL — ABNORMAL LOW (ref 13.0–17.1)
LYMPH%: 18.9 % (ref 14.0–49.0)
MCH: 35.9 pg — ABNORMAL HIGH (ref 27.2–33.4)
MCHC: 32.8 g/dL (ref 32.0–36.0)
MCV: 109.2 fL — ABNORMAL HIGH (ref 79.3–98.0)
MONO#: 0.2 10*3/uL (ref 0.1–0.9)
MONO%: 2.7 % (ref 0.0–14.0)
NEUT%: 77.8 % — AB (ref 39.0–75.0)
NEUTROS ABS: 4.4 10*3/uL (ref 1.5–6.5)
Platelets: 122 10*3/uL — ABNORMAL LOW (ref 140–400)
RBC: 3.04 10*6/uL — ABNORMAL LOW (ref 4.20–5.82)
RDW: 18.7 % — ABNORMAL HIGH (ref 11.0–14.6)
WBC: 5.6 10*3/uL (ref 4.0–10.3)
lymph#: 1.1 10*3/uL (ref 0.9–3.3)

## 2017-10-01 NOTE — Progress Notes (Signed)
Hematology and Oncology Follow Up Visit  Richard Evans [id number] June 28, 1946 71 y.o. 10/01/2017 8:50 AM Richard Evans, MDVyas, [name], MD   Principle Diagnosis: 71 year old gentleman with autoimmune hemolytic anemia diagnosed on July 30 of 2017. He presented with hemoglobin of 7.5, MCV 106 with elevated reticulocyte count and haptoglobin less than 10. Coombs testing was positive. CT scan of the chest abdomen and pelvis showed nonspecific hilar adenopathy. Bone marrow biopsy obtained on 11/14/2016 confirmed the diagnosis of CLL.   Prior Therapy:   Status post packed red cell transfusion for 1 unit on 07/04/2016. Prednisone 70 mg daily started on 06/30/2016. He achieved complete response and prednisone tapered completely off on 09/11/2016. He relapsed quickly with worsening anemia. Prednisone 90 mg daily started on 09/30/2016. He achieved complete response on 10/17/2016. He completed prednisone taper on 12/18/2016. Rituximab at 375 mg/m weekly started on 08/08/2017. He is status post 4 weeks of therapy completed on 08/27/2017.  Current therapy: Prednisone 60 mg daily started on 09/17/2017.   Interim History:  Mr. Mitro presents today for a follow-up visit. Since the last visit, he started prednisone at 60 mg daily and tolerated it well.  He had an excellent response to this therapy with improvement in his performance status, activity level as well as decrease in his jaundice.  He is more active at this time and quality of life is much improved.  He denies any dyspnea on exertion or shortness of breath.  He denies any hypoglycemia episodes and monitors blood sugar very closely.   He does not report any headaches, blurry vision, syncope or seizures. He is not report any fevers or chills or weight loss. He does not report any chest pain, palpitation, orthopnea or leg edema. He does not report any cough, wheezing or hemoptysis. He does not report any nausea, vomiting or abdominal pain. He does not  report any frequency urgency or hesitancy. He has not reported any skeletal complaints. Remaining review of systems unremarkable   Medications: I have reviewed the patient's current medications.  Current Outpatient Prescriptions  Medication Sig Dispense Refill  . atenolol (TENORMIN) 25 MG tablet Take 25 mg by mouth at bedtime.     . diphenhydrAMINE (BENADRYL) 25 mg capsule Take 25 mg by mouth every 6 (six) hours as needed.    . folic acid (FOLVITE) 1 MG tablet TAKE ONE TABLET BY MOUTH EVERY DAY 30 tablet 2  . metoprolol succinate (TOPROL-XL) 25 MG 24 hr tablet Take 25 mg by mouth daily. Pharmacy unable to get atenolol, taking this for now    . pantoprazole (PROTONIX) 40 MG tablet Take 40 mg by mouth daily.  1  . polyethylene glycol powder (CLEARLAX) powder Take 17 g by mouth daily as needed (constipation).    . predniSONE (DELTASONE) 20 MG tablet Take three tabs daily with breakfast. 90 tablet 3  . Tamsulosin HCl (FLOMAX) 0.4 MG CAPS Take 0.4 mg by mouth at bedtime.     . Vitamin D, Ergocalciferol, (DRISDOL) 50000 units CAPS capsule Take 50,000 Units by mouth every 7 (seven) days.     No current facility-administered medications for this visit.      Allergies:  Allergies  Allergen Reactions  . Adhesive [Tape] Itching and Rash    Paper tape or wrapped bandage is preferred    Past Medical History, Surgical history, Social history, and Family History were reviewed and updated.  Physical Exam: Blood pressure (!) 157/61, pulse 60, temperature 97.8 F (36.6 C), temperature source Oral,  resp. rate 18, height 6' (1.829 m), weight 190 lb 3.2 oz (86.3 kg), SpO2 91 %. ECOG: 0 General appearance: Alert, awake gentleman appears well. Head: Normocephalic, without obvious abnormality. No oral thrush noted. Scleral icterus noted but appears to be decreasing. Neck: no adenopathyor masses. Lymph nodes: Cervical, supraclavicular, and axillary nodes normal. Heart:regular rate and rhythm, S1, S2  normal, no murmur, click, rub or gallop Lung:chest clear, no wheezing, rales, normal symmetric air entry.  Abdomin: Soft, nontender without rebound or guarding.  No shifting dullness or ascites. EXT: No edema or erythema.   Lab Results: Lab Results  Component Value Date   WBC 5.6 10/01/2017   HGB 10.9 (L) 10/01/2017   HCT 33.2 (L) 10/01/2017   MCV 109.2 (H) 10/01/2017   PLT 122 (L) 10/01/2017     Chemistry      Component Value Date/Time   NA 140 09/24/2017 0801   K 3.1 (L) 09/24/2017 0801   CL 106 11/14/2016 0723   CO2 22 09/24/2017 0801   BUN 26.3 (H) 09/24/2017 0801   CREATININE 1.0 09/24/2017 0801      Component Value Date/Time   CALCIUM 8.6 09/24/2017 0801   ALKPHOS 55 09/24/2017 0801   AST 32 09/24/2017 0801   ALT 42 09/24/2017 0801   BILITOT 4.87 (HH) 09/24/2017 0801      GROSS AND MICROSCOPIC INFORMATION  Impression and Plan:   71 year old gentleman with the following issues:  1. Autoimmune hemolytic anemia presented with hemoglobin of 7.5 and positive Coombs testing. He was diagnosed In 06/30/2016.  He achieved complete response after initial prednisone therapy but relapsed after prednisone taper.  He is S/P rituximab without any complications. Week 4 therapy was 08/27/2017 with very little response noted.  Prednisone 60 mg daily started on September 17, 2017.  He tolerated therapy very well with excellent response on his laboratory testing today.  His hemoglobin appears to be approaching normal.  The plan is to continue with prednisone at this time at 60 mg daily and taper slowly once his hemoglobin is at a normal range.  We will consider immunosuppressive therapy such as Imuran or Cytoxan when we taper the prednisone at the time.  2. CLL: Diagnosed in December 2014. It is likely the etiology of his autoimmune hemolytic anemia. He presented with very little to no adenopathy and mild splenomegaly.  We have discussed the role of treating the underlying  condition for his autoimmune hemolytic anemia which will require combination of chemotherapy and immunotherapy.  This will be considered in the future.  3. Hyperglycemia: Managed by his primary care physician. He is off oral antihyperglycemic medication.  Let sugar is manageable at this time.  4. Splenomegaly: Related to CLL and not dramatically changed. He will be evaluated by surgery for possible splenectomy.  5. Jaundice: Appears to be related to elevated bilirubin from hemolysis rather than liver disease.  His jaundice is improving as his hemolysis improving.  6. Follow-up: He will be for weekly follow-up for labs and an M.D. follow-up in 3 weeks.   Zola Button, MD 10/31/20188:50 AM

## 2017-10-01 NOTE — Telephone Encounter (Signed)
Gave avs and calendar for November and December

## 2017-10-08 ENCOUNTER — Other Ambulatory Visit: Payer: Medicare Other

## 2017-10-08 ENCOUNTER — Other Ambulatory Visit (HOSPITAL_BASED_OUTPATIENT_CLINIC_OR_DEPARTMENT_OTHER): Payer: Medicare Other

## 2017-10-08 DIAGNOSIS — D599 Acquired hemolytic anemia, unspecified: Secondary | ICD-10-CM | POA: Diagnosis not present

## 2017-10-08 DIAGNOSIS — D649 Anemia, unspecified: Secondary | ICD-10-CM

## 2017-10-08 DIAGNOSIS — C9110 Chronic lymphocytic leukemia of B-cell type not having achieved remission: Secondary | ICD-10-CM | POA: Diagnosis present

## 2017-10-08 LAB — COMPREHENSIVE METABOLIC PANEL
ALT: 29 U/L (ref 0–55)
AST: 18 U/L (ref 5–34)
Albumin: 4 g/dL (ref 3.5–5.0)
Alkaline Phosphatase: 42 U/L (ref 40–150)
Anion Gap: 9 mEq/L (ref 3–11)
BILIRUBIN TOTAL: 3.09 mg/dL — AB (ref 0.20–1.20)
BUN: 23.8 mg/dL (ref 7.0–26.0)
CHLORIDE: 107 meq/L (ref 98–109)
CO2: 25 meq/L (ref 22–29)
CREATININE: 0.9 mg/dL (ref 0.7–1.3)
Calcium: 8.9 mg/dL (ref 8.4–10.4)
EGFR: >60 mL/min/{1.73_m2} (ref >60)
GLUCOSE: 126 mg/dL (ref 70–140)
Potassium: 3.7 mEq/L (ref 3.5–5.1)
SODIUM: 141 meq/L (ref 136–145)
TOTAL PROTEIN: 6.1 g/dL — AB (ref 6.4–8.3)

## 2017-10-08 LAB — CBC WITH DIFFERENTIAL/PLATELET
BASO%: 0.5 % (ref 0.0–2.0)
BASOS ABS: 0 10*3/uL (ref 0.0–0.1)
EOS ABS: 0 10*3/uL (ref 0.0–0.5)
EOS%: 0.7 % (ref 0.0–7.0)
HCT: 35 % — ABNORMAL LOW (ref 38.4–49.9)
HEMOGLOBIN: 11.9 g/dL — AB (ref 13.0–17.1)
LYMPH%: 30.9 % (ref 14.0–49.0)
MCH: 35.6 pg — AB (ref 27.2–33.4)
MCHC: 33.8 g/dL (ref 32.0–36.0)
MCV: 105.2 fL — AB (ref 79.3–98.0)
MONO#: 0.3 10*3/uL (ref 0.1–0.9)
MONO%: 7.6 % (ref 0.0–14.0)
NEUT#: 2.2 10*3/uL (ref 1.5–6.5)
NEUT%: 60.3 % (ref 39.0–75.0)
Platelets: 115 10*3/uL — ABNORMAL LOW (ref 140–400)
RBC: 3.33 10*6/uL — ABNORMAL LOW (ref 4.20–5.82)
RDW: 15.5 % — AB (ref 11.0–14.6)
WBC: 3.6 10*3/uL — ABNORMAL LOW (ref 4.0–10.3)
lymph#: 1.1 10*3/uL (ref 0.9–3.3)

## 2017-10-10 DIAGNOSIS — D479 Neoplasm of uncertain behavior of lymphoid, hematopoietic and related tissue, unspecified: Secondary | ICD-10-CM | POA: Diagnosis not present

## 2017-10-10 DIAGNOSIS — K4090 Unilateral inguinal hernia, without obstruction or gangrene, not specified as recurrent: Secondary | ICD-10-CM | POA: Diagnosis not present

## 2017-10-10 DIAGNOSIS — D591 Other autoimmune hemolytic anemias: Secondary | ICD-10-CM | POA: Diagnosis not present

## 2017-10-15 ENCOUNTER — Other Ambulatory Visit: Payer: Self-pay | Admitting: *Deleted

## 2017-10-15 ENCOUNTER — Telehealth: Payer: Self-pay | Admitting: *Deleted

## 2017-10-15 ENCOUNTER — Other Ambulatory Visit (HOSPITAL_BASED_OUTPATIENT_CLINIC_OR_DEPARTMENT_OTHER): Payer: Medicare Other

## 2017-10-15 DIAGNOSIS — D649 Anemia, unspecified: Secondary | ICD-10-CM

## 2017-10-15 DIAGNOSIS — D599 Acquired hemolytic anemia, unspecified: Secondary | ICD-10-CM

## 2017-10-15 DIAGNOSIS — C9110 Chronic lymphocytic leukemia of B-cell type not having achieved remission: Secondary | ICD-10-CM | POA: Diagnosis not present

## 2017-10-15 LAB — COMPREHENSIVE METABOLIC PANEL
ALK PHOS: 42 U/L (ref 40–150)
ALT: 29 U/L (ref 0–55)
ANION GAP: 7 meq/L (ref 3–11)
AST: 16 U/L (ref 5–34)
Albumin: 4.1 g/dL (ref 3.5–5.0)
BILIRUBIN TOTAL: 2.6 mg/dL — AB (ref 0.20–1.20)
BUN: 29.3 mg/dL — ABNORMAL HIGH (ref 7.0–26.0)
CO2: 28 mEq/L (ref 22–29)
Calcium: 9.1 mg/dL (ref 8.4–10.4)
Chloride: 106 mEq/L (ref 98–109)
Creatinine: 1.2 mg/dL (ref 0.7–1.3)
GLUCOSE: 167 mg/dL — AB (ref 70–140)
POTASSIUM: 4 meq/L (ref 3.5–5.1)
Sodium: 141 mEq/L (ref 136–145)
Total Protein: 6.3 g/dL — ABNORMAL LOW (ref 6.4–8.3)

## 2017-10-15 LAB — CBC WITH DIFFERENTIAL/PLATELET
BASO%: 0.9 % (ref 0.0–2.0)
BASOS ABS: 0.1 10*3/uL (ref 0.0–0.1)
EOS ABS: 0 10*3/uL (ref 0.0–0.5)
EOS%: 0.7 % (ref 0.0–7.0)
HEMATOCRIT: 38 % — AB (ref 38.4–49.9)
HEMOGLOBIN: 13 g/dL (ref 13.0–17.1)
LYMPH%: 30 % (ref 14.0–49.0)
MCH: 35.1 pg — AB (ref 27.2–33.4)
MCHC: 34.2 g/dL (ref 32.0–36.0)
MCV: 102.8 fL — AB (ref 79.3–98.0)
MONO#: 0.3 10*3/uL (ref 0.1–0.9)
MONO%: 4.1 % (ref 0.0–14.0)
NEUT%: 64.3 % (ref 39.0–75.0)
NEUTROS ABS: 4.3 10*3/uL (ref 1.5–6.5)
Platelets: 95 10*3/uL — ABNORMAL LOW (ref 140–400)
RBC: 3.7 10*6/uL — ABNORMAL LOW (ref 4.20–5.82)
RDW: 14.4 % (ref 11.0–14.6)
WBC: 6.7 10*3/uL (ref 4.0–10.3)
lymph#: 2 10*3/uL (ref 0.9–3.3)

## 2017-10-15 MED ORDER — PREDNISONE 10 MG PO TABS: 10.0000 mg | ORAL_TABLET | Freq: Every day | ORAL | 1 refills | Status: DC

## 2017-10-15 NOTE — Telephone Encounter (Signed)
Per Dr. Alen Blew, I instructed the patient to decrease his Prednisone from 60 mg to 50 mg. I phoned in a prescription for  Prednisone 10 mg tablets. Patient and wife verbalized understanding.

## 2017-10-22 ENCOUNTER — Telehealth: Payer: Self-pay | Admitting: Oncology

## 2017-10-22 ENCOUNTER — Ambulatory Visit (HOSPITAL_BASED_OUTPATIENT_CLINIC_OR_DEPARTMENT_OTHER): Payer: Medicare Other | Admitting: Oncology

## 2017-10-22 ENCOUNTER — Other Ambulatory Visit (HOSPITAL_BASED_OUTPATIENT_CLINIC_OR_DEPARTMENT_OTHER): Payer: Medicare Other

## 2017-10-22 VITALS — BP 140/68 | HR 57 | Temp 97.8°F | Resp 18 | Ht 72.0 in | Wt 186.8 lb

## 2017-10-22 DIAGNOSIS — D649 Anemia, unspecified: Secondary | ICD-10-CM

## 2017-10-22 DIAGNOSIS — C9110 Chronic lymphocytic leukemia of B-cell type not having achieved remission: Secondary | ICD-10-CM | POA: Diagnosis not present

## 2017-10-22 DIAGNOSIS — D599 Acquired hemolytic anemia, unspecified: Secondary | ICD-10-CM

## 2017-10-22 LAB — CBC WITH DIFFERENTIAL/PLATELET
BASO%: 0.2 % (ref 0.0–2.0)
Basophils Absolute: 0 10*3/uL (ref 0.0–0.1)
EOS%: 0.9 % (ref 0.0–7.0)
Eosinophils Absolute: 0.1 10*3/uL (ref 0.0–0.5)
HEMATOCRIT: 37.5 % — AB (ref 38.4–49.9)
HGB: 13.1 g/dL (ref 13.0–17.1)
LYMPH#: 1.9 10*3/uL (ref 0.9–3.3)
LYMPH%: 32.9 % (ref 14.0–49.0)
MCH: 34.7 pg — ABNORMAL HIGH (ref 27.2–33.4)
MCHC: 34.9 g/dL (ref 32.0–36.0)
MCV: 99.2 fL — ABNORMAL HIGH (ref 79.3–98.0)
MONO#: 0.3 10*3/uL (ref 0.1–0.9)
MONO%: 5.1 % (ref 0.0–14.0)
NEUT%: 60.9 % (ref 39.0–75.0)
NEUTROS ABS: 3.6 10*3/uL (ref 1.5–6.5)
PLATELETS: 89 10*3/uL — AB (ref 140–400)
RBC: 3.78 10*6/uL — ABNORMAL LOW (ref 4.20–5.82)
RDW: 13.7 % (ref 11.0–14.6)
WBC: 5.8 10*3/uL (ref 4.0–10.3)

## 2017-10-22 LAB — COMPREHENSIVE METABOLIC PANEL
ALK PHOS: 44 U/L (ref 40–150)
ALT: 29 U/L (ref 0–55)
ANION GAP: 8 meq/L (ref 3–11)
AST: 16 U/L (ref 5–34)
Albumin: 4.2 g/dL (ref 3.5–5.0)
BUN: 30 mg/dL — ABNORMAL HIGH (ref 7.0–26.0)
CHLORIDE: 104 meq/L (ref 98–109)
CO2: 27 meq/L (ref 22–29)
Calcium: 9.3 mg/dL (ref 8.4–10.4)
Creatinine: 1.1 mg/dL (ref 0.7–1.3)
EGFR: >60 mL/min/{1.73_m2} (ref >60)
GLUCOSE: 166 mg/dL — AB (ref 70–140)
Potassium: 4.1 mEq/L (ref 3.5–5.1)
SODIUM: 139 meq/L (ref 136–145)
Total Bilirubin: 2.33 mg/dL — ABNORMAL HIGH (ref 0.20–1.20)
Total Protein: 6.5 g/dL (ref 6.4–8.3)

## 2017-10-22 NOTE — Progress Notes (Signed)
Hematology and Oncology Follow Up Visit  Richard Evans [id number] Apr 23, 1946 71 y.o. 10/22/2017 9:21 AM Richard Evans, MDVyas, Richard Hatcher, MD   Principle Diagnosis: 71 year old gentleman with autoimmune hemolytic anemia diagnosed on July 30 of 2017. He presented with hemoglobin of 7.5, MCV 106 with elevated reticulocyte count and haptoglobin less than 10. Coombs testing was positive. CT scan of the chest abdomen and pelvis showed nonspecific hilar adenopathy. Bone marrow biopsy obtained on 11/14/2016 confirmed the diagnosis of CLL.   Prior Therapy:   Status post packed red cell transfusion for 1 unit on 07/04/2016. Prednisone 70 mg daily started on 06/30/2016. He achieved complete response and prednisone tapered completely off on 09/11/2016. He relapsed quickly with worsening anemia. Prednisone 90 mg daily started on 09/30/2016. He achieved complete response on 10/17/2016. He completed prednisone taper on 12/18/2016. Rituximab at 375 mg/m weekly started on 08/08/2017. He is status post 4 weeks of therapy completed on 08/27/2017.  Current therapy: Prednisone 60 mg daily started on 09/17/2017.  He is currently taking 50 mg daily.   Interim History:  [name] presents today for a follow-up visit. Since the last visit, he reports no major changes in his health.  He continues to feel well after achieving excellent response on prednisone.  He is currently taking 50 mg daily in the last 7 days. He is more active at this time and quality of life is much improved.  He denies any dyspnea on exertion or shortness of breath.  He denies any hypoglycemia or diabetic complications.  He is no longer reporting coloration of his urine.   He does not report any headaches, blurry vision, syncope or seizures. He is not report any fevers or chills or weight loss. He does not report any chest pain, palpitation, orthopnea or leg edema. He does not report any cough, wheezing or hemoptysis. He does not report any nausea,  vomiting or abdominal pain. He does not report any frequency urgency or hesitancy. He has not reported any skeletal complaints. Remaining review of systems unremarkable   Medications: I have reviewed the patient's current medications.  Current Outpatient Medications  Medication Sig Dispense Refill  . atenolol (TENORMIN) 25 MG tablet Take 25 mg by mouth at bedtime.     . Cholecalciferol (VITAMIN D) 2000 units CAPS Take 1 capsule by mouth daily.    . diphenhydrAMINE (BENADRYL) 25 mg capsule Take 25 mg by mouth every 6 (six) hours as needed.    . folic acid (FOLVITE) 1 MG tablet TAKE ONE TABLET BY MOUTH EVERY DAY 30 tablet 2  . metoprolol succinate (TOPROL-XL) 25 MG 24 hr tablet Take 25 mg by mouth daily. Pharmacy unable to get atenolol, taking this for now    . pantoprazole (PROTONIX) 40 MG tablet Take 40 mg by mouth daily.  1  . polyethylene glycol powder (CLEARLAX) powder Take 17 g by mouth daily as needed (constipation).    . predniSONE (DELTASONE) 10 MG tablet Take 1 tablet (10 mg total) daily with breakfast by mouth. 90 tablet 1  . predniSONE (DELTASONE) 20 MG tablet Take three tabs daily with breakfast. 90 tablet 3  . Tamsulosin HCl (FLOMAX) 0.4 MG CAPS Take 0.4 mg by mouth at bedtime.      No current facility-administered medications for this visit.      Allergies:  Allergies  Allergen Reactions  . Adhesive [Tape] Itching and Rash    Paper tape or wrapped bandage is preferred    Past Medical History, Surgical  history, Social history, and Family History were reviewed and updated.  Physical Exam: Blood pressure 140/68, pulse (!) 57, temperature 97.8 F (36.6 C), temperature source Oral, resp. rate 18, height 6' (1.829 m), weight 186 lb 12.8 oz (84.7 kg), SpO2 95 %. ECOG: 0 General appearance: Well-appearing gentleman without distress. Head: Normocephalic, without obvious abnormality. No oral thrush or ulcers. Scleral icterus has resolved. Neck: no adenopathyor masses. Lymph  nodes: Cervical, supraclavicular, and axillary nodes normal. Heart:regular rate and rhythm, S1, S2 normal, no murmur, click, rub or gallop Lung:chest clear, no wheezing, rales, normal symmetric air entry.  Abdomin: Soft, nontender without rebound or guarding.  No shifting dullness or ascites. EXT: No edema or erythema.   Lab Results: Lab Results  Component Value Date   WBC 5.8 10/22/2017   HGB 13.1 10/22/2017   HCT 37.5 (L) 10/22/2017   MCV 99.2 (H) 10/22/2017   PLT 89 (L) 10/22/2017     Chemistry      Component Value Date/Time   NA 141 10/15/2017 0751   K 4.0 10/15/2017 0751   CL 106 11/14/2016 0723   CO2 28 10/15/2017 0751   BUN 29.3 (H) 10/15/2017 0751   CREATININE 1.2 10/15/2017 0751      Component Value Date/Time   CALCIUM 9.1 10/15/2017 0751   ALKPHOS 42 10/15/2017 0751   AST 16 10/15/2017 0751   ALT 29 10/15/2017 0751   BILITOT 2.60 (H) 10/15/2017 0751      GROSS AND MICROSCOPIC INFORMATION  Impression and Plan:   71 year old gentleman with the following issues:  1. Autoimmune hemolytic anemia presented with hemoglobin of 7.5 and positive Coombs testing. He was diagnosed In 06/30/2016.  He achieved complete response after initial prednisone therapy but relapsed after prednisone taper.  He is S/P rituximab without any complications. Week 4 therapy was 08/27/2017 with very little response noted.  Prednisone 60 mg daily started on September 17, 2017 and currently achieved complete hematological response.  He is receiving tapering doses and currently at 50 mg daily.  He is to go down to 40 mg starting on 10/23/2017.  2. CLL: Diagnosed in December 2014. It is likely the etiology of his autoimmune hemolytic anemia. He presented with very little to no adenopathy and mild splenomegaly.  He is status post rituximab treatment and his disease appears to be in active.  3. Hyperglycemia: Managed by his primary care physician. He is off oral antihyperglycemic medication.   No hypoglycemia episodes noted.  4. Splenomegaly: Related to CLL and not dramatically changed.  He was evaluated by Dr. Excell Seltzer for possible splenectomy.  He would be an excellent candidate and he will consider that option in the future.  5. Jaundice: Improving after his hemolysis has improved.  6. Follow-up: He will be for weekly follow-up for labs and an M.D. follow-up in 4 weeks.   Zola Button, MD 11/21/20189:21 AM

## 2017-10-22 NOTE — Telephone Encounter (Signed)
Scheduled appt per 11/21 los - Gave patient AVS and calender per los.  

## 2017-10-26 ENCOUNTER — Other Ambulatory Visit: Payer: Self-pay | Admitting: Oncology

## 2017-10-29 ENCOUNTER — Telehealth: Payer: Self-pay | Admitting: *Deleted

## 2017-10-29 ENCOUNTER — Other Ambulatory Visit (HOSPITAL_BASED_OUTPATIENT_CLINIC_OR_DEPARTMENT_OTHER): Payer: Medicare Other

## 2017-10-29 DIAGNOSIS — D599 Acquired hemolytic anemia, unspecified: Secondary | ICD-10-CM | POA: Diagnosis present

## 2017-10-29 DIAGNOSIS — C9110 Chronic lymphocytic leukemia of B-cell type not having achieved remission: Secondary | ICD-10-CM

## 2017-10-29 DIAGNOSIS — D649 Anemia, unspecified: Secondary | ICD-10-CM

## 2017-10-29 LAB — CBC WITH DIFFERENTIAL/PLATELET
BASO%: 0.3 % (ref 0.0–2.0)
BASOS ABS: 0 10*3/uL (ref 0.0–0.1)
EOS%: 1.5 % (ref 0.0–7.0)
Eosinophils Absolute: 0.1 10*3/uL (ref 0.0–0.5)
HCT: 37.3 % — ABNORMAL LOW (ref 38.4–49.9)
HEMOGLOBIN: 13.3 g/dL (ref 13.0–17.1)
LYMPH%: 41.5 % (ref 14.0–49.0)
MCH: 34.9 pg — AB (ref 27.2–33.4)
MCHC: 35.7 g/dL (ref 32.0–36.0)
MCV: 97.9 fL (ref 79.3–98.0)
MONO#: 0.3 10*3/uL (ref 0.1–0.9)
MONO%: 7.5 % (ref 0.0–14.0)
NEUT#: 2 10*3/uL (ref 1.5–6.5)
NEUT%: 49.2 % (ref 39.0–75.0)
Platelets: 102 10*3/uL — ABNORMAL LOW (ref 140–400)
RBC: 3.81 10*6/uL — ABNORMAL LOW (ref 4.20–5.82)
RDW: 13.7 % (ref 11.0–14.6)
WBC: 4 10*3/uL (ref 4.0–10.3)
lymph#: 1.7 10*3/uL (ref 0.9–3.3)
nRBC: 0 % (ref 0–0)

## 2017-10-29 LAB — COMPREHENSIVE METABOLIC PANEL
ALBUMIN: 4.3 g/dL (ref 3.5–5.0)
ALK PHOS: 46 U/L (ref 40–150)
ALT: 33 U/L (ref 0–55)
AST: 18 U/L (ref 5–34)
Anion Gap: 9 mEq/L (ref 3–11)
BILIRUBIN TOTAL: 1.97 mg/dL — AB (ref 0.20–1.20)
BUN: 37.9 mg/dL — ABNORMAL HIGH (ref 7.0–26.0)
CO2: 25 mEq/L (ref 22–29)
Calcium: 9.3 mg/dL (ref 8.4–10.4)
Chloride: 107 mEq/L (ref 98–109)
Creatinine: 1.1 mg/dL (ref 0.7–1.3)
EGFR: >60 mL/min/{1.73_m2} (ref >60)
GLUCOSE: 177 mg/dL — AB (ref 70–140)
POTASSIUM: 4 meq/L (ref 3.5–5.1)
SODIUM: 140 meq/L (ref 136–145)
TOTAL PROTEIN: 6.7 g/dL (ref 6.4–8.3)

## 2017-10-29 NOTE — Telephone Encounter (Signed)
  Per Dr. Alen Blew, I informed wife of lab results. Hemoglobin is 13.3 and no blood transfusion is needed. Starting tomorrow, 10/30/17, decrease prednisone to 30 mg once a day. Wife verbalized understanding.

## 2017-11-05 ENCOUNTER — Other Ambulatory Visit (HOSPITAL_BASED_OUTPATIENT_CLINIC_OR_DEPARTMENT_OTHER): Payer: Medicare Other

## 2017-11-05 ENCOUNTER — Encounter: Payer: Self-pay | Admitting: *Deleted

## 2017-11-05 DIAGNOSIS — D599 Acquired hemolytic anemia, unspecified: Secondary | ICD-10-CM | POA: Diagnosis present

## 2017-11-05 DIAGNOSIS — C9110 Chronic lymphocytic leukemia of B-cell type not having achieved remission: Secondary | ICD-10-CM

## 2017-11-05 DIAGNOSIS — D649 Anemia, unspecified: Secondary | ICD-10-CM

## 2017-11-05 LAB — COMPREHENSIVE METABOLIC PANEL
ALBUMIN: 3.9 g/dL (ref 3.5–5.0)
ALK PHOS: 46 U/L (ref 40–150)
ALT: 45 U/L (ref 0–55)
AST: 22 U/L (ref 5–34)
Anion Gap: 9 mEq/L (ref 3–11)
BILIRUBIN TOTAL: 2.08 mg/dL — AB (ref 0.20–1.20)
BUN: 31.9 mg/dL — AB (ref 7.0–26.0)
CO2: 23 meq/L (ref 22–29)
CREATININE: 1 mg/dL (ref 0.7–1.3)
Calcium: 9 mg/dL (ref 8.4–10.4)
Chloride: 106 mEq/L (ref 98–109)
EGFR: >60 mL/min/{1.73_m2} (ref >60)
GLUCOSE: 217 mg/dL — AB (ref 70–140)
Potassium: 3.9 mEq/L (ref 3.5–5.1)
SODIUM: 138 meq/L (ref 136–145)
TOTAL PROTEIN: 6.6 g/dL (ref 6.4–8.3)

## 2017-11-05 LAB — CBC WITH DIFFERENTIAL/PLATELET
BASO%: 0 % (ref 0.0–2.0)
BASOS ABS: 0 10*3/uL (ref 0.0–0.1)
EOS%: 3.8 % (ref 0.0–7.0)
Eosinophils Absolute: 0.1 10*3/uL (ref 0.0–0.5)
HCT: 35.7 % — ABNORMAL LOW (ref 38.4–49.9)
HGB: 12.4 g/dL — ABNORMAL LOW (ref 13.0–17.1)
LYMPH%: 59.5 % — AB (ref 14.0–49.0)
MCH: 33.3 pg (ref 27.2–33.4)
MCHC: 34.7 g/dL (ref 32.0–36.0)
MCV: 96 fL (ref 79.3–98.0)
MONO#: 0.2 10*3/uL (ref 0.1–0.9)
MONO%: 8 % (ref 0.0–14.0)
NEUT#: 0.7 10*3/uL — ABNORMAL LOW (ref 1.5–6.5)
NEUT%: 28.7 % — AB (ref 39.0–75.0)
Platelets: 103 10*3/uL — ABNORMAL LOW (ref 140–400)
RBC: 3.72 10*6/uL — AB (ref 4.20–5.82)
RDW: 13.9 % (ref 11.0–14.6)
WBC: 2.4 10*3/uL — ABNORMAL LOW (ref 4.0–10.3)
lymph#: 1.4 10*3/uL (ref 0.9–3.3)

## 2017-11-06 DIAGNOSIS — Z6826 Body mass index (BMI) 26.0-26.9, adult: Secondary | ICD-10-CM | POA: Diagnosis not present

## 2017-11-06 DIAGNOSIS — Z299 Encounter for prophylactic measures, unspecified: Secondary | ICD-10-CM | POA: Diagnosis not present

## 2017-11-06 DIAGNOSIS — J42 Unspecified chronic bronchitis: Secondary | ICD-10-CM | POA: Diagnosis not present

## 2017-11-06 DIAGNOSIS — I10 Essential (primary) hypertension: Secondary | ICD-10-CM | POA: Diagnosis not present

## 2017-11-06 DIAGNOSIS — R05 Cough: Secondary | ICD-10-CM | POA: Diagnosis not present

## 2017-11-06 DIAGNOSIS — J069 Acute upper respiratory infection, unspecified: Secondary | ICD-10-CM | POA: Diagnosis not present

## 2017-11-06 DIAGNOSIS — R739 Hyperglycemia, unspecified: Secondary | ICD-10-CM | POA: Diagnosis not present

## 2017-11-06 DIAGNOSIS — I700 Atherosclerosis of aorta: Secondary | ICD-10-CM | POA: Diagnosis not present

## 2017-11-12 ENCOUNTER — Other Ambulatory Visit (HOSPITAL_BASED_OUTPATIENT_CLINIC_OR_DEPARTMENT_OTHER): Payer: Medicare Other

## 2017-11-12 ENCOUNTER — Other Ambulatory Visit: Payer: Self-pay | Admitting: Oncology

## 2017-11-12 DIAGNOSIS — D599 Acquired hemolytic anemia, unspecified: Secondary | ICD-10-CM

## 2017-11-12 DIAGNOSIS — C8590 Non-Hodgkin lymphoma, unspecified, unspecified site: Secondary | ICD-10-CM

## 2017-11-12 DIAGNOSIS — C9110 Chronic lymphocytic leukemia of B-cell type not having achieved remission: Secondary | ICD-10-CM | POA: Diagnosis not present

## 2017-11-12 DIAGNOSIS — D649 Anemia, unspecified: Secondary | ICD-10-CM

## 2017-11-12 LAB — COMPREHENSIVE METABOLIC PANEL
ALBUMIN: 3.6 g/dL (ref 3.5–5.0)
ALK PHOS: 45 U/L (ref 40–150)
ALT: 40 U/L (ref 0–55)
AST: 23 U/L (ref 5–34)
Anion Gap: 11 mEq/L (ref 3–11)
BUN: 36.1 mg/dL — AB (ref 7.0–26.0)
CO2: 21 meq/L — AB (ref 22–29)
Calcium: 9 mg/dL (ref 8.4–10.4)
Chloride: 105 mEq/L (ref 98–109)
Creatinine: 1.1 mg/dL (ref 0.7–1.3)
GLUCOSE: 237 mg/dL — AB (ref 70–140)
POTASSIUM: 4.4 meq/L (ref 3.5–5.1)
SODIUM: 137 meq/L (ref 136–145)
Total Bilirubin: 1.84 mg/dL — ABNORMAL HIGH (ref 0.20–1.20)
Total Protein: 6.6 g/dL (ref 6.4–8.3)

## 2017-11-12 LAB — CBC WITH DIFFERENTIAL/PLATELET
BASO%: 0.6 % (ref 0.0–2.0)
BASOS ABS: 0 10*3/uL (ref 0.0–0.1)
EOS ABS: 0.1 10*3/uL (ref 0.0–0.5)
EOS%: 3.8 % (ref 0.0–7.0)
HEMATOCRIT: 35.1 % — AB (ref 38.4–49.9)
HGB: 11.8 g/dL — ABNORMAL LOW (ref 13.0–17.1)
LYMPH#: 0.9 10*3/uL (ref 0.9–3.3)
LYMPH%: 57.9 % — AB (ref 14.0–49.0)
MCH: 32.3 pg (ref 27.2–33.4)
MCHC: 33.6 g/dL (ref 32.0–36.0)
MCV: 96.2 fL (ref 79.3–98.0)
MONO#: 0.2 10*3/uL (ref 0.1–0.9)
MONO%: 15.1 % — ABNORMAL HIGH (ref 0.0–14.0)
NEUT#: 0.4 10*3/uL — CL (ref 1.5–6.5)
NEUT%: 22.6 % — AB (ref 39.0–75.0)
PLATELETS: 139 10*3/uL — AB (ref 140–400)
RBC: 3.65 10*6/uL — AB (ref 4.20–5.82)
RDW: 14.9 % — ABNORMAL HIGH (ref 11.0–14.6)
WBC: 1.6 10*3/uL — ABNORMAL LOW (ref 4.0–10.3)

## 2017-11-12 NOTE — Progress Notes (Signed)
Labs reviewed today and discussed with the patient.  He is neutropenic and currently dealing with an episode of bronchitis.  He was seen by his primary care physician and was started on Levaquin.  He still reports nonproductive cough and slight dyspnea on exertion.  He denies any fever above 99.0.  The plan is to restaging with CT scan chest abdomen and pelvis and I gave him clear instructions to report to the emergency department if he develops any fevers, chills worsening shortness of breath or if he does not improve in the next 72 hours.  He has MD follow-up in 1 week to discuss the results of the CT scan and follow his progress.  He will continue prednisone taper for the time being.

## 2017-11-18 ENCOUNTER — Telehealth: Payer: Self-pay | Admitting: *Deleted

## 2017-11-18 ENCOUNTER — Ambulatory Visit (HOSPITAL_COMMUNITY)
Admission: RE | Admit: 2017-11-18 | Discharge: 2017-11-18 | Disposition: A | Discharge status: Home / Self Care | Payer: Medicare Other | Source: Ambulatory Visit | Attending: Oncology | Admitting: Oncology

## 2017-11-18 ENCOUNTER — Encounter (HOSPITAL_COMMUNITY): Payer: Self-pay

## 2017-11-18 DIAGNOSIS — C8590 Non-Hodgkin lymphoma, unspecified, unspecified site: Secondary | ICD-10-CM

## 2017-11-18 DIAGNOSIS — R161 Splenomegaly, not elsewhere classified: Secondary | ICD-10-CM | POA: Insufficient documentation

## 2017-11-18 DIAGNOSIS — I700 Atherosclerosis of aorta: Secondary | ICD-10-CM | POA: Insufficient documentation

## 2017-11-18 DIAGNOSIS — R911 Solitary pulmonary nodule: Secondary | ICD-10-CM | POA: Diagnosis not present

## 2017-11-18 MED ORDER — IOPAMIDOL (ISOVUE-300) INJECTION 61%
100.0000 mL | Freq: Once | INTRAVENOUS | Status: AC | PRN
  Administered 2017-11-18: 100 mL via INTRAVENOUS

## 2017-11-18 MED ORDER — IOPAMIDOL (ISOVUE-300) INJECTION 61%
INTRAVENOUS | Status: AC
  Administered 2017-11-18: 100 mL via INTRAVENOUS
  Filled 2017-11-18: qty 100

## 2017-11-18 NOTE — Telephone Encounter (Signed)
-----   Message from Tortorelli Portela, MD sent at 11/18/2017 12:27 PM EST ----- Please let him know his scan is unchanged.

## 2017-11-18 NOTE — Telephone Encounter (Signed)
Spoke with wife, per dr Alen Blew, ct scan unchanged

## 2017-11-19 ENCOUNTER — Telehealth: Payer: Self-pay | Admitting: Oncology

## 2017-11-19 ENCOUNTER — Other Ambulatory Visit (HOSPITAL_BASED_OUTPATIENT_CLINIC_OR_DEPARTMENT_OTHER): Payer: Medicare Other

## 2017-11-19 ENCOUNTER — Ambulatory Visit (HOSPITAL_BASED_OUTPATIENT_CLINIC_OR_DEPARTMENT_OTHER): Payer: Medicare Other | Admitting: Oncology

## 2017-11-19 VITALS — BP 137/58 | HR 63 | Temp 97.8°F | Resp 18 | Ht 72.0 in | Wt 185.9 lb

## 2017-11-19 DIAGNOSIS — D649 Anemia, unspecified: Secondary | ICD-10-CM

## 2017-11-19 DIAGNOSIS — D599 Acquired hemolytic anemia, unspecified: Secondary | ICD-10-CM

## 2017-11-19 DIAGNOSIS — C9110 Chronic lymphocytic leukemia of B-cell type not having achieved remission: Secondary | ICD-10-CM

## 2017-11-19 LAB — CBC WITH DIFFERENTIAL/PLATELET
BASO%: 0.7 % (ref 0.0–2.0)
BASOS ABS: 0 10*3/uL (ref 0.0–0.1)
EOS ABS: 0.1 10*3/uL (ref 0.0–0.5)
EOS%: 2.9 % (ref 0.0–7.0)
HEMATOCRIT: 39.5 % (ref 38.4–49.9)
HEMOGLOBIN: 12.8 g/dL — AB (ref 13.0–17.1)
LYMPH%: 58.1 % — ABNORMAL HIGH (ref 14.0–49.0)
MCH: 31.4 pg (ref 27.2–33.4)
MCHC: 32.4 g/dL (ref 32.0–36.0)
MCV: 96.8 fL (ref 79.3–98.0)
MONO#: 0.7 10*3/uL (ref 0.1–0.9)
MONO%: 14.6 % — AB (ref 0.0–14.0)
NEUT#: 1.1 10*3/uL — ABNORMAL LOW (ref 1.5–6.5)
NEUT%: 23.7 % — ABNORMAL LOW (ref 39.0–75.0)
PLATELETS: 141 10*3/uL (ref 140–400)
RBC: 4.08 10*6/uL — ABNORMAL LOW (ref 4.20–5.82)
RDW: 15.6 % — AB (ref 11.0–14.6)
WBC: 4.4 10*3/uL (ref 4.0–10.3)
lymph#: 2.6 10*3/uL (ref 0.9–3.3)

## 2017-11-19 LAB — COMPREHENSIVE METABOLIC PANEL
ALT: 24 U/L (ref 0–55)
ANION GAP: 7 meq/L (ref 3–11)
AST: 17 U/L (ref 5–34)
Albumin: 3.8 g/dL (ref 3.5–5.0)
Alkaline Phosphatase: 59 U/L (ref 40–150)
BUN: 23.1 mg/dL (ref 7.0–26.0)
CHLORIDE: 106 meq/L (ref 98–109)
CO2: 27 meq/L (ref 22–29)
CREATININE: 1.2 mg/dL (ref 0.7–1.3)
Calcium: 8.9 mg/dL (ref 8.4–10.4)
EGFR: 57 mL/min/{1.73_m2} — ABNORMAL LOW (ref >60)
Glucose: 180 mg/dl — ABNORMAL HIGH (ref 70–140)
POTASSIUM: 4.8 meq/L (ref 3.5–5.1)
Sodium: 140 mEq/L (ref 136–145)
Total Bilirubin: 1.2 mg/dL (ref 0.20–1.20)
Total Protein: 6.6 g/dL (ref 6.4–8.3)

## 2017-11-19 LAB — TECHNOLOGIST REVIEW

## 2017-11-19 NOTE — Progress Notes (Signed)
Hematology and Oncology Follow Up Visit  Richard Evans [id number] [birth year] 71 y.o. 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 He presented with hemoglobin of 7.5, MCV 106 with elevated reticulocyte count and haptoglobin less than 10. Coombs testing was positive. CT scan of the chest abdomen and pelvis showed nonspecific hilar adenopathy. Bone marrow biopsy obtained on 11/14/2016 confirmed the diagnosis of CLL.   Prior Therapy:   Status post packed red cell transfusion for 1 unit on 07/04/2016. Prednisone 70 mg daily started on 06/30/2016. He achieved complete response and prednisone tapered completely off on 09/11/2016. He relapsed quickly with worsening anemia. Prednisone 90 mg daily started on 09/30/2016. He achieved complete response on 10/17/2016. He completed prednisone taper on 12/18/2016. Rituximab at 375 mg/m weekly started on 08/08/2017. He is status post 4 weeks of therapy completed on 08/27/2017.  Current therapy: Prednisone 60 mg daily started on 09/17/2017.  He is currently on tapering doses of and taking 10 mg daily.   Interim History:  Richard Evans presents today for a follow-up visit. Since the last visit, he reports feeling well.  He was diagnosed with bronchitis and possible pneumonia and completed a course of antibiotics and fully recovered at this time.  He does not report any cough, fevers or shortness of breath.  His performance status and activity level has returned to normal levels.  He continues on prednisone taper and currently at 10 mg daily.  He denied any hypoglycemia episode or discoloration of his urine.  Denies any dyspnea on exertion or excessive fatigue.  He denies any abdominal pain or early satiety.  He denies any palpable adenopathy.  He does not report any headaches, blurry vision, syncope or seizures. He is not report any fevers or chills  or weight loss. He does not report any chest pain, palpitation, orthopnea or leg edema. He does not report any cough, wheezing or hemoptysis. He does not report any nausea, vomiting or abdominal pain. He does not report any frequency urgency or hesitancy. He has not reported any skeletal complaints. Remaining review of systems unremarkable   Medications: I have reviewed the patient's current medications.  Current Outpatient Medications  Medication Sig Dispense Refill  . atenolol (TENORMIN) 25 MG tablet Take 25 mg by mouth at bedtime.     . Cholecalciferol (VITAMIN D) 2000 units CAPS Take 1 capsule by mouth daily.    . diphenhydrAMINE (BENADRYL) 25 mg capsule Take 25 mg by mouth every 6 (six) hours as needed.    . folic acid (FOLVITE) 1 MG tablet TAKE ONE TABLET BY MOUTH EVERY DAY 30 tablet 2  . pantoprazole (PROTONIX) 40 MG tablet Take 40 mg by mouth daily.  1  . polyethylene glycol powder (CLEARLAX) powder Take 17 g by mouth daily as needed (constipation).    . Tamsulosin HCl (FLOMAX) 0.4 MG CAPS Take 0.4 mg by mouth at bedtime.      No current facility-administered medications for this visit.      Allergies:  Allergies  Allergen Reactions  . Adhesive [Tape] Itching and Rash    Paper tape or wrapped bandage is preferred    Past Medical History, Surgical history, Social history, and Family History were reviewed and updated.  Physical Exam: Blood pressure (!) 137/58, pulse 63, temperature 97.8 F (36.6 C), temperature source Oral, resp. rate 18, height 6' (1.829 m), weight 185 lb 14.4 oz (84.3  kg), SpO2 100 %. ECOG: 0 General appearance: Alert, awake gentleman without distress. Head: Normocephalic, without obvious abnormality. No oral ulcers or lesions. Sclerae anicteric. Neck: no adenopathy.  No thyroid masses. Lymph nodes: Cervical, supraclavicular, and axillary nodes normal. Heart:regular rate and rhythm, S1, S2 normal, no murmur, click, rub or gallop Lung:chest clear, no  wheezing, rales, normal symmetric air entry.  Abdomin: Soft, nontender without rebound or guarding.  No no rebound or guarding. EXT: No edema or erythema.   Lab Results: Lab Results  Component Value Date   WBC 4.4 11/19/2017   HGB 12.8 (L) 11/19/2017   HCT 39.5 11/19/2017   MCV 96.8 11/19/2017   PLT 141 11/19/2017     Chemistry      Component Value Date/Time   NA 140 11/19/2017 1138   K 4.8 11/19/2017 1138   CL 106 11/14/2016 0723   CO2 27 11/19/2017 1138   BUN 23.1 11/19/2017 1138   CREATININE 1.2 11/19/2017 1138      Component Value Date/Time   CALCIUM 8.9 11/19/2017 1138   ALKPHOS 59 11/19/2017 1138   AST 17 11/19/2017 1138   ALT 24 11/19/2017 1138   BILITOT 1.20 11/19/2017 1138      GEXAM: CT CHEST, ABDOMEN, AND PELVIS WITH CONTRAST  TECHNIQUE: Multidetector CT imaging of the chest, abdomen and pelvis was performed following the standard protocol during bolus administration of intravenous contrast.  CONTRAST:  100 cc Isovue-300  COMPARISON:  CT chest 12/26/2016. Abdomen and pelvis CT 09/25/2016 the  FINDINGS: CT CHEST FINDINGS  Cardiovascular: Heart size upper normal. Coronary artery calcification is evident. Atherosclerotic calcification is noted in the wall of the thoracic aorta.  Mediastinum/Nodes: Scattered mediastinal lymph nodes are similar to slightly progressed since 12/26/2016. 10 mm subcarinal lymph node on the prior study is now 11 mm. 14 mm right hilar lymph node measured previously is stable at 14 mm. 14 mm left hilar lymph node was measured at 11 mm on the prior study. The esophagus has normal imaging features. There is no axillary lymphadenopathy.  Lungs/Pleura: Centrilobular and paraseptal emphysema noted. Bandlike opacity in the right apex is similar and likely reflects scarring. 10 mm nodule posterior right costophrenic sulcus (image 167 series 6) is new in the interval no focal airspace consolidation. No pulmonary edema or  pleural effusion.  Musculoskeletal: Bone windows reveal no worrisome lytic or sclerotic osseous lesions.  CT ABDOMEN PELVIS FINDINGS  Hepatobiliary: No focal abnormality within the liver parenchyma. Gallbladder is decompressed with 13 mm noncalcified stone in the fundus. No intrahepatic or extrahepatic biliary dilation.  Pancreas: No focal mass lesion. No dilatation of the main duct. No intraparenchymal cyst. No peripancreatic edema.  Spleen: Tiny hypoattenuating lesion inferior spleen is nonspecific. Mild splenomegaly at 15.6 cm craniocaudal length.  Adrenals/Urinary Tract: No adrenal nodule or mass. Similar appearance right renal cysts. Left kidney unremarkable. No evidence for hydroureter. Left bladder diverticulum again noted.  Stomach/Bowel: Stomach is nondistended. No gastric wall thickening. No evidence of outlet obstruction. Duodenum is normally positioned as is the ligament of Treitz. 3.0 x 1.8 cm ill-defined soft tissue lesion is identified adjacent to the distal duodenum and a discrete loop of adjacent jejunum. Relationship to the distal duodenum is similar to prior although the lesion is in a different location today given mesenteric mobility. No small bowel wall thickening. No small bowel dilatation. The terminal ileum is normal. The appendix is normal. Diverticular changes are noted in the left colon without evidence of diverticulitis.  Vascular/Lymphatic: There is  abdominal aortic atherosclerosis without aneurysm. There is no gastrohepatic or hepatoduodenal ligament lymphadenopathy. No intraperitoneal or retroperitoneal lymphadenopathy. No pelvic sidewall lymphadenopathy.  Reproductive: Prostate gland is enlarged.  Other: No intraperitoneal free fluid.  Musculoskeletal: Left groin hernia contains only fat. Bone windows reveal no worrisome lytic or sclerotic osseous lesions.  IMPRESSION: 1. Stable to minimal progression of mild mediastinal and  hilar lymphadenopathy. 2. Stable to slight decrease in size of the soft tissue lesion adjacent to the distal duodenum. 3. Stable mild splenomegaly. 4.  Aortic Atherosclerois (ICD10-170.0)  ROSS AND MICROSCOPIC INFORMATION  Impression and Plan:   71 year old gentleman with the following issues:  1. Autoimmune hemolytic anemia presented with hemoglobin of 7.5 and positive Coombs testing. He was diagnosed In 06/30/2016.  He achieved complete response after initial prednisone therapy but relapsed after prednisone taper.  He is S/P rituximab without any complications. Week 4 therapy was 08/27/2017 with very little response noted.  Prednisone 60 mg daily started on September 17, 2017.  He had an excellent response with complete normalization of his hemoglobin and currently remains on a slow taper.  He will continue on 10 mg daily for the next week and discontinue after that.  We discussed different options moving forward including a splenectomy versus immunosuppressive agents.  For the time being, elected to continue with observation and will monitor his counts moving forward.  2. CLL: Diagnosed in December 2014. It is likely the etiology of his autoimmune hemolytic anemia. He presented with very little to no adenopathy and mild splenomegaly.  CT scan obtained on 11/18/2017 was reviewed today and showed no evidence of progression of disease at this time.  No indication to treat his CLL at this time other than hemolytic anemia.  I discussed the natural course of this disease today as well as future treatment options.  He continues to defer treatment at this time.  3. Hyperglycemia: Managed by his primary care physician.  His blood sugars appear manageable.  4. Splenomegaly: Related to CLL and not dramatically changed.  Spleen appeared unchanged.  5. Jaundice: Related to hemolysis and has resolved at this time.  6.  Bronchitis: Resolved at this time after finishing course of  antibiotics.  7. Follow-up: He will be for weekly follow-up for labs and an M.D. follow-up in 3 weeks.   Zola Button, MD 12/19/201812:26 PM

## 2017-11-19 NOTE — Telephone Encounter (Signed)
Gave avs and calendar for January and February 2019

## 2017-11-26 ENCOUNTER — Other Ambulatory Visit: Payer: Medicare Other

## 2017-12-01 DIAGNOSIS — Z299 Encounter for prophylactic measures, unspecified: Secondary | ICD-10-CM | POA: Diagnosis not present

## 2017-12-01 DIAGNOSIS — I10 Essential (primary) hypertension: Secondary | ICD-10-CM | POA: Diagnosis not present

## 2017-12-01 DIAGNOSIS — L739 Follicular disorder, unspecified: Secondary | ICD-10-CM | POA: Diagnosis not present

## 2017-12-01 DIAGNOSIS — R21 Rash and other nonspecific skin eruption: Secondary | ICD-10-CM | POA: Diagnosis not present

## 2017-12-01 DIAGNOSIS — C9190 Lymphoid leukemia, unspecified not having achieved remission: Secondary | ICD-10-CM | POA: Diagnosis not present

## 2017-12-01 DIAGNOSIS — Z6826 Body mass index (BMI) 26.0-26.9, adult: Secondary | ICD-10-CM | POA: Diagnosis not present

## 2017-12-03 ENCOUNTER — Other Ambulatory Visit (HOSPITAL_BASED_OUTPATIENT_CLINIC_OR_DEPARTMENT_OTHER): Payer: Medicare Other

## 2017-12-03 ENCOUNTER — Encounter: Payer: Self-pay | Admitting: *Deleted

## 2017-12-03 DIAGNOSIS — D599 Acquired hemolytic anemia, unspecified: Secondary | ICD-10-CM

## 2017-12-03 DIAGNOSIS — C9110 Chronic lymphocytic leukemia of B-cell type not having achieved remission: Secondary | ICD-10-CM | POA: Diagnosis present

## 2017-12-03 DIAGNOSIS — D649 Anemia, unspecified: Secondary | ICD-10-CM

## 2017-12-03 LAB — CBC WITH DIFFERENTIAL/PLATELET
BASO%: 0.5 % (ref 0.0–2.0)
BASOS ABS: 0.1 10*3/uL (ref 0.0–0.1)
EOS%: 0.6 % (ref 0.0–7.0)
Eosinophils Absolute: 0.1 10*3/uL (ref 0.0–0.5)
HEMATOCRIT: 40.6 % (ref 38.4–49.9)
HGB: 13.7 g/dL (ref 13.0–17.1)
LYMPH#: 3.9 10*3/uL — AB (ref 0.9–3.3)
LYMPH%: 33 % (ref 14.0–49.0)
MCH: 30.6 pg (ref 27.2–33.4)
MCHC: 33.7 g/dL (ref 32.0–36.0)
MCV: 91 fL (ref 79.3–98.0)
MONO#: 0.8 10*3/uL (ref 0.1–0.9)
MONO%: 7.1 % (ref 0.0–14.0)
NEUT#: 7 10*3/uL — ABNORMAL HIGH (ref 1.5–6.5)
NEUT%: 58.8 % (ref 39.0–75.0)
PLATELETS: 117 10*3/uL — AB (ref 140–400)
RBC: 4.47 10*6/uL (ref 4.20–5.82)
RDW: 15.4 % — ABNORMAL HIGH (ref 11.0–14.6)
WBC: 11.8 10*3/uL — ABNORMAL HIGH (ref 4.0–10.3)

## 2017-12-03 LAB — COMPREHENSIVE METABOLIC PANEL
ALT: 22 U/L (ref 0–55)
ANION GAP: 10 meq/L (ref 3–11)
AST: 16 U/L (ref 5–34)
Albumin: 4.2 g/dL (ref 3.5–5.0)
Alkaline Phosphatase: 66 U/L (ref 40–150)
BUN: 22.1 mg/dL (ref 7.0–26.0)
CHLORIDE: 109 meq/L (ref 98–109)
CO2: 22 mEq/L (ref 22–29)
Calcium: 9.3 mg/dL (ref 8.4–10.4)
Creatinine: 1.1 mg/dL (ref 0.7–1.3)
EGFR: >60 mL/min/{1.73_m2} (ref >60)
Glucose: 182 mg/dl — ABNORMAL HIGH (ref 70–140)
POTASSIUM: 4.1 meq/L (ref 3.5–5.1)
Sodium: 141 mEq/L (ref 136–145)
Total Bilirubin: 1.01 mg/dL (ref 0.20–1.20)
Total Protein: 7.4 g/dL (ref 6.4–8.3)

## 2017-12-10 ENCOUNTER — Inpatient Hospital Stay: Payer: Medicare Other | Attending: Oncology

## 2017-12-10 DIAGNOSIS — C9110 Chronic lymphocytic leukemia of B-cell type not having achieved remission: Secondary | ICD-10-CM | POA: Diagnosis not present

## 2017-12-10 DIAGNOSIS — Z8579 Personal history of other malignant neoplasms of lymphoid, hematopoietic and related tissues: Secondary | ICD-10-CM | POA: Insufficient documentation

## 2017-12-10 DIAGNOSIS — D591 Other autoimmune hemolytic anemias: Secondary | ICD-10-CM | POA: Diagnosis not present

## 2017-12-10 DIAGNOSIS — D649 Anemia, unspecified: Secondary | ICD-10-CM

## 2017-12-10 DIAGNOSIS — D599 Acquired hemolytic anemia, unspecified: Secondary | ICD-10-CM

## 2017-12-10 LAB — CBC WITH DIFFERENTIAL/PLATELET
Abs Granulocyte: 4.1 10*3/uL (ref 1.5–6.5)
Basophils Absolute: 0 10*3/uL (ref 0.0–0.1)
Basophils Relative: 0 %
EOS ABS: 0.3 10*3/uL (ref 0.0–0.5)
EOS PCT: 3 %
HCT: 43.2 % (ref 38.4–49.9)
HEMOGLOBIN: 14.7 g/dL (ref 13.0–17.1)
Lymphocytes Relative: 51 %
Lymphs Abs: 5.3 10*3/uL — ABNORMAL HIGH (ref 0.9–3.3)
MCH: 32.2 pg (ref 27.2–33.4)
MCHC: 34 g/dL (ref 32.0–36.0)
MCV: 94.5 fL (ref 79.3–98.0)
MONOS PCT: 6 %
Monocytes Absolute: 0.7 10*3/uL (ref 0.1–0.9)
NEUTROS ABS: 4.1 10*3/uL (ref 1.5–6.5)
Neutrophils Relative %: 40 %
PLATELETS: 127 10*3/uL — AB (ref 140–400)
RBC: 4.57 MIL/uL (ref 4.20–5.82)
RDW: 15.5 % (ref 11.0–15.6)
WBC: 10.4 10*3/uL — AB (ref 4.0–10.3)

## 2017-12-10 LAB — COMPREHENSIVE METABOLIC PANEL
ALK PHOS: 59 U/L (ref 40–150)
ALT: 23 U/L (ref 0–55)
ANION GAP: 8 (ref 3–11)
AST: 16 U/L (ref 5–34)
Albumin: 4.2 g/dL (ref 3.5–5.0)
BILIRUBIN TOTAL: 1.1 mg/dL (ref 0.2–1.2)
BUN: 22 mg/dL (ref 7–26)
CALCIUM: 9.1 mg/dL (ref 8.4–10.4)
CO2: 24 mmol/L (ref 22–29)
Chloride: 107 mmol/L (ref 98–109)
Creatinine, Ser: 1.09 mg/dL (ref 0.70–1.30)
GFR calc Af Amer: >60 mL/min (ref >60)
Glucose, Bld: 154 mg/dL — ABNORMAL HIGH (ref 70–140)
POTASSIUM: 4.2 mmol/L (ref 3.5–5.1)
Sodium: 139 mmol/L (ref 136–145)
TOTAL PROTEIN: 7.1 g/dL (ref 6.4–8.3)

## 2017-12-10 LAB — SAMPLE TO BLOOD BANK

## 2017-12-17 ENCOUNTER — Inpatient Hospital Stay (HOSPITAL_BASED_OUTPATIENT_CLINIC_OR_DEPARTMENT_OTHER): Payer: Medicare Other | Admitting: Oncology

## 2017-12-17 ENCOUNTER — Telehealth: Payer: Self-pay | Admitting: Oncology

## 2017-12-17 ENCOUNTER — Inpatient Hospital Stay: Payer: Medicare Other

## 2017-12-17 VITALS — BP 135/56 | HR 54 | Temp 97.7°F | Resp 20 | Ht 72.0 in | Wt 190.1 lb

## 2017-12-17 DIAGNOSIS — D591 Other autoimmune hemolytic anemias: Secondary | ICD-10-CM | POA: Diagnosis not present

## 2017-12-17 DIAGNOSIS — D599 Acquired hemolytic anemia, unspecified: Secondary | ICD-10-CM

## 2017-12-17 DIAGNOSIS — C9110 Chronic lymphocytic leukemia of B-cell type not having achieved remission: Secondary | ICD-10-CM | POA: Diagnosis not present

## 2017-12-17 DIAGNOSIS — Z8579 Personal history of other malignant neoplasms of lymphoid, hematopoietic and related tissues: Secondary | ICD-10-CM | POA: Diagnosis not present

## 2017-12-17 DIAGNOSIS — D649 Anemia, unspecified: Secondary | ICD-10-CM

## 2017-12-17 LAB — CBC WITH DIFFERENTIAL/PLATELET
BASOS PCT: 0 %
Basophils Absolute: 0 10*3/uL (ref 0.0–0.1)
EOS ABS: 0.3 10*3/uL (ref 0.0–0.5)
EOS PCT: 3 %
HCT: 42.5 % (ref 38.4–49.9)
Hemoglobin: 14.6 g/dL (ref 13.0–17.1)
LYMPHS ABS: 5.5 10*3/uL — AB (ref 0.9–3.3)
Lymphocytes Relative: 50 %
MCH: 31.7 pg (ref 27.2–33.4)
MCHC: 34.4 g/dL (ref 32.0–36.0)
MCV: 92.4 fL (ref 79.3–98.0)
MONOS PCT: 7 %
Monocytes Absolute: 0.8 10*3/uL (ref 0.1–0.9)
NEUTROS PCT: 40 %
Neutro Abs: 4.5 10*3/uL (ref 1.5–6.5)
PLATELETS: 117 10*3/uL — AB (ref 140–400)
RBC: 4.6 MIL/uL (ref 4.20–5.82)
RDW: 15.1 % (ref 11.0–15.6)
WBC: 11.1 10*3/uL — ABNORMAL HIGH (ref 4.0–10.3)

## 2017-12-17 LAB — COMPREHENSIVE METABOLIC PANEL
ALBUMIN: 4.3 g/dL (ref 3.5–5.0)
ALT: 27 U/L (ref 0–55)
ANION GAP: 8 (ref 3–11)
AST: 19 U/L (ref 5–34)
Alkaline Phosphatase: 64 U/L (ref 40–150)
BUN: 29 mg/dL — ABNORMAL HIGH (ref 7–26)
CHLORIDE: 109 mmol/L (ref 98–109)
CO2: 25 mmol/L (ref 22–29)
Calcium: 9.3 mg/dL (ref 8.4–10.4)
Creatinine, Ser: 1.15 mg/dL (ref 0.70–1.30)
GFR calc non Af Amer: >60 mL/min (ref >60)
GLUCOSE: 126 mg/dL (ref 70–140)
POTASSIUM: 4.2 mmol/L (ref 3.5–5.1)
SODIUM: 142 mmol/L (ref 136–145)
Total Bilirubin: 1 mg/dL (ref 0.2–1.2)
Total Protein: 7.1 g/dL (ref 6.4–8.3)

## 2017-12-17 LAB — SAMPLE TO BLOOD BANK

## 2017-12-17 NOTE — Telephone Encounter (Signed)
Scheduled appt per 1/16 los - Gave patient AVS and calender per los.

## 2017-12-17 NOTE — Progress Notes (Signed)
Hematology and Oncology Follow Up Visit  Richard Evans [id number] 05-01-1946 72 y.o. 12/17/2017 9:25 AM [name], [name], MDVyas, Dhruv B, MD   Principle Diagnosis: 72 year old man with:  1. Autoimmune hemolytic anemia diagnosed on July 30 of 2017. He presented with hemoglobin of 7.5, MCV 106 with elevated reticulocyte count and haptoglobin less than 10. Coombs testing was positive.   2. CLL. Bone marrow biopsy obtained on 11/14/2016 confirmed the diagnosis of CLL. CT scan of the chest abdomen and pelvis showed hilar adenopathy and splenomegaly.  Prior Therapy:    Prednisone 70 mg daily started on 06/30/2016. He achieved complete response and prednisone tapered completely off on 09/11/2016. He relapsed quickly with worsening anemia.  Prednisone 90 mg daily started on 09/30/2016. He achieved complete response on 10/17/2016. He completed prednisone taper on 12/18/2016.  Rituximab at 375 mg/m weekly started on 08/08/2017. He is status post 4 weeks of therapy completed on 08/27/2017.  Prednisone 60 mg daily started on 09/17/2017.  He was tapered off in December 2018.  Current therapy: Observation and surveillance.   Interim History:  Richard Evans is here with his wife for a follow-up.  He reports no major changes since the last visit.  He developed contact dermatitis and was prescribed prednisone by his primary care physician and has resolved at this time.  He completed a prednisone taper in December 2018 without any complications.  He feels well overall without any recurrent symptoms of fatigue, dyspnea on exertion or discoloration of his urine.  He denies any lymphadenopathy or appetite changes.  He continues to work outside without any changes in his performance status.  He does not report any headaches, blurry vision, syncope or seizures. He is not report any fevers or chills or weight loss. He does not report any chest pain, palpitation, orthopnea or leg edema. He does not report any cough,  wheezing or hemoptysis. He does not report any nausea, vomiting or abdominal pain. He does not report any frequency urgency or hesitancy. He has not reported any skeletal complaints.  He does not report any lymphadenopathy or petechiae.  Remaining review of systems is negative.    Medications: I have reviewed the patient's current medications.  Current Outpatient Medications  Medication Sig Dispense Refill  . atenolol (TENORMIN) 25 MG tablet Take 25 mg by mouth at bedtime.     . Cholecalciferol (VITAMIN D) 2000 units CAPS Take 1 capsule by mouth daily.    . diphenhydrAMINE (BENADRYL) 25 mg capsule Take 25 mg by mouth every 6 (six) hours as needed.    . folic acid (FOLVITE) 1 MG tablet TAKE ONE TABLET BY MOUTH EVERY DAY 30 tablet 2  . pantoprazole (PROTONIX) 40 MG tablet Take 40 mg by mouth daily.  1  . polyethylene glycol powder (CLEARLAX) powder Take 17 g by mouth daily as needed (constipation).    . Tamsulosin HCl (FLOMAX) 0.4 MG CAPS Take 0.4 mg by mouth at bedtime.      No current facility-administered medications for this visit.      Allergies:  Allergies  Allergen Reactions  . Adhesive [Tape] Itching and Rash    Paper tape or wrapped bandage is preferred    Past Medical History, Surgical history, Social history, and Family History discussed today and unchanged from previous examination.  Physical Exam: Blood pressure (!) 135/56, pulse (!) 54, temperature 97.7 F (36.5 C), temperature source Oral, resp. rate 20, height 6' (1.829 m), weight 190 lb 1.6 oz (86.2 kg), SpO2 100 %.  ECOG: 0 General appearance: Well-appearing gentleman without distress. Head: Normocephalic, without obvious abnormality.  Oropharynx no oral ulcers or lesions. Eyes: Sclerae anicteric.  Toes are equal and round reactive to light. Lymph nodes: Cervical, supraclavicular, and axillary nodes normal. Heart:regular rate and rhythm, S1, S2 normal, no murmur, click, rub or gallop Lung:chest clear, no wheezing,  rales.  No dullness to percussion. Abdomin: Soft, nontender without rebound or guarding.  Good bowel sounds auscultated in all quadrants. Musculoskeletal: No joint effusion or tenderness. Skin: Faint raised erythema noted on his left cheek.  Lab Results: Lab Results  Component Value Date   WBC 11.1 (H) 12/17/2017   HGB 14.6 12/17/2017   HCT 42.5 12/17/2017   MCV 92.4 12/17/2017   PLT 117 (L) 12/17/2017     Chemistry      Component Value Date/Time   NA 139 12/10/2017 0731   NA 141 12/03/2017 0741   K 4.2 12/10/2017 0731   K 4.1 12/03/2017 0741   CL 107 12/10/2017 0731   CO2 24 12/10/2017 0731   CO2 22 12/03/2017 0741   BUN 22 12/10/2017 0731   BUN 22.1 12/03/2017 0741   CREATININE 1.09 12/10/2017 0731   CREATININE 1.1 12/03/2017 0741      Component Value Date/Time   CALCIUM 9.1 12/10/2017 0731   CALCIUM 9.3 12/03/2017 0741   ALKPHOS 59 12/10/2017 0731   ALKPHOS 66 12/03/2017 0741   AST 16 12/10/2017 0731   AST 16 12/03/2017 0741   ALT 23 12/10/2017 0731   ALT 22 12/03/2017 0741   BILITOT 1.1 12/10/2017 0731   BILITOT 1.01 12/03/2017 0741      G IMPRESSION: 1. Stable to minimal progression of mild mediastinal and hilar lymphadenopathy. 2. Stable to slight decrease in size of the soft tissue lesion adjacent to the distal duodenum. 3. Stable mild splenomegaly. 4.  Aortic Atherosclerois (ICD10-170.0)  ROSS AND MICROSCOPIC INFORMATION  Impression and Plan:   72 year old gentleman with the following issues:  1. Autoimmune hemolytic anemia presented with hemoglobin of 7.5 and positive Coombs testing in 06/30/2016.  This is related to a lymphoproliferative disorder.  He Is status post prednisone treatment on 3 occasions and achieved remission after each treatment.  Last of which was in December 2000.  He is S/P rituximab without any complications. Week 4 therapy was 08/27/2017 with very little response noted.  He was evaluated for possible splenectomy but  have deferred that option at this time.  Treatment options were reviewed again today which include continued observation and surveillance, using immunosuppressive agents such as Cytoxan or Imuran versus splenectomy.  After discussion today he opted to continue with observation and surveillance for the time being.  2. CLL: Diagnosed in December 2014. CT scan obtained on 11/18/2017 was personally reviewed again and discussed with the patient. Showed no evidence of progression of disease at this time.    Indication for treatment for this condition was reviewed with the patient today.  These would include worsening cytopenias, recurrent infection or painful adenopathy.  At this time there is no indication for treatment and we will continue with observation and surveillance.  3. Hyperglycemia: His blood sugars appear manageable.  Followed by his primary care physician  4. Follow-up: He will be very 2 weeks for laboratory testing and MD follow-up in 8 weeks.  15 minutes was spent with the patient today face-to-face.  More than 50% of the time was dedicated to patient counseling, education about his disease and coordination of care.   Roxy Cedar  Alen Blew, MD 1/16/20199:25 AM

## 2017-12-19 DIAGNOSIS — Z299 Encounter for prophylactic measures, unspecified: Secondary | ICD-10-CM | POA: Diagnosis not present

## 2017-12-19 DIAGNOSIS — Z1331 Encounter for screening for depression: Secondary | ICD-10-CM | POA: Diagnosis not present

## 2017-12-19 DIAGNOSIS — Z7189 Other specified counseling: Secondary | ICD-10-CM | POA: Diagnosis not present

## 2017-12-19 DIAGNOSIS — Z1211 Encounter for screening for malignant neoplasm of colon: Secondary | ICD-10-CM | POA: Diagnosis not present

## 2017-12-19 DIAGNOSIS — E785 Hyperlipidemia, unspecified: Secondary | ICD-10-CM | POA: Diagnosis not present

## 2017-12-19 DIAGNOSIS — R5383 Other fatigue: Secondary | ICD-10-CM | POA: Diagnosis not present

## 2017-12-19 DIAGNOSIS — Z6826 Body mass index (BMI) 26.0-26.9, adult: Secondary | ICD-10-CM | POA: Diagnosis not present

## 2017-12-19 DIAGNOSIS — Z0000 Encounter for general adult medical examination without abnormal findings: Secondary | ICD-10-CM | POA: Diagnosis not present

## 2017-12-19 DIAGNOSIS — Z125 Encounter for screening for malignant neoplasm of prostate: Secondary | ICD-10-CM | POA: Diagnosis not present

## 2017-12-19 DIAGNOSIS — Z1339 Encounter for screening examination for other mental health and behavioral disorders: Secondary | ICD-10-CM | POA: Diagnosis not present

## 2017-12-19 DIAGNOSIS — Z79899 Other long term (current) drug therapy: Secondary | ICD-10-CM | POA: Diagnosis not present

## 2017-12-19 DIAGNOSIS — C9190 Lymphoid leukemia, unspecified not having achieved remission: Secondary | ICD-10-CM | POA: Diagnosis not present

## 2017-12-19 DIAGNOSIS — K5730 Diverticulosis of large intestine without perforation or abscess without bleeding: Secondary | ICD-10-CM | POA: Diagnosis not present

## 2017-12-19 DIAGNOSIS — I10 Essential (primary) hypertension: Secondary | ICD-10-CM | POA: Diagnosis not present

## 2017-12-19 DIAGNOSIS — Z87891 Personal history of nicotine dependence: Secondary | ICD-10-CM | POA: Diagnosis not present

## 2017-12-24 ENCOUNTER — Other Ambulatory Visit: Payer: Medicare Other

## 2017-12-31 ENCOUNTER — Encounter: Payer: Self-pay | Admitting: *Deleted

## 2017-12-31 ENCOUNTER — Telehealth: Payer: Self-pay | Admitting: *Deleted

## 2017-12-31 ENCOUNTER — Inpatient Hospital Stay: Payer: Medicare Other

## 2017-12-31 DIAGNOSIS — C9110 Chronic lymphocytic leukemia of B-cell type not having achieved remission: Secondary | ICD-10-CM | POA: Diagnosis not present

## 2017-12-31 DIAGNOSIS — Z8579 Personal history of other malignant neoplasms of lymphoid, hematopoietic and related tissues: Secondary | ICD-10-CM | POA: Diagnosis not present

## 2017-12-31 DIAGNOSIS — D599 Acquired hemolytic anemia, unspecified: Secondary | ICD-10-CM

## 2017-12-31 DIAGNOSIS — D591 Other autoimmune hemolytic anemias: Secondary | ICD-10-CM | POA: Diagnosis not present

## 2017-12-31 LAB — SAMPLE TO BLOOD BANK

## 2017-12-31 LAB — CBC WITH DIFFERENTIAL (CANCER CENTER ONLY)
BASOS PCT: 1 %
Basophils Absolute: 0.1 10*3/uL (ref 0.0–0.1)
Eosinophils Absolute: 0.4 10*3/uL (ref 0.0–0.5)
Eosinophils Relative: 4 %
HEMATOCRIT: 41.6 % (ref 38.4–49.9)
HEMOGLOBIN: 14.5 g/dL (ref 13.0–17.1)
Lymphocytes Relative: 55 %
Lymphs Abs: 5.8 10*3/uL — ABNORMAL HIGH (ref 0.9–3.3)
MCH: 31.6 pg (ref 27.2–33.4)
MCHC: 34.9 g/dL (ref 32.0–36.0)
MCV: 90.6 fL (ref 79.3–98.0)
MONO ABS: 0.9 10*3/uL (ref 0.1–0.9)
Monocytes Relative: 9 %
NEUTROS ABS: 3.4 10*3/uL (ref 1.5–6.5)
NEUTROS PCT: 33 %
Platelet Count: 108 10*3/uL — ABNORMAL LOW (ref 140–400)
RBC: 4.59 MIL/uL (ref 4.20–5.82)
RDW: 15.7 % — AB (ref 11.0–15.6)
WBC: 10.5 10*3/uL — AB (ref 4.0–10.3)

## 2017-12-31 NOTE — Telephone Encounter (Signed)
Spoke with patient, per dr Alen Blew, labs good, no change for now.

## 2017-12-31 NOTE — Telephone Encounter (Signed)
-----   Message from Ashton Portela, MD sent at 12/31/2017  8:06 AM EST ----- Please let him know his hgb is normal. No change for now

## 2018-01-07 ENCOUNTER — Other Ambulatory Visit: Payer: Medicare Other

## 2018-01-14 ENCOUNTER — Inpatient Hospital Stay: Payer: Medicare Other | Attending: Oncology

## 2018-01-14 ENCOUNTER — Encounter: Payer: Self-pay | Admitting: *Deleted

## 2018-01-14 DIAGNOSIS — D599 Acquired hemolytic anemia, unspecified: Secondary | ICD-10-CM

## 2018-01-14 DIAGNOSIS — Z8579 Personal history of other malignant neoplasms of lymphoid, hematopoietic and related tissues: Secondary | ICD-10-CM | POA: Diagnosis present

## 2018-01-14 DIAGNOSIS — D591 Other autoimmune hemolytic anemias: Secondary | ICD-10-CM | POA: Insufficient documentation

## 2018-01-14 DIAGNOSIS — C9110 Chronic lymphocytic leukemia of B-cell type not having achieved remission: Secondary | ICD-10-CM | POA: Diagnosis present

## 2018-01-14 LAB — CBC WITH DIFFERENTIAL (CANCER CENTER ONLY)
Basophils Absolute: 0 10*3/uL (ref 0.0–0.1)
Basophils Relative: 0 %
EOS PCT: 2 %
Eosinophils Absolute: 0.2 10*3/uL (ref 0.0–0.5)
HEMATOCRIT: 42.2 % (ref 38.4–49.9)
Hemoglobin: 14.6 g/dL (ref 13.0–17.1)
LYMPHS PCT: 52 %
Lymphs Abs: 5.4 10*3/uL — ABNORMAL HIGH (ref 0.9–3.3)
MCH: 31.3 pg (ref 27.2–33.4)
MCHC: 34.5 g/dL (ref 32.0–36.0)
MCV: 90.9 fL (ref 79.3–98.0)
MONO ABS: 0.9 10*3/uL (ref 0.1–0.9)
MONOS PCT: 9 %
NEUTROS ABS: 3.8 10*3/uL (ref 1.5–6.5)
Neutrophils Relative %: 37 %
Platelet Count: 131 10*3/uL — ABNORMAL LOW (ref 140–400)
RBC: 4.65 MIL/uL (ref 4.20–5.82)
RDW: 16.2 % — AB (ref 11.0–14.6)
WBC: 10.4 10*3/uL — AB (ref 4.0–10.3)

## 2018-01-14 LAB — SAMPLE TO BLOOD BANK

## 2018-01-28 ENCOUNTER — Inpatient Hospital Stay: Payer: Medicare Other

## 2018-01-28 ENCOUNTER — Encounter: Payer: Self-pay | Admitting: *Deleted

## 2018-01-28 DIAGNOSIS — D591 Other autoimmune hemolytic anemias: Secondary | ICD-10-CM | POA: Diagnosis not present

## 2018-01-28 DIAGNOSIS — D599 Acquired hemolytic anemia, unspecified: Secondary | ICD-10-CM

## 2018-01-28 LAB — CBC WITH DIFFERENTIAL (CANCER CENTER ONLY)
Basophils Absolute: 0.1 10*3/uL (ref 0.0–0.1)
Basophils Relative: 1 %
EOS ABS: 0.3 10*3/uL (ref 0.0–0.5)
Eosinophils Relative: 4 %
HCT: 38.9 % (ref 38.4–49.9)
Hemoglobin: 13.6 g/dL (ref 13.0–17.1)
LYMPHS ABS: 4.8 10*3/uL — AB (ref 0.9–3.3)
Lymphocytes Relative: 73 %
MCH: 32.8 pg (ref 27.2–33.4)
MCHC: 35 g/dL (ref 32.0–36.0)
MCV: 93.7 fL (ref 79.3–98.0)
MONO ABS: 1.2 10*3/uL — AB (ref 0.1–0.9)
MONOS PCT: 18 %
Neutro Abs: 0.3 10*3/uL — CL (ref 1.5–6.5)
Neutrophils Relative %: 4 %
PLATELETS: 116 10*3/uL — AB (ref 140–400)
RBC: 4.15 MIL/uL — ABNORMAL LOW (ref 4.20–5.82)
RDW: 14.9 % — AB (ref 11.0–14.6)
WBC Count: 6.6 10*3/uL (ref 4.0–10.3)

## 2018-01-28 LAB — SAMPLE TO BLOOD BANK

## 2018-01-30 DIAGNOSIS — Z713 Dietary counseling and surveillance: Secondary | ICD-10-CM | POA: Diagnosis not present

## 2018-01-30 DIAGNOSIS — Z6827 Body mass index (BMI) 27.0-27.9, adult: Secondary | ICD-10-CM | POA: Diagnosis not present

## 2018-01-30 DIAGNOSIS — J111 Influenza due to unidentified influenza virus with other respiratory manifestations: Secondary | ICD-10-CM | POA: Diagnosis not present

## 2018-01-30 DIAGNOSIS — Z299 Encounter for prophylactic measures, unspecified: Secondary | ICD-10-CM | POA: Diagnosis not present

## 2018-02-11 ENCOUNTER — Inpatient Hospital Stay: Payer: Medicare Other

## 2018-02-11 ENCOUNTER — Telehealth: Payer: Self-pay

## 2018-02-11 ENCOUNTER — Inpatient Hospital Stay: Payer: Medicare Other | Attending: Oncology | Admitting: Oncology

## 2018-02-11 VITALS — BP 128/64 | HR 58 | Temp 99.2°F | Resp 20 | Ht 72.0 in | Wt 191.7 lb

## 2018-02-11 DIAGNOSIS — D599 Acquired hemolytic anemia, unspecified: Secondary | ICD-10-CM

## 2018-02-11 DIAGNOSIS — Z79899 Other long term (current) drug therapy: Secondary | ICD-10-CM | POA: Insufficient documentation

## 2018-02-11 DIAGNOSIS — D591 Other autoimmune hemolytic anemias: Secondary | ICD-10-CM | POA: Diagnosis not present

## 2018-02-11 DIAGNOSIS — C9110 Chronic lymphocytic leukemia of B-cell type not having achieved remission: Secondary | ICD-10-CM | POA: Diagnosis not present

## 2018-02-11 DIAGNOSIS — R739 Hyperglycemia, unspecified: Secondary | ICD-10-CM | POA: Insufficient documentation

## 2018-02-11 DIAGNOSIS — D696 Thrombocytopenia, unspecified: Secondary | ICD-10-CM | POA: Diagnosis not present

## 2018-02-11 LAB — SAMPLE TO BLOOD BANK

## 2018-02-11 LAB — CBC WITH DIFFERENTIAL (CANCER CENTER ONLY)
BASOS PCT: 1 %
Basophils Absolute: 0.1 10*3/uL (ref 0.0–0.1)
EOS PCT: 2 %
Eosinophils Absolute: 0.2 10*3/uL (ref 0.0–0.5)
HCT: 39.6 % (ref 38.4–49.9)
Hemoglobin: 14.1 g/dL (ref 13.0–17.1)
LYMPHS PCT: 62 %
Lymphs Abs: 6 10*3/uL — ABNORMAL HIGH (ref 0.9–3.3)
MCH: 32.6 pg (ref 27.2–33.4)
MCHC: 35.6 g/dL (ref 32.0–36.0)
MCV: 91.7 fL (ref 79.3–98.0)
MONO ABS: 0.7 10*3/uL (ref 0.1–0.9)
MONOS PCT: 7 %
NEUTROS ABS: 2.7 10*3/uL (ref 1.5–6.5)
Neutrophils Relative %: 28 %
Platelet Count: 103 10*3/uL — ABNORMAL LOW (ref 140–400)
RBC: 4.32 MIL/uL (ref 4.20–5.82)
RDW: 15.1 % — AB (ref 11.0–14.6)
WBC Count: 9.6 10*3/uL (ref 4.0–10.3)

## 2018-02-11 NOTE — Telephone Encounter (Signed)
Printed avs and calender of upcoming appt. Per 3/13 los

## 2018-02-11 NOTE — Progress Notes (Signed)
Hematology and Oncology Follow Up Visit  Richard Evans [id number] [birth year] 72 y.o. 02/11/2018 7:55 AM [name], [name], MDVyas, Dhruv B, MD   Principle Diagnosis: 72 year old man with:  1. CLL: Diagnosed in 2017 after presenting with lymphadenopathy, splenomegaly and autoimmune hemolytic anemia. Bone marrow biopsy obtained on 11/14/2016 confirmed the diagnosis.   2. 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 Coombs testing was positive. [facility] July 2017.    Prior Therapy:    Prednisone 70 mg daily started on 06/30/2016. He achieved complete response and prednisone tapered completely off on 09/11/2016. He relapsed in October 2017.  Prednisone 90 mg daily started on 09/30/2016. He achieved complete response on 10/17/2016. He completed prednisone taper on 12/18/2016.  Rituximab at 375 mg/m weekly started on 08/08/2017. He is status post 4 weeks of therapy completed on 08/27/2017.  Prednisone 60 mg daily started on 09/17/2017.  He was tapered off in December 2018 after achieving a complete response.  Current therapy: Active surveillance.   Interim History:  Richard Evans is here for a follow-up. He reports no major changes since last visit. He developed an episode of low-grade temperature and arthralgias and was treated by his primary care physician was Tamiflu and a prednisone taper. His symptoms resolved spontaneously and did not require any hospitalizations or antibiotics. He remains active and continues to attend to her activities of daily living. In the last few days he does not report any fevers, chills or weight loss. His appetite is been excellent and has gained weight.  He denied any lymphadenopathy or easy bruisability. He denies any cough or hemoptysis. His performance status remain excellent.   He does not report any headaches, blurry vision, syncope or seizures. He is not  report any fevers or chills or weight loss. He does not report any chest pain, palpitation, orthopnea or leg edema. He does not report any cough, wheezing or hemoptysis. He does not report any nausea, vomiting or abdominal pain. He does not report any frequency urgency or hesitancy. He has not reported any skeletal complaints.  He does not report any lymphadenopathy or petechiae.  Remaining review of systems is negative.    Medications: I have reviewed the patient's current medications.  Current Outpatient Medications  Medication Sig Dispense Refill  . atenolol (TENORMIN) 25 MG tablet Take 25 mg by mouth at bedtime.     . Cholecalciferol (VITAMIN D) 2000 units CAPS Take 1 capsule by mouth daily.    . diphenhydrAMINE (BENADRYL) 25 mg capsule Take 25 mg by mouth every 6 (six) hours as needed.    . folic acid (FOLVITE) 1 MG tablet TAKE ONE TABLET BY MOUTH EVERY DAY 30 tablet 2  . pantoprazole (PROTONIX) 40 MG tablet Take 40 mg by mouth daily.  1  . polyethylene glycol powder (CLEARLAX) powder Take 17 g by mouth daily as needed (constipation).    . Tamsulosin HCl (FLOMAX) 0.4 MG CAPS Take 0.4 mg by mouth at bedtime.      No current facility-administered medications for this visit.      Allergies:  Allergies  Allergen Reactions  . Adhesive [Tape] Itching and Rash    Paper tape or wrapped bandage is preferred    Past Medical History, Surgical history, Social history, and Family History discussed today and unchanged from previous examination.  Physical Exam: Blood pressure 128/64, pulse (!) 58, temperature 99.2 F (37.3 C), temperature source  Oral, resp. rate 20, height 6' (1.829 m), weight 191 lb 11.2 oz (87 kg), SpO2 96 %.   ECOG: 0 General appearance: Alert, awake appeared without distress. Head: Without any abnormalities. Oropharynx: Mucous membranes are moist and pink without ulcerations or thrush. Eyes: Pupils are equal and round and reactive to light. No scleral icterus. Lymph  nodes: No adenopathy noted in the cervical, axillary or supraclavicular nodes. Heart: Regular rate and rhythm without murmurs rubs or gallops. Lung: Clear to auscultation without any rhonchi, wheezes or dullness to percussion. Abdomin: Soft, nontender without any rebound or guarding. No shifting dullness or ascites. Musculoskeletal: Full range of motion noted in all joints. Skin: No ecchymosis or rash. Neurological: No motor or sensory deficits.  Lab Results: Lab Results  Component Value Date   WBC 9.6 02/11/2018   HGB 14.6 12/17/2017   HCT 39.6 02/11/2018   MCV 91.7 02/11/2018   PLT 103 (L) 02/11/2018     Chemistry      Component Value Date/Time   NA 142 12/17/2017 0835   NA 141 12/03/2017 0741   K 4.2 12/17/2017 0835   K 4.1 12/03/2017 0741   CL 109 12/17/2017 0835   CO2 25 12/17/2017 0835   CO2 22 12/03/2017 0741   BUN 29 (H) 12/17/2017 0835   BUN 22.1 12/03/2017 0741   CREATININE 1.15 12/17/2017 0835   CREATININE 1.1 12/03/2017 0741      Component Value Date/Time   CALCIUM 9.3 12/17/2017 0835   CALCIUM 9.3 12/03/2017 0741   ALKPHOS 64 12/17/2017 0835   ALKPHOS 66 12/03/2017 0741   AST 19 12/17/2017 0835   AST 16 12/03/2017 0741   ALT 27 12/17/2017 0835   ALT 22 12/03/2017 0741   BILITOT 1.0 12/17/2017 0835   BILITOT 1.01 12/03/2017 0741      G ROSS AND MICROSCOPIC INFORMATION  Impression and Plan:   72 year old man with:   1. CLL: He presented with splenomegaly and small lymphadenopathy diagnosed in December 2014.   His most recent CT scan in December 2018 showed very little adenopathy change as well as his splenomegaly has not progressed. He remains asymptomatic at this time.  The natural course of this disease was reviewed again including indication for treatment. At this point we have elected to continue with active surveillance and institute anticancer therapy in the future in the future. He has been exposed to rituximab which have helped his  disease status.   2. Autoimmune hemolytic anemia related to CLL. He has been tapered off prednisone since December 2018 and remain on active surveillance. His hematocrit today of 39 which has not changed her indicate any autoimmune hemolysis.  I recommended continued observation and surveillance time being. Additional therapy may be needed in the future including immunosuppressive agents such as Imuran or possibly splenectomy.    3. Hyperglycemia:   4. Thrombocytopenia: Related to CLL: Autoimmune etiology. We will continue to follow closely and she will need steroids treatment if his platelets drop as well. His platelet count today is above 100,000 which is adequate.  5. Follow-up: Will be in 2 weeks from May 2019. Increasing the interval between labs will be considered at that time.  25 minutes was spent with the patient today face-to-face.  More than 50% of the time was dedicated to patient counseling, education and coordination of his multifaceted care.  Zola Button, MD 3/13/20197:55 AM

## 2018-02-23 DIAGNOSIS — Z713 Dietary counseling and surveillance: Secondary | ICD-10-CM | POA: Diagnosis not present

## 2018-02-23 DIAGNOSIS — C9190 Lymphoid leukemia, unspecified not having achieved remission: Secondary | ICD-10-CM | POA: Diagnosis not present

## 2018-02-23 DIAGNOSIS — Z299 Encounter for prophylactic measures, unspecified: Secondary | ICD-10-CM | POA: Diagnosis not present

## 2018-02-23 DIAGNOSIS — Z6826 Body mass index (BMI) 26.0-26.9, adult: Secondary | ICD-10-CM | POA: Diagnosis not present

## 2018-02-23 DIAGNOSIS — E1165 Type 2 diabetes mellitus with hyperglycemia: Secondary | ICD-10-CM | POA: Diagnosis not present

## 2018-02-25 ENCOUNTER — Telehealth: Payer: Self-pay | Admitting: *Deleted

## 2018-02-25 ENCOUNTER — Inpatient Hospital Stay: Payer: Medicare Other

## 2018-02-25 DIAGNOSIS — Z79899 Other long term (current) drug therapy: Secondary | ICD-10-CM | POA: Diagnosis not present

## 2018-02-25 DIAGNOSIS — D591 Other autoimmune hemolytic anemias: Secondary | ICD-10-CM | POA: Diagnosis not present

## 2018-02-25 DIAGNOSIS — R739 Hyperglycemia, unspecified: Secondary | ICD-10-CM | POA: Diagnosis not present

## 2018-02-25 DIAGNOSIS — C9110 Chronic lymphocytic leukemia of B-cell type not having achieved remission: Secondary | ICD-10-CM | POA: Diagnosis not present

## 2018-02-25 DIAGNOSIS — D696 Thrombocytopenia, unspecified: Secondary | ICD-10-CM | POA: Diagnosis not present

## 2018-02-25 DIAGNOSIS — D599 Acquired hemolytic anemia, unspecified: Secondary | ICD-10-CM

## 2018-02-25 LAB — CBC WITH DIFFERENTIAL (CANCER CENTER ONLY)
BASOS PCT: 2 %
Basophils Absolute: 0.2 10*3/uL — ABNORMAL HIGH (ref 0.0–0.1)
EOS ABS: 0.3 10*3/uL (ref 0.0–0.5)
Eosinophils Relative: 3 %
HCT: 41.8 % (ref 38.4–49.9)
HEMOGLOBIN: 14.6 g/dL (ref 13.0–17.1)
Lymphocytes Relative: 52 %
Lymphs Abs: 5.6 10*3/uL — ABNORMAL HIGH (ref 0.9–3.3)
MCH: 32.4 pg (ref 27.2–33.4)
MCHC: 34.8 g/dL (ref 32.0–36.0)
MCV: 93 fL (ref 79.3–98.0)
MONO ABS: 0.4 10*3/uL (ref 0.1–0.9)
MONOS PCT: 4 %
NEUTROS PCT: 39 %
Neutro Abs: 4.1 10*3/uL (ref 1.5–6.5)
Platelet Count: 121 10*3/uL — ABNORMAL LOW (ref 140–400)
RBC: 4.49 MIL/uL (ref 4.20–5.82)
RDW: 15 % — AB (ref 11.0–14.6)
WBC Count: 10.6 10*3/uL — ABNORMAL HIGH (ref 4.0–10.3)

## 2018-02-25 LAB — SAMPLE TO BLOOD BANK

## 2018-02-25 NOTE — Telephone Encounter (Signed)
Spoke with wife Juliann Pulse, gave results of last labs

## 2018-02-25 NOTE — Telephone Encounter (Signed)
-----   Message from Gotwalt Portela, MD sent at 02/25/2018  8:21 AM EDT ----- Please let him know his hgb is normal. He needs nothing

## 2018-03-02 DIAGNOSIS — Z299 Encounter for prophylactic measures, unspecified: Secondary | ICD-10-CM | POA: Diagnosis not present

## 2018-03-02 DIAGNOSIS — K5792 Diverticulitis of intestine, part unspecified, without perforation or abscess without bleeding: Secondary | ICD-10-CM | POA: Diagnosis not present

## 2018-03-02 DIAGNOSIS — Z6826 Body mass index (BMI) 26.0-26.9, adult: Secondary | ICD-10-CM | POA: Diagnosis not present

## 2018-03-02 DIAGNOSIS — Z713 Dietary counseling and surveillance: Secondary | ICD-10-CM | POA: Diagnosis not present

## 2018-03-02 DIAGNOSIS — C9190 Lymphoid leukemia, unspecified not having achieved remission: Secondary | ICD-10-CM | POA: Diagnosis not present

## 2018-03-11 ENCOUNTER — Inpatient Hospital Stay: Payer: Medicare Other | Attending: Oncology

## 2018-03-11 ENCOUNTER — Telehealth: Payer: Self-pay | Admitting: *Deleted

## 2018-03-11 DIAGNOSIS — D599 Acquired hemolytic anemia, unspecified: Secondary | ICD-10-CM | POA: Diagnosis not present

## 2018-03-11 LAB — CBC WITH DIFFERENTIAL (CANCER CENTER ONLY)
BASOS PCT: 1 %
Basophils Absolute: 0.1 10*3/uL (ref 0.0–0.1)
EOS ABS: 0.2 10*3/uL (ref 0.0–0.5)
EOS PCT: 2 %
HCT: 39.3 % (ref 38.4–49.9)
HEMOGLOBIN: 13.4 g/dL (ref 13.0–17.1)
LYMPHS ABS: 6.2 10*3/uL — AB (ref 0.9–3.3)
Lymphocytes Relative: 55 %
MCH: 30.9 pg (ref 27.2–33.4)
MCHC: 34.2 g/dL (ref 32.0–36.0)
MCV: 90.4 fL (ref 79.3–98.0)
MONOS PCT: 5 %
Monocytes Absolute: 0.6 10*3/uL (ref 0.1–0.9)
NEUTROS PCT: 37 %
Neutro Abs: 4.1 10*3/uL (ref 1.5–6.5)
PLATELETS: 140 10*3/uL (ref 140–400)
RBC: 4.35 MIL/uL (ref 4.20–5.82)
RDW: 15.3 % — ABNORMAL HIGH (ref 11.0–14.6)
WBC Count: 11.1 10*3/uL — ABNORMAL HIGH (ref 4.0–10.3)

## 2018-03-11 LAB — SAMPLE TO BLOOD BANK

## 2018-03-11 NOTE — Telephone Encounter (Signed)
As noted below by Dr. Alen Blew, I informed patient of his Hemoglobin level.

## 2018-03-11 NOTE — Telephone Encounter (Signed)
-----   Message from Lanphier Portela, MD sent at 03/11/2018  9:06 AM EDT ----- Please let him know his Hgb is normal.

## 2018-03-17 DIAGNOSIS — R5383 Other fatigue: Secondary | ICD-10-CM | POA: Diagnosis not present

## 2018-03-17 DIAGNOSIS — R918 Other nonspecific abnormal finding of lung field: Secondary | ICD-10-CM | POA: Diagnosis not present

## 2018-03-17 DIAGNOSIS — R079 Chest pain, unspecified: Secondary | ICD-10-CM | POA: Diagnosis not present

## 2018-03-25 ENCOUNTER — Telehealth: Payer: Self-pay | Admitting: Oncology

## 2018-03-25 ENCOUNTER — Inpatient Hospital Stay: Payer: Medicare Other

## 2018-03-25 ENCOUNTER — Telehealth: Payer: Self-pay | Admitting: *Deleted

## 2018-03-25 DIAGNOSIS — D599 Acquired hemolytic anemia, unspecified: Secondary | ICD-10-CM | POA: Diagnosis not present

## 2018-03-25 LAB — CBC WITH DIFFERENTIAL (CANCER CENTER ONLY)
BASOS PCT: 0 %
Basophils Absolute: 0 10*3/uL (ref 0.0–0.1)
EOS ABS: 0.2 10*3/uL (ref 0.0–0.5)
Eosinophils Relative: 2 %
HCT: 40.1 % (ref 38.4–49.9)
HEMOGLOBIN: 13.9 g/dL (ref 13.0–17.1)
Lymphocytes Relative: 52 %
Lymphs Abs: 5.4 10*3/uL — ABNORMAL HIGH (ref 0.9–3.3)
MCH: 31.9 pg (ref 27.2–33.4)
MCHC: 34.6 g/dL (ref 32.0–36.0)
MCV: 92.2 fL (ref 79.3–98.0)
MONO ABS: 0.6 10*3/uL (ref 0.1–0.9)
MONOS PCT: 6 %
NEUTROS ABS: 4.3 10*3/uL (ref 1.5–6.5)
Neutrophils Relative %: 40 %
Platelet Count: 123 10*3/uL — ABNORMAL LOW (ref 140–400)
RBC: 4.35 MIL/uL (ref 4.20–5.82)
RDW: 15.6 % — ABNORMAL HIGH (ref 11.0–14.6)
WBC Count: 10.6 10*3/uL — ABNORMAL HIGH (ref 4.0–10.3)

## 2018-03-25 LAB — SAMPLE TO BLOOD BANK

## 2018-03-25 NOTE — Telephone Encounter (Signed)
-----   Message from Mcsorley Portela, MD sent at 03/25/2018  8:37 AM EDT ----- Please let him know his Hgb is normal.

## 2018-03-25 NOTE — Telephone Encounter (Signed)
Spoke with patient, per dr Alen Blew, his hemoglobin is good at 13.9

## 2018-03-25 NOTE — Telephone Encounter (Signed)
Patient has requested appt for 5/8 with Dr Alen Blew due to being out of town on 5/22.  OK per sch msg from Dr Alen Blew 4/24

## 2018-04-08 ENCOUNTER — Inpatient Hospital Stay (HOSPITAL_BASED_OUTPATIENT_CLINIC_OR_DEPARTMENT_OTHER): Payer: Medicare Other | Admitting: Oncology

## 2018-04-08 ENCOUNTER — Ambulatory Visit: Payer: Medicare Other | Admitting: Oncology

## 2018-04-08 ENCOUNTER — Inpatient Hospital Stay: Payer: Medicare Other | Attending: Oncology

## 2018-04-08 ENCOUNTER — Telehealth: Payer: Self-pay

## 2018-04-08 ENCOUNTER — Other Ambulatory Visit: Payer: Medicare Other

## 2018-04-08 VITALS — BP 125/67 | HR 56 | Temp 97.8°F | Resp 18 | Ht 72.0 in | Wt 190.7 lb

## 2018-04-08 DIAGNOSIS — D696 Thrombocytopenia, unspecified: Secondary | ICD-10-CM | POA: Insufficient documentation

## 2018-04-08 DIAGNOSIS — Z79899 Other long term (current) drug therapy: Secondary | ICD-10-CM | POA: Diagnosis not present

## 2018-04-08 DIAGNOSIS — C9110 Chronic lymphocytic leukemia of B-cell type not having achieved remission: Secondary | ICD-10-CM

## 2018-04-08 DIAGNOSIS — D591 Other autoimmune hemolytic anemias: Secondary | ICD-10-CM | POA: Insufficient documentation

## 2018-04-08 DIAGNOSIS — D599 Acquired hemolytic anemia, unspecified: Secondary | ICD-10-CM

## 2018-04-08 LAB — CBC WITH DIFFERENTIAL (CANCER CENTER ONLY)
Basophils Absolute: 0 10*3/uL (ref 0.0–0.1)
Basophils Relative: 0 %
EOS PCT: 3 %
Eosinophils Absolute: 0.3 10*3/uL (ref 0.0–0.5)
HCT: 42.2 % (ref 38.4–49.9)
Hemoglobin: 14.5 g/dL (ref 13.0–17.1)
LYMPHS PCT: 62 %
Lymphs Abs: 6.3 10*3/uL — ABNORMAL HIGH (ref 0.9–3.3)
MCH: 31.5 pg (ref 27.2–33.4)
MCHC: 34.5 g/dL (ref 32.0–36.0)
MCV: 91.4 fL (ref 79.3–98.0)
MONO ABS: 0.8 10*3/uL (ref 0.1–0.9)
MONOS PCT: 8 %
Neutro Abs: 2.7 10*3/uL (ref 1.5–6.5)
Neutrophils Relative %: 27 %
PLATELETS: 128 10*3/uL — AB (ref 140–400)
RBC: 4.62 MIL/uL (ref 4.20–5.82)
RDW: 15.4 % — ABNORMAL HIGH (ref 11.0–14.6)
WBC Count: 10.1 10*3/uL (ref 4.0–10.3)

## 2018-04-08 LAB — SAMPLE TO BLOOD BANK

## 2018-04-08 NOTE — Telephone Encounter (Signed)
Printed avs and calender of upcoming appointment. Per 5/8 los

## 2018-04-08 NOTE — Progress Notes (Signed)
Hematology and Oncology Follow Up Visit  Richard Evans [id number] [birth year] 72 y.o. 04/08/2018 8:14 AM [name], MDVyas, Dhruv B, MD   Principle Diagnosis: 72 year old man with:  1. CLL: Presented with lymphadenopathy, splenomegaly and autoimmune hemolytic anemia in 2017. Bone marrow biopsy confirmed the diagnosis.   2. Autoimmune hemolytic anemia diagnosed in July 2017.  He is currently in remission.    Prior Therapy:    Prednisone 70 mg daily started on 06/30/2016. He achieved complete response and prednisone tapered completely off on 09/11/2016. He relapsed in October 2017.  Prednisone 90 mg daily started on 09/30/2016. He achieved complete response on 10/17/2016. He completed prednisone taper on 12/18/2016.  Rituximab at 375 mg/m weekly started on 08/08/2017. He is status post 4 weeks of therapy completed on 08/27/2017.  Prednisone 60 mg daily started on 09/17/2017.  He was tapered off in December 2018 after achieving a complete response.  Current therapy: Active surveillance.   Interim History:  Richard Evans is here for a follow-up.  Since the last visit, he reports no changes in his health.  He denies any excessive fatigue, tiredness or recent exacerbation of his condition.  He denies any constitutional symptoms of weight loss or lymphadenopathy.  He did develop an episode of chest pain after lifting heavy objects and was evaluated in the emergency department and felt to be musculoskeletal in nature.  He is no longer reporting any chest pain and has not had any shortness of breath.   He does not report any headaches, blurry vision, syncope or seizures. He is not report any fevers or chills or weight loss.  Appetite and performance status remains excellent.  He does not report any palpitation, orthopnea or leg edema. He does not report any cough, wheezing or hemoptysis. He does not report any nausea, vomiting or abdominal pain. He does not report any frequency urgency or  hesitancy. He has not reported any arthralgias or myalgias.  He does not report any lymphadenopathy or petechiae.  He denies any ecchymosis or petechiae.  Remaining review of systems is negative.    Medications: I have reviewed the patient's current medications.  Current Outpatient Medications  Medication Sig Dispense Refill  . atenolol (TENORMIN) 25 MG tablet Take 25 mg by mouth at bedtime.     . Cholecalciferol (VITAMIN D) 2000 units CAPS Take 1 capsule by mouth daily.    . diphenhydrAMINE (BENADRYL) 25 mg capsule Take 25 mg by mouth every 6 (six) hours as needed.    . folic acid (FOLVITE) 1 MG tablet TAKE ONE TABLET BY MOUTH EVERY DAY 30 tablet 2  . pantoprazole (PROTONIX) 40 MG tablet Take 40 mg by mouth daily.  1  . polyethylene glycol powder (CLEARLAX) powder Take 17 g by mouth daily as needed (constipation).    . Tamsulosin HCl (FLOMAX) 0.4 MG CAPS Take 0.4 mg by mouth at bedtime.      No current facility-administered medications for this visit.      Allergies:  Allergies  Allergen Reactions  . Adhesive [Tape] Itching and Rash    Paper tape or wrapped bandage is preferred    Past Medical History, Surgical history, Social history, and Family History discussed today and unchanged from previous examination.  Physical Exam: Blood pressure 125/67, pulse (!) 56, temperature 97.8 F (36.6 C), temperature source Oral, resp. rate 18, height 6' (1.829 m), weight 190 lb 11.2 oz (86.5 kg), SpO2 95 %.   ECOG: 0 General appearance: Well-appearing gentleman without distress.  Head: Atraumatic without abnormalities. Oropharynx: Oral mucosa is moist and pink without any thrush or ulcers. Eyes: Sclera anicteric. Lymph nodes: No cervical, axillary or supraclavicular lymphadenopathy. Heart: Regular rate without murmurs or gallops. Lung: Clear without any rhonchi, wheezes or dullness to percussion. Abdomin: Soft, without any rebound or guarding.  No shifting dullness or  ascites. Musculoskeletal: No joint deformity or effusion. Skin: No rashes or lesions. Neurological: Intact motor, sensory deficits.  Lab Results: Lab Results  Component Value Date   WBC 10.1 04/08/2018   HGB 14.5 04/08/2018   HCT 42.2 04/08/2018   MCV 91.4 04/08/2018   PLT 128 (L) 04/08/2018     Chemistry      Component Value Date/Time   NA 142 12/17/2017 0835   NA 141 12/03/2017 0741   K 4.2 12/17/2017 0835   K 4.1 12/03/2017 0741   CL 109 12/17/2017 0835   CO2 25 12/17/2017 0835   CO2 22 12/03/2017 0741   BUN 29 (H) 12/17/2017 0835   BUN 22.1 12/03/2017 0741   CREATININE 1.15 12/17/2017 0835   CREATININE 1.1 12/03/2017 0741      Component Value Date/Time   CALCIUM 9.3 12/17/2017 0835   CALCIUM 9.3 12/03/2017 0741   ALKPHOS 64 12/17/2017 0835   ALKPHOS 66 12/03/2017 0741   AST 19 12/17/2017 0835   AST 16 12/03/2017 0741   ALT 27 12/17/2017 0835   ALT 22 12/03/2017 0741   BILITOT 1.0 12/17/2017 0835   BILITOT 1.01 12/03/2017 0741      G ROSS AND MICROSCOPIC INFORMATION  Impression and Plan:   72 year old man with:   1. CLL diagnosed in 2007 after presenting with the splenomegaly and hemolytic anemia.  Imaging studies in December 2018 showed no bulky adenopathy.  He remains completely asymptomatic from this disease without indication for treatments.  Indication to treat were reviewed again with the patient which include cytopenias, adenopathy, constitutional symptoms among others.  After discussion today we will continue with active surveillance at this time.   2. Autoimmune hemolytic anemia: He responded to prednisone with a complete response at this time.  Hemoglobin remains within normal range.  His autoimmune hemolytic anemia is related to CLL.  We have discussed possibility of splenectomy if needed to in the future.    3. Hyperglycemia: Resolved at this time after stopping prednisone.  4. Thrombocytopenia: Platelet count remains adequate at this  time.  Thrombocytopenia is likely related to CLL.  5. Follow-up: Will be in 3 weeks to repeat labs and in 6 weeks for an MD follow-up.  15 minutes was spent with the patient today face-to-face.  More than 50% of the time was dedicated to patient counseling, education and answering questions regarding future plan of care.  Zola Button, MD 5/8/20198:14 AM

## 2018-04-08 NOTE — Telephone Encounter (Signed)
Called to verify 5/22 lab was canceled per 5/8 [facility] verbal request.

## 2018-04-08 NOTE — Addendum Note (Signed)
Addended by: Randolm Idol on: 04/08/2018 08:30 AM   Modules accepted: Orders

## 2018-04-22 ENCOUNTER — Ambulatory Visit: Payer: Medicare Other | Admitting: Oncology

## 2018-04-22 ENCOUNTER — Other Ambulatory Visit: Payer: Medicare Other

## 2018-04-29 ENCOUNTER — Inpatient Hospital Stay: Payer: Medicare Other

## 2018-04-29 ENCOUNTER — Telehealth: Payer: Self-pay | Admitting: *Deleted

## 2018-04-29 DIAGNOSIS — D599 Acquired hemolytic anemia, unspecified: Secondary | ICD-10-CM

## 2018-04-29 DIAGNOSIS — Z79899 Other long term (current) drug therapy: Secondary | ICD-10-CM | POA: Diagnosis not present

## 2018-04-29 DIAGNOSIS — D696 Thrombocytopenia, unspecified: Secondary | ICD-10-CM | POA: Diagnosis not present

## 2018-04-29 DIAGNOSIS — C9110 Chronic lymphocytic leukemia of B-cell type not having achieved remission: Secondary | ICD-10-CM | POA: Diagnosis not present

## 2018-04-29 DIAGNOSIS — D591 Other autoimmune hemolytic anemias: Secondary | ICD-10-CM | POA: Diagnosis not present

## 2018-04-29 LAB — CBC WITH DIFFERENTIAL (CANCER CENTER ONLY)
BASOS PCT: 0 %
Basophils Absolute: 0 10*3/uL (ref 0.0–0.1)
EOS ABS: 0.3 10*3/uL (ref 0.0–0.5)
EOS PCT: 2 %
HCT: 40.1 % (ref 38.4–49.9)
HEMOGLOBIN: 14.2 g/dL (ref 13.0–17.1)
LYMPHS ABS: 6.6 10*3/uL — AB (ref 0.9–3.3)
Lymphocytes Relative: 55 %
MCH: 32.6 pg (ref 27.2–33.4)
MCHC: 35.4 g/dL (ref 32.0–36.0)
MCV: 92 fL (ref 79.3–98.0)
MONOS PCT: 8 %
Monocytes Absolute: 1 10*3/uL — ABNORMAL HIGH (ref 0.1–0.9)
NEUTROS PCT: 35 %
Neutro Abs: 4.2 10*3/uL (ref 1.5–6.5)
PLATELETS: 122 10*3/uL — AB (ref 140–400)
RBC: 4.36 MIL/uL (ref 4.20–5.82)
RDW: 14.8 % — ABNORMAL HIGH (ref 11.0–14.6)
WBC Count: 12 10*3/uL — ABNORMAL HIGH (ref 4.0–10.3)

## 2018-04-29 LAB — SAMPLE TO BLOOD BANK

## 2018-04-29 NOTE — Telephone Encounter (Signed)
As noted below by Dr. Alen Blew, I informed patient of his hemoglobin. Instructed him to call if he has any concerns.

## 2018-04-29 NOTE — Telephone Encounter (Signed)
-----   Message from Consiglio Portela, MD sent at 04/29/2018 11:22 AM EDT ----- Please let him know his Hgb is normal.

## 2018-05-06 DIAGNOSIS — Z6826 Body mass index (BMI) 26.0-26.9, adult: Secondary | ICD-10-CM | POA: Diagnosis not present

## 2018-05-06 DIAGNOSIS — J069 Acute upper respiratory infection, unspecified: Secondary | ICD-10-CM | POA: Diagnosis not present

## 2018-05-06 DIAGNOSIS — Z87891 Personal history of nicotine dependence: Secondary | ICD-10-CM | POA: Diagnosis not present

## 2018-05-06 DIAGNOSIS — I10 Essential (primary) hypertension: Secondary | ICD-10-CM | POA: Diagnosis not present

## 2018-05-06 DIAGNOSIS — Z713 Dietary counseling and surveillance: Secondary | ICD-10-CM | POA: Diagnosis not present

## 2018-05-06 DIAGNOSIS — Z299 Encounter for prophylactic measures, unspecified: Secondary | ICD-10-CM | POA: Diagnosis not present

## 2018-05-20 ENCOUNTER — Other Ambulatory Visit: Payer: Medicare Other

## 2018-05-20 ENCOUNTER — Inpatient Hospital Stay: Payer: Medicare Other | Attending: Oncology | Admitting: Oncology

## 2018-05-20 ENCOUNTER — Telehealth: Payer: Self-pay | Admitting: Oncology

## 2018-05-20 ENCOUNTER — Inpatient Hospital Stay: Payer: Medicare Other

## 2018-05-20 VITALS — BP 131/75 | HR 57 | Temp 98.6°F | Ht 72.0 in | Wt 191.2 lb

## 2018-05-20 DIAGNOSIS — D591 Other autoimmune hemolytic anemias: Secondary | ICD-10-CM

## 2018-05-20 DIAGNOSIS — R161 Splenomegaly, not elsewhere classified: Secondary | ICD-10-CM | POA: Diagnosis not present

## 2018-05-20 DIAGNOSIS — C9111 Chronic lymphocytic leukemia of B-cell type in remission: Secondary | ICD-10-CM | POA: Diagnosis not present

## 2018-05-20 DIAGNOSIS — Z79899 Other long term (current) drug therapy: Secondary | ICD-10-CM

## 2018-05-20 DIAGNOSIS — D599 Acquired hemolytic anemia, unspecified: Secondary | ICD-10-CM

## 2018-05-20 DIAGNOSIS — D696 Thrombocytopenia, unspecified: Secondary | ICD-10-CM | POA: Insufficient documentation

## 2018-05-20 DIAGNOSIS — C9110 Chronic lymphocytic leukemia of B-cell type not having achieved remission: Secondary | ICD-10-CM

## 2018-05-20 LAB — CBC WITH DIFFERENTIAL (CANCER CENTER ONLY)
BASOS ABS: 0.1 10*3/uL (ref 0.0–0.1)
BASOS PCT: 1 %
EOS ABS: 0.2 10*3/uL (ref 0.0–0.5)
Eosinophils Relative: 1 %
HEMATOCRIT: 41.8 % (ref 38.4–49.9)
HEMOGLOBIN: 14.2 g/dL (ref 13.0–17.1)
Lymphocytes Relative: 56 %
Lymphs Abs: 7.6 10*3/uL — ABNORMAL HIGH (ref 0.9–3.3)
MCH: 31.4 pg (ref 27.2–33.4)
MCHC: 34 g/dL (ref 32.0–36.0)
MCV: 92.2 fL (ref 79.3–98.0)
MONOS PCT: 5 %
Monocytes Absolute: 0.6 10*3/uL (ref 0.1–0.9)
NEUTROS ABS: 4.9 10*3/uL (ref 1.5–6.5)
NEUTROS PCT: 37 %
Platelet Count: 138 10*3/uL — ABNORMAL LOW (ref 140–400)
RBC: 4.54 MIL/uL (ref 4.20–5.82)
RDW: 15.5 % — ABNORMAL HIGH (ref 11.0–14.6)
WBC: 13.5 10*3/uL — AB (ref 4.0–10.3)

## 2018-05-20 NOTE — Telephone Encounter (Signed)
Appointments scheduled AVS/Calendar printed per 6/19 los °

## 2018-05-20 NOTE — Progress Notes (Signed)
Hematology and Oncology Follow Up Visit  Richard Evans [id number] 07-14-1946 72 y.o. 05/20/2018 7:52 AM Richard Evans, MDVyas, Dhruv B, MD   Principle Diagnosis: 72 year old man with:  1. CLL diagnosed in 2017 after presenting with small lymphadenopathy and autoimmune hemolytic anemia.  He has not received treatment at this time.   2. Autoimmune hemolytic anemia that is currently in remission after initial diagnosed in July 2017.      Prior Therapy:    Prednisone 70 mg daily started on 06/30/2016. He achieved complete response and prednisone tapered completely off on 09/11/2016. He relapsed in October 2017.  Prednisone 90 mg daily started on 09/30/2016. He achieved complete response on 10/17/2016. He completed prednisone taper on 12/18/2016.  Rituximab at 375 mg/m weekly started on 08/08/2017. He is status post 4 weeks of therapy completed on 08/27/2017.  Prednisone 60 mg daily started on 09/17/2017.  He was tapered off in December 2018 after achieving a complete response.  Current therapy: Active surveillance.    Interim History:  Richard Evans returns today for a follow-up.  Since the last visit, he reports no major changes or complaints.  He was diagnosed with upper respiratory tract infection that has recovered at this time.  He denies any cough or dyspnea on exertion at this time but did have an episode of sinus congestion that is resolving.  He remains active and attends to activities of daily living.  He is able to work outside and remain very busy.  He denies any painful adenopathy or constitutional symptoms.  He denies any excessive fatigue or tiredness.  He denies any excessive weakness.  His upper respiratory infection appears to be viral in nature and did not require any hospitalization.  No opportunistic infection noted or hospitalizations.  He does not report any headaches, blurry vision, syncope or seizures.  He denies any alteration in mental status or confusion.  He is not  report any fevers or chills or weight loss.  He denies any chest pain or dyspnea on exertion.   He does not report any palpitation, orthopnea or leg edema. He does not report any cough, wheezing or hemoptysis. He does not report any nausea, vomiting or abdominal pain.  He denies any constipation or diarrhea.  He does not report any frequency urgency or hesitancy. He has not reported any arthralgias or myalgias.  He does not report any lymphadenopathy or petechiae.  He denies any ecchymosis or petechiae.  He denies any excessive bleeding or thrombosis.  Remaining review of systems is negative.    Medications: I have reviewed the patient's current medications.  Current Outpatient Medications  Medication Sig Dispense Refill  . atenolol (TENORMIN) 25 MG tablet Take 25 mg by mouth at bedtime.     . Cholecalciferol (VITAMIN D) 2000 units CAPS Take 1 capsule by mouth daily.    . diphenhydrAMINE (BENADRYL) 25 mg capsule Take 25 mg by mouth every 6 (six) hours as needed.    . folic acid (FOLVITE) 1 MG tablet TAKE ONE TABLET BY MOUTH EVERY DAY 30 tablet 2  . polyethylene glycol powder (CLEARLAX) powder Take 17 g by mouth daily as needed (constipation).    . Tamsulosin HCl (FLOMAX) 0.4 MG CAPS Take 0.4 mg by mouth at bedtime.      No current facility-administered medications for this visit.      Allergies:  Allergies  Allergen Reactions  . Adhesive [Tape] Itching and Rash    Paper tape or wrapped bandage is preferred  Past Medical History, Surgical history, Social history, and Family History discussed today and unchanged from previous examination.  Physical Exam:  Blood pressure 131/75, pulse (!) 57, temperature 98.6 F (37 C), temperature source Oral, height 6' (1.829 m), weight 191 lb 3.2 oz (86.7 kg), SpO2 100 %.   ECOG: 0 General appearance: Alert, awake gentleman without distress. Head: Normocephalic without abnormalities. Oropharynx: Mucous membranes are moist and pink without any  thrush or ulcers. Eyes: Pupils are equal and reactive to light. Lymph nodes: No no lymphadenopathy noted in the cervical, axillary or supraclavicular regions. Heart: Regular rate without any murmurs or gallops.  S1 and S2.  No leg edema. Lung: Clear in all lung fields without any rhonchi, wheezes or dullness to percussion. Abdomin: Soft, without any rebound or guarding.  No shifting dullness or ascites. Musculoskeletal: No clubbing or cyanosis.  Full range of motion noted all joints. Skin: Skin is moist without any ecchymosis.. Neurological: No motor or sensory deficits.  Lab Results: Lab Results  Component Value Date   WBC 12.0 (H) 04/29/2018   HGB 14.2 04/29/2018   HCT 40.1 04/29/2018   MCV 92.0 04/29/2018   PLT 122 (L) 04/29/2018     Chemistry      Component Value Date/Time   NA 142 12/17/2017 0835   NA 141 12/03/2017 0741   K 4.2 12/17/2017 0835   K 4.1 12/03/2017 0741   CL 109 12/17/2017 0835   CO2 25 12/17/2017 0835   CO2 22 12/03/2017 0741   BUN 29 (H) 12/17/2017 0835   BUN 22.1 12/03/2017 0741   CREATININE 1.15 12/17/2017 0835   CREATININE 1.1 12/03/2017 0741      Component Value Date/Time   CALCIUM 9.3 12/17/2017 0835   CALCIUM 9.3 12/03/2017 0741   ALKPHOS 64 12/17/2017 0835   ALKPHOS 66 12/03/2017 0741   AST 19 12/17/2017 0835   AST 16 12/03/2017 0741   ALT 27 12/17/2017 0835   ALT 22 12/03/2017 0741   BILITOT 1.0 12/17/2017 0835   BILITOT 1.01 12/03/2017 0741      G ROSS AND MICROSCOPIC INFORMATION  Impression and Plan:   72 year old man with:   1.  Stage 1 CLL diagnosed in 2017.  He presented with splenomegaly and autoimmune hemolytic anemia.  Staging work-up revealed very small lymphadenopathy and bone marrow involvement that confirmed the diagnosis.  The natural course of this disease was reviewed today with the patient and his family.  Indication to treatment were reviewed today which includes autoimmune anemia or thrombocytopenia in  addition to constitutional symptoms.  Options of treatment were also discussed which includes chemotherapy, monoclonal antibody in the form of rituximab or oral targeted therapy with ibrutinib.  After discussion today, continues to defer the option of treatment at this time as he is completely asymptomatic.  I have recommended continued close observation and surveillance for the time being.   2. Autoimmune hemolytic anemia: He remains in remission without any evidence of relapse.  If tolerated the most recent steroid taper that ended in December 2018.  I recommended continue monitoring periodically.   3. Thrombocytopenia: Platelet count slightly decreased but very close to normal range.  No active bleeding and no further decline noted.  4.  Upper respiratory tract infection: Resolved at this time appears to be viral in etiology.  5. Follow-up: Will be in 4 weeks for a CBC in 8 weeks for an MD follow-up.  15 minutes was spent with the patient today face-to-face.  More than 50%  of the time was dedicated to patient counseling, education and discussing the natural course of disease as well as future treatment options.  Zola Button, MD 6/19/20197:52 AM

## 2018-06-01 DIAGNOSIS — Z299 Encounter for prophylactic measures, unspecified: Secondary | ICD-10-CM | POA: Diagnosis not present

## 2018-06-01 DIAGNOSIS — E1165 Type 2 diabetes mellitus with hyperglycemia: Secondary | ICD-10-CM | POA: Diagnosis not present

## 2018-06-01 DIAGNOSIS — Z6826 Body mass index (BMI) 26.0-26.9, adult: Secondary | ICD-10-CM | POA: Diagnosis not present

## 2018-06-01 DIAGNOSIS — I10 Essential (primary) hypertension: Secondary | ICD-10-CM | POA: Diagnosis not present

## 2018-06-01 DIAGNOSIS — C9190 Lymphoid leukemia, unspecified not having achieved remission: Secondary | ICD-10-CM | POA: Diagnosis not present

## 2018-06-17 ENCOUNTER — Inpatient Hospital Stay: Payer: Medicare Other | Attending: Oncology

## 2018-06-17 DIAGNOSIS — D599 Acquired hemolytic anemia, unspecified: Secondary | ICD-10-CM

## 2018-06-17 DIAGNOSIS — D591 Other autoimmune hemolytic anemias: Secondary | ICD-10-CM | POA: Diagnosis not present

## 2018-06-17 LAB — CBC WITH DIFFERENTIAL (CANCER CENTER ONLY)
Basophils Absolute: 0.1 10*3/uL (ref 0.0–0.1)
Basophils Relative: 1 %
EOS ABS: 0.2 10*3/uL (ref 0.0–0.5)
EOS PCT: 2 %
HCT: 41.6 % (ref 38.4–49.9)
HEMOGLOBIN: 14.5 g/dL (ref 13.0–17.1)
LYMPHS ABS: 6.9 10*3/uL — AB (ref 0.9–3.3)
Lymphocytes Relative: 59 %
MCH: 32.5 pg (ref 27.2–33.4)
MCHC: 34.9 g/dL (ref 32.0–36.0)
MCV: 93.1 fL (ref 79.3–98.0)
MONOS PCT: 5 %
Monocytes Absolute: 0.5 10*3/uL (ref 0.1–0.9)
NEUTROS PCT: 33 %
Neutro Abs: 3.8 10*3/uL (ref 1.5–6.5)
PLATELETS: 133 10*3/uL — AB (ref 140–400)
RBC: 4.47 MIL/uL (ref 4.20–5.82)
RDW: 16 % — AB (ref 11.0–14.6)
WBC Count: 11.5 10*3/uL — ABNORMAL HIGH (ref 4.0–10.3)

## 2018-06-17 LAB — SAMPLE TO BLOOD BANK

## 2018-07-29 ENCOUNTER — Telehealth: Payer: Self-pay

## 2018-07-29 ENCOUNTER — Inpatient Hospital Stay (HOSPITAL_BASED_OUTPATIENT_CLINIC_OR_DEPARTMENT_OTHER): Payer: Medicare Other | Admitting: Oncology

## 2018-07-29 ENCOUNTER — Inpatient Hospital Stay: Payer: Medicare Other | Attending: Oncology

## 2018-07-29 VITALS — BP 144/72 | HR 62 | Temp 98.3°F | Resp 17 | Ht 72.0 in | Wt 192.8 lb

## 2018-07-29 DIAGNOSIS — D696 Thrombocytopenia, unspecified: Secondary | ICD-10-CM

## 2018-07-29 DIAGNOSIS — D591 Other autoimmune hemolytic anemias: Secondary | ICD-10-CM | POA: Diagnosis not present

## 2018-07-29 DIAGNOSIS — C9110 Chronic lymphocytic leukemia of B-cell type not having achieved remission: Secondary | ICD-10-CM

## 2018-07-29 DIAGNOSIS — Z79899 Other long term (current) drug therapy: Secondary | ICD-10-CM

## 2018-07-29 DIAGNOSIS — C9111 Chronic lymphocytic leukemia of B-cell type in remission: Secondary | ICD-10-CM | POA: Diagnosis not present

## 2018-07-29 DIAGNOSIS — D599 Acquired hemolytic anemia, unspecified: Secondary | ICD-10-CM

## 2018-07-29 LAB — CBC WITH DIFFERENTIAL (CANCER CENTER ONLY)
BASOS PCT: 1 %
Basophils Absolute: 0.1 10*3/uL (ref 0.0–0.1)
Eosinophils Absolute: 0.2 10*3/uL (ref 0.0–0.5)
Eosinophils Relative: 3 %
HCT: 36.8 % — ABNORMAL LOW (ref 38.4–49.9)
Hemoglobin: 12.9 g/dL — ABNORMAL LOW (ref 13.0–17.1)
LYMPHS ABS: 4.2 10*3/uL — AB (ref 0.9–3.3)
Lymphocytes Relative: 42 %
MCH: 33.6 pg — AB (ref 27.2–33.4)
MCHC: 35.2 g/dL (ref 32.0–36.0)
MCV: 95.4 fL (ref 79.3–98.0)
Monocytes Absolute: 0.8 10*3/uL (ref 0.1–0.9)
Monocytes Relative: 9 %
Neutro Abs: 4.7 10*3/uL (ref 1.5–6.5)
Neutrophils Relative %: 45 %
PLATELETS: 138 10*3/uL — AB (ref 140–400)
RBC: 3.86 MIL/uL — AB (ref 4.20–5.82)
RDW: 14.3 % (ref 11.0–14.6)
WBC: 10 10*3/uL (ref 4.0–10.3)

## 2018-07-29 LAB — SAMPLE TO BLOOD BANK

## 2018-07-29 NOTE — Progress Notes (Signed)
Hematology and Oncology Follow Up Visit  Richard Evans [id number] 1946/07/27 72 y.o. 07/29/2018 8:16 AM [name], MDVyas, Richard B, MD   Principle Diagnosis: 72 year old man with:  1.  Stage 1 CLL diagnosed in 2017.  He presented with lymphocytosis and autoimmune hemolytic anemia and has not received any treatment since his diagnosis.  2. Autoimmune hemolytic anemia: This is related to his CLL and remains in remission after treatments with prednisone.    Prior Therapy:    Prednisone 70 mg daily started on 06/30/2016. He achieved complete response and prednisone tapered completely off on 09/11/2016. He relapsed in October 2017.  Prednisone 90 mg daily started on 09/30/2016. He achieved complete response on 10/17/2016. He completed prednisone taper on 12/18/2016.  Rituximab at 375 mg/m weekly started on 08/08/2017. He is status post 4 weeks of therapy completed on 08/27/2017.  Prednisone 60 mg daily started on 09/17/2017.  He was tapered off in December 2018 after achieving a complete response.  Current therapy: Active surveillance.    Interim History:  Richard Evans presents today for a follow-up visit.  Since the last visit, he reports no major changes in his health.  He was able to travel and spend time at the beach without any major complaints or decline in his health.  He was diagnosed with diverticulitis which has resolved at this time.  He denies any excessive fatigue, tiredness or dyspnea on exertion.  He denies any hematochezia or melena.  Continues to be active and performs activities of daily living.  He does not report any headaches, blurry vision, syncope or seizures.  He denies any dizziness.  He is not report any fevers or chills or weight loss.  He denies any chest pain or dyspnea on exertion.   He does not report any palpitation, orthopnea or leg edema. He does not report any cough, wheezing or hemoptysis. He does not report any nausea, vomiting.  He denies any changes in  bowel habits.  He does not report any frequency urgency or hesitancy. He has not reported any joint pain or deformity.  He does not report any lymphadenopathy or petechiae.  He denies any ecchymosis or petechiae.  He denies any bleeding or clotting tendency.  Remaining review of systems is negative.    Medications: I have reviewed the patient's current medications.  Current Outpatient Medications  Medication Sig Dispense Refill  . atenolol (TENORMIN) 25 MG tablet Take 25 mg by mouth at bedtime.     . Cholecalciferol (VITAMIN D) 2000 units CAPS Take 1 capsule by mouth daily.    . diphenhydrAMINE (BENADRYL) 25 mg capsule Take 25 mg by mouth every 6 (six) hours as needed.    . folic acid (FOLVITE) 1 MG tablet TAKE ONE TABLET BY MOUTH EVERY DAY 30 tablet 2  . polyethylene glycol powder (CLEARLAX) powder Take 17 g by mouth daily as needed (constipation).    . Tamsulosin HCl (FLOMAX) 0.4 MG CAPS Take 0.4 mg by mouth at bedtime.      No current facility-administered medications for this visit.      Allergies:  Allergies  Allergen Reactions  . Adhesive [Tape] Itching and Rash    Paper tape or wrapped bandage is preferred    Past Medical History, Surgical history, Social history, and Family History discussed today and unchanged from previous examination.  Physical Exam:  Blood pressure (!) 144/72, pulse 62, temperature 98.3 F (36.8 C), temperature source Oral, resp. rate 17, height 6' (1.829 m), weight 192  lb 12.8 oz (87.5 kg), SpO2 97 %.   ECOG: 0   General appearance: Comfortable appearing without any discomfort Head: Normocephalic without any trauma Oropharynx: No mucosal bleeding or ulcers noted on exam. Eyes: Pupils are equal and round reactive to light.  Sclera anicteric. Lymph nodes: No cervical, supraclavicular, inguinal or axillary lymphadenopathy.   Heart:regular rate and rhythm.  S1 and S2 without leg edema. Lung: Clear without any rhonchi or wheezes.  No dullness to  percussion. Abdomin: Soft, nontender, nondistended with good bowel sounds.  No hepatosplenomegaly. Musculoskeletal: No joint deformity or effusion.  Full range of motion noted. Neurological: No deficits noted on motor, sensory and deep tendon reflex exam. Skin: No petechial rash or dryness.  Appeared moist.  Psychiatric: Mood and affect appeared appropriate.    Lab Results: Lab Results  Component Value Date   WBC 10.0 07/29/2018   HGB 12.9 (L) 07/29/2018   HCT 36.8 (L) 07/29/2018   MCV 95.4 07/29/2018   PLT 138 (L) 07/29/2018     Chemistry      Component Value Date/Time   NA 142 12/17/2017 0835   NA 141 12/03/2017 0741   K 4.2 12/17/2017 0835   K 4.1 12/03/2017 0741   CL 109 12/17/2017 0835   CO2 25 12/17/2017 0835   CO2 22 12/03/2017 0741   BUN 29 (H) 12/17/2017 0835   BUN 22.1 12/03/2017 0741   CREATININE 1.15 12/17/2017 0835   CREATININE 1.1 12/03/2017 0741      Component Value Date/Time   CALCIUM 9.3 12/17/2017 0835   CALCIUM 9.3 12/03/2017 0741   ALKPHOS 64 12/17/2017 0835   ALKPHOS 66 12/03/2017 0741   AST 19 12/17/2017 0835   AST 16 12/03/2017 0741   ALT 27 12/17/2017 0835   ALT 22 12/03/2017 0741   BILITOT 1.0 12/17/2017 0835   BILITOT 1.01 12/03/2017 0741      G ROSS AND MICROSCOPIC INFORMATION  Impression and Plan:   72 year old man with:   1.  CLL diagnosed in 2017 after presenting with mild splenomegaly and autoimmune hemolytic anemia.  He remains asymptomatic from his disease without any painful lymphadenopathy or constitutional symptoms.    The natural course of this disease was reviewed again and indication for treatment were updated.  At this time he does not have any indication for treatment unless he develops any recurrent hemolytic anemia, painful adenopathy or constitutional symptoms.  For the time being we will continue active surveillance and repeat laboratory testing periodically have clinical exam in 2 months.   2. Autoimmune  hemolytic anemia: His hemoglobin from today was reviewed and showed a mild drop.  This could be reactive minor process although it could be the beginning of relapsing hemolytic anemia.  We will continue to monitor him closely regarding this issue and repeat laboratory testing in 2 weeks.  Retreatment with steroids or splenectomy could be a consideration if he relapses.   3. Thrombocytopenia: Platelet count remains stable without any active bleeding.  We will continue to monitor his counts moving forward.  4. Follow-up: Will be every 2 weeks and MD follow-up in 8 weeks.  15 minutes was spent with the patient today face-to-face.  More than 50% of the time was spent on discussing the natural course of his disease, reviewing laboratory data and coordinating his plan of care.  Zola Button, MD 8/28/20198:16 AM

## 2018-07-29 NOTE — Telephone Encounter (Signed)
Printed  avs and calender of upcoming appointment. Per 8/28 los 

## 2018-07-30 DIAGNOSIS — I517 Cardiomegaly: Secondary | ICD-10-CM | POA: Diagnosis not present

## 2018-07-30 DIAGNOSIS — C9190 Lymphoid leukemia, unspecified not having achieved remission: Secondary | ICD-10-CM | POA: Diagnosis not present

## 2018-07-30 DIAGNOSIS — R05 Cough: Secondary | ICD-10-CM | POA: Diagnosis not present

## 2018-07-30 DIAGNOSIS — I10 Essential (primary) hypertension: Secondary | ICD-10-CM | POA: Diagnosis not present

## 2018-07-30 DIAGNOSIS — R509 Fever, unspecified: Secondary | ICD-10-CM | POA: Diagnosis not present

## 2018-07-30 DIAGNOSIS — Z6826 Body mass index (BMI) 26.0-26.9, adult: Secondary | ICD-10-CM | POA: Diagnosis not present

## 2018-07-30 DIAGNOSIS — Z299 Encounter for prophylactic measures, unspecified: Secondary | ICD-10-CM | POA: Diagnosis not present

## 2018-07-30 DIAGNOSIS — R5383 Other fatigue: Secondary | ICD-10-CM | POA: Diagnosis not present

## 2018-08-05 DIAGNOSIS — M159 Polyosteoarthritis, unspecified: Secondary | ICD-10-CM | POA: Diagnosis not present

## 2018-08-05 DIAGNOSIS — I10 Essential (primary) hypertension: Secondary | ICD-10-CM | POA: Diagnosis not present

## 2018-08-12 ENCOUNTER — Telehealth: Payer: Self-pay | Admitting: *Deleted

## 2018-08-12 ENCOUNTER — Inpatient Hospital Stay: Payer: Medicare Other | Attending: Oncology

## 2018-08-12 DIAGNOSIS — C9111 Chronic lymphocytic leukemia of B-cell type in remission: Secondary | ICD-10-CM | POA: Diagnosis not present

## 2018-08-12 DIAGNOSIS — D599 Acquired hemolytic anemia, unspecified: Secondary | ICD-10-CM

## 2018-08-12 DIAGNOSIS — D591 Other autoimmune hemolytic anemias: Secondary | ICD-10-CM | POA: Diagnosis not present

## 2018-08-12 DIAGNOSIS — C9110 Chronic lymphocytic leukemia of B-cell type not having achieved remission: Secondary | ICD-10-CM

## 2018-08-12 LAB — CMP (CANCER CENTER ONLY)
ALBUMIN: 3.9 g/dL (ref 3.5–5.0)
ALK PHOS: 80 U/L (ref 38–126)
ALT: 12 U/L (ref 0–44)
ANION GAP: 10 (ref 5–15)
AST: 15 U/L (ref 15–41)
BILIRUBIN TOTAL: 1.2 mg/dL (ref 0.3–1.2)
BUN: 21 mg/dL (ref 8–23)
CALCIUM: 9.4 mg/dL (ref 8.9–10.3)
CO2: 21 mmol/L — ABNORMAL LOW (ref 22–32)
Chloride: 107 mmol/L (ref 98–111)
Creatinine: 1.1 mg/dL (ref 0.61–1.24)
GFR, Estimated: >60 mL/min (ref >60)
GLUCOSE: 209 mg/dL — AB (ref 70–99)
Potassium: 4.1 mmol/L (ref 3.5–5.1)
Sodium: 138 mmol/L (ref 135–145)
TOTAL PROTEIN: 7.6 g/dL (ref 6.5–8.1)

## 2018-08-12 LAB — CBC WITH DIFFERENTIAL (CANCER CENTER ONLY)
BASOS ABS: 0 10*3/uL (ref 0.0–0.1)
Basophils Relative: 0 %
EOS PCT: 3 %
Eosinophils Absolute: 0.4 10*3/uL (ref 0.0–0.5)
HCT: 38.9 % (ref 38.4–49.9)
Hemoglobin: 13.5 g/dL (ref 13.0–17.1)
Lymphocytes Relative: 43 %
Lymphs Abs: 5.7 10*3/uL — ABNORMAL HIGH (ref 0.9–3.3)
MCH: 31.3 pg (ref 27.2–33.4)
MCHC: 34.7 g/dL (ref 32.0–36.0)
MCV: 90.3 fL (ref 79.3–98.0)
MONO ABS: 2 10*3/uL — AB (ref 0.1–0.9)
MONOS PCT: 15 %
Neutro Abs: 5.1 10*3/uL (ref 1.5–6.5)
Neutrophils Relative %: 39 %
PLATELETS: 154 10*3/uL (ref 140–400)
RBC: 4.31 MIL/uL (ref 4.20–5.82)
RDW: 14.8 % — AB (ref 11.0–14.6)
WBC: 13.2 10*3/uL — AB (ref 4.0–10.3)

## 2018-08-12 LAB — DIRECT ANTIGLOBULIN TEST (NOT AT ARMC)
DAT, COMPLEMENT: POSITIVE
DAT, IgG: POSITIVE

## 2018-08-12 LAB — SAMPLE TO BLOOD BANK

## 2018-08-12 LAB — RETICULOCYTES
RBC.: 4.31 MIL/uL (ref 4.20–5.82)
RETIC COUNT ABSOLUTE: 232.7 10*3/uL — AB (ref 34.8–93.9)
RETIC CT PCT: 5.4 % — AB (ref 0.8–1.8)

## 2018-08-12 LAB — LACTATE DEHYDROGENASE: LDH: 303 U/L — ABNORMAL HIGH (ref 98–192)

## 2018-08-12 NOTE — Telephone Encounter (Signed)
Spoke with wife. Per dr Alen Blew, hgb normal.

## 2018-08-12 NOTE — Telephone Encounter (Signed)
-----   Message from Wible Portela, MD sent at 08/12/2018  8:43 AM EDT ----- Please let him know his hgb is normal. Nothing is needed.

## 2018-08-24 DIAGNOSIS — Z6825 Body mass index (BMI) 25.0-25.9, adult: Secondary | ICD-10-CM | POA: Diagnosis not present

## 2018-08-24 DIAGNOSIS — K5792 Diverticulitis of intestine, part unspecified, without perforation or abscess without bleeding: Secondary | ICD-10-CM | POA: Diagnosis not present

## 2018-08-24 DIAGNOSIS — Z299 Encounter for prophylactic measures, unspecified: Secondary | ICD-10-CM | POA: Diagnosis not present

## 2018-08-24 DIAGNOSIS — I10 Essential (primary) hypertension: Secondary | ICD-10-CM | POA: Diagnosis not present

## 2018-08-26 ENCOUNTER — Inpatient Hospital Stay: Payer: Medicare Other

## 2018-08-26 DIAGNOSIS — D591 Other autoimmune hemolytic anemias: Secondary | ICD-10-CM | POA: Diagnosis not present

## 2018-08-26 DIAGNOSIS — D599 Acquired hemolytic anemia, unspecified: Secondary | ICD-10-CM

## 2018-08-26 DIAGNOSIS — C9111 Chronic lymphocytic leukemia of B-cell type in remission: Secondary | ICD-10-CM | POA: Diagnosis not present

## 2018-08-26 LAB — CBC WITH DIFFERENTIAL (CANCER CENTER ONLY)
Basophils Absolute: 0 10*3/uL (ref 0.0–0.1)
Basophils Relative: 0 %
Eosinophils Absolute: 0.3 10*3/uL (ref 0.0–0.5)
Eosinophils Relative: 2 %
HEMATOCRIT: 40.5 % (ref 38.4–49.9)
Hemoglobin: 13.8 g/dL (ref 13.0–17.1)
Lymphocytes Relative: 50 %
Lymphs Abs: 5.4 10*3/uL — ABNORMAL HIGH (ref 0.9–3.3)
MCH: 30.2 pg (ref 27.2–33.4)
MCHC: 34.1 g/dL (ref 32.0–36.0)
MCV: 88.6 fL (ref 79.3–98.0)
MONO ABS: 1.1 10*3/uL — AB (ref 0.1–0.9)
Monocytes Relative: 10 %
NEUTROS ABS: 4.1 10*3/uL (ref 1.5–6.5)
Neutrophils Relative %: 38 %
Platelet Count: 139 10*3/uL — ABNORMAL LOW (ref 140–400)
RBC: 4.57 MIL/uL (ref 4.20–5.82)
RDW: 15.7 % — AB (ref 11.0–14.6)
WBC Count: 10.9 10*3/uL — ABNORMAL HIGH (ref 4.0–10.3)

## 2018-08-26 LAB — SAMPLE TO BLOOD BANK

## 2018-09-09 ENCOUNTER — Inpatient Hospital Stay: Payer: Medicare Other | Attending: Oncology

## 2018-09-09 ENCOUNTER — Telehealth: Payer: Self-pay | Admitting: *Deleted

## 2018-09-09 DIAGNOSIS — Z92241 Personal history of systemic steroid therapy: Secondary | ICD-10-CM | POA: Diagnosis not present

## 2018-09-09 DIAGNOSIS — D599 Acquired hemolytic anemia, unspecified: Secondary | ICD-10-CM

## 2018-09-09 DIAGNOSIS — D696 Thrombocytopenia, unspecified: Secondary | ICD-10-CM | POA: Diagnosis not present

## 2018-09-09 DIAGNOSIS — Z9221 Personal history of antineoplastic chemotherapy: Secondary | ICD-10-CM | POA: Diagnosis not present

## 2018-09-09 DIAGNOSIS — D591 Other autoimmune hemolytic anemias: Secondary | ICD-10-CM | POA: Insufficient documentation

## 2018-09-09 DIAGNOSIS — C9111 Chronic lymphocytic leukemia of B-cell type in remission: Secondary | ICD-10-CM | POA: Diagnosis not present

## 2018-09-09 LAB — SAMPLE TO BLOOD BANK

## 2018-09-09 LAB — CBC WITH DIFFERENTIAL (CANCER CENTER ONLY)
ABS IMMATURE GRANULOCYTES: 0.05 10*3/uL (ref 0.00–0.07)
BASOS ABS: 0.1 10*3/uL (ref 0.0–0.1)
BASOS PCT: 0 %
EOS ABS: 0.3 10*3/uL (ref 0.0–0.5)
Eosinophils Relative: 2 %
HCT: 41.8 % (ref 39.0–52.0)
HEMOGLOBIN: 14.1 g/dL (ref 13.0–17.0)
Immature Granulocytes: 0 %
Lymphocytes Relative: 52 %
Lymphs Abs: 5.8 10*3/uL — ABNORMAL HIGH (ref 0.7–4.0)
MCH: 30.7 pg (ref 26.0–34.0)
MCHC: 33.7 g/dL (ref 30.0–36.0)
MCV: 90.9 fL (ref 80.0–100.0)
Monocytes Absolute: 1.5 10*3/uL — ABNORMAL HIGH (ref 0.1–1.0)
Monocytes Relative: 13 %
NEUTROS ABS: 3.7 10*3/uL (ref 1.7–7.7)
NEUTROS PCT: 33 %
NRBC: 0 % (ref 0.0–0.2)
PLATELETS: 133 10*3/uL — AB (ref 150–400)
RBC: 4.6 MIL/uL (ref 4.22–5.81)
RDW: 17.2 % — AB (ref 11.5–15.5)
WBC Count: 11.4 10*3/uL — ABNORMAL HIGH (ref 4.0–10.5)

## 2018-09-09 NOTE — Telephone Encounter (Signed)
Spoke with wife Juliann Pulse, per dr Alen Blew, hemoglobin  normal

## 2018-09-09 NOTE — Telephone Encounter (Signed)
-----   Message from Derk Portela, MD sent at 09/09/2018  8:19 AM EDT ----- Please let him know his Hgb is normal.

## 2018-09-21 DIAGNOSIS — I10 Essential (primary) hypertension: Secondary | ICD-10-CM | POA: Diagnosis not present

## 2018-09-21 DIAGNOSIS — E1165 Type 2 diabetes mellitus with hyperglycemia: Secondary | ICD-10-CM | POA: Diagnosis not present

## 2018-09-21 DIAGNOSIS — N400 Enlarged prostate without lower urinary tract symptoms: Secondary | ICD-10-CM | POA: Diagnosis not present

## 2018-09-21 DIAGNOSIS — Z299 Encounter for prophylactic measures, unspecified: Secondary | ICD-10-CM | POA: Diagnosis not present

## 2018-09-21 DIAGNOSIS — Z6825 Body mass index (BMI) 25.0-25.9, adult: Secondary | ICD-10-CM | POA: Diagnosis not present

## 2018-09-23 ENCOUNTER — Inpatient Hospital Stay: Payer: Medicare Other

## 2018-09-23 ENCOUNTER — Inpatient Hospital Stay (HOSPITAL_BASED_OUTPATIENT_CLINIC_OR_DEPARTMENT_OTHER): Payer: Medicare Other | Admitting: Oncology

## 2018-09-23 ENCOUNTER — Telehealth: Payer: Self-pay

## 2018-09-23 VITALS — BP 134/62 | HR 55 | Temp 98.1°F | Resp 17 | Ht 72.0 in | Wt 185.7 lb

## 2018-09-23 DIAGNOSIS — Z9221 Personal history of antineoplastic chemotherapy: Secondary | ICD-10-CM | POA: Diagnosis not present

## 2018-09-23 DIAGNOSIS — D696 Thrombocytopenia, unspecified: Secondary | ICD-10-CM

## 2018-09-23 DIAGNOSIS — D591 Other autoimmune hemolytic anemias: Secondary | ICD-10-CM | POA: Diagnosis not present

## 2018-09-23 DIAGNOSIS — C9111 Chronic lymphocytic leukemia of B-cell type in remission: Secondary | ICD-10-CM

## 2018-09-23 DIAGNOSIS — D599 Acquired hemolytic anemia, unspecified: Secondary | ICD-10-CM

## 2018-09-23 DIAGNOSIS — C9110 Chronic lymphocytic leukemia of B-cell type not having achieved remission: Secondary | ICD-10-CM

## 2018-09-23 DIAGNOSIS — Z92241 Personal history of systemic steroid therapy: Secondary | ICD-10-CM

## 2018-09-23 LAB — CBC WITH DIFFERENTIAL (CANCER CENTER ONLY)
Abs Immature Granulocytes: 0.05 10*3/uL (ref 0.00–0.07)
Basophils Absolute: 0 10*3/uL (ref 0.0–0.1)
Basophils Relative: 0 %
Eosinophils Absolute: 0.3 10*3/uL (ref 0.0–0.5)
Eosinophils Relative: 2 %
HCT: 42.2 % (ref 39.0–52.0)
Hemoglobin: 14.5 g/dL (ref 13.0–17.0)
Immature Granulocytes: 0 %
LYMPHS ABS: 6.2 10*3/uL — AB (ref 0.7–4.0)
LYMPHS PCT: 54 %
MCH: 30.3 pg (ref 26.0–34.0)
MCHC: 34.4 g/dL (ref 30.0–36.0)
MCV: 88.3 fL (ref 80.0–100.0)
MONO ABS: 1.6 10*3/uL — AB (ref 0.1–1.0)
MONOS PCT: 13 %
Neutro Abs: 3.7 10*3/uL (ref 1.7–7.7)
Neutrophils Relative %: 31 %
PLATELETS: 127 10*3/uL — AB (ref 150–400)
RBC: 4.78 MIL/uL (ref 4.22–5.81)
RDW: 15.9 % — AB (ref 11.5–15.5)
WBC Count: 11.8 10*3/uL — ABNORMAL HIGH (ref 4.0–10.5)
nRBC: 0 % (ref 0.0–0.2)

## 2018-09-23 LAB — SAMPLE TO BLOOD BANK

## 2018-09-23 NOTE — Telephone Encounter (Signed)
Printed avs and calender of upcoming appointment. Per 10/23 los

## 2018-09-23 NOTE — Progress Notes (Signed)
Hematology and Oncology Follow Up Visit  Richard Evans [id number] Jul 09, 1946 72 y.o. 09/23/2018 8:15 AM [name], [name], MDVyas, Dhruv B, MD   Principle Diagnosis: 72 year old man with:  1. CLL diagnosed in 2017 after presenting with lymphocytosis and hemolytic anemia.  He is asymptomatic and has not required any treatment at this time.  2. Autoimmune hemolytic anemia currently in remission.  He was diagnosed in 2017 and required steroid and rituximab treatment.    Prior Therapy:    Prednisone 70 mg daily started on 06/30/2016. He achieved complete response and prednisone tapered completely off on 09/11/2016. He relapsed in October 2017.  Prednisone 90 mg daily started on 09/30/2016. He achieved complete response on 10/17/2016. He completed prednisone taper on 12/18/2016.  Rituximab at 375 mg/m weekly started on 08/08/2017. He is status post 4 weeks of therapy completed on 08/27/2017.  Prednisone 60 mg daily started on 09/17/2017.  He was tapered off in December 2018 after achieving a complete response.  Current therapy: Active surveillance.    Interim History:  Richard Evans returns today for a repeat evaluation.  Since the last visit, he reports no major changes in his health.  He remains active and continues to attend activities of daily living.  His appetite has been excellent and his performance status is unchanged.  He denies any dyspnea on exertion excessive fatigue.  He denies any easy bruising or bleeding.  He denies any constitutional symptoms or night sweats.  His quality of life is unchanged.  He does not report any headaches, blurry vision, syncope or seizures.  He denies any alteration in mental status or confusion.  He is not report any fevers or chills or weight loss.  He denies any chest pain or dyspnea on exertion.   He does not report any palpitation, orthopnea or leg edema. He does not report any cough, wheezing or hemoptysis. He does not report any nausea, vomiting.  He  denies any constipation or diarrhea.  He does not report any frequency urgency or hesitancy. He has not reported any arthralgias or myalgias.   He denies any skin rashes or lesions.  He denies any bleeding or clotting tendency.  He denies any changes in his mood.  Remaining review of systems is negative.    Medications: I have reviewed the patient's current medications.  Current Outpatient Medications  Medication Sig Dispense Refill  . atenolol (TENORMIN) 25 MG tablet Take 25 mg by mouth at bedtime.     . Cholecalciferol (VITAMIN D) 2000 units CAPS Take 1 capsule by mouth daily.    . diphenhydrAMINE (BENADRYL) 25 mg capsule Take 25 mg by mouth every 6 (six) hours as needed.    . folic acid (FOLVITE) 1 MG tablet TAKE ONE TABLET BY MOUTH EVERY DAY 30 tablet 2  . polyethylene glycol powder (CLEARLAX) powder Take 17 g by mouth daily as needed (constipation).    . Tamsulosin HCl (FLOMAX) 0.4 MG CAPS Take 0.4 mg by mouth at bedtime.      No current facility-administered medications for this visit.      Allergies:  Allergies  Allergen Reactions  . Adhesive [Tape] Itching and Rash    Paper tape or wrapped bandage is preferred    Past Medical History, Surgical history, Social history, and Family History discussed today and unchanged from previous examination.  Physical Exam: Blood pressure 134/62, pulse (!) 55, temperature 98.1 F (36.7 C), temperature source Oral, resp. rate 17, height 6' (1.829 m), weight 185 lb 11.2  oz (84.2 kg), SpO2 93 %.     ECOG: 0   General appearance: Alert, awake without any distress. Head: Atraumatic without abnormalities Oropharynx: Without any thrush or ulcers. Eyes: No scleral icterus. Lymph nodes: No lymphadenopathy noted in the cervical, supraclavicular, or axillary nodes Heart:regular rate and rhythm, without any murmurs or gallops.   Lung: Clear to auscultation without any rhonchi, wheezes or dullness to percussion. Abdomin: Soft, nontender without  any shifting dullness or ascites. Musculoskeletal: No clubbing or cyanosis. Neurological: No motor or sensory deficits. Skin: No rashes or lesions.     Lab Results: Lab Results  Component Value Date   WBC 11.4 (H) 09/09/2018   HGB 14.1 09/09/2018   HCT 41.8 09/09/2018   MCV 90.9 09/09/2018   PLT 133 (L) 09/09/2018     Chemistry      Component Value Date/Time   NA 138 08/12/2018 0804   NA 141 12/03/2017 0741   K 4.1 08/12/2018 0804   K 4.1 12/03/2017 0741   CL 107 08/12/2018 0804   CO2 21 (L) 08/12/2018 0804   CO2 22 12/03/2017 0741   BUN 21 08/12/2018 0804   BUN 22.1 12/03/2017 0741   CREATININE 1.10 08/12/2018 0804   CREATININE 1.1 12/03/2017 0741      Component Value Date/Time   CALCIUM 9.4 08/12/2018 0804   CALCIUM 9.3 12/03/2017 0741   ALKPHOS 80 08/12/2018 0804   ALKPHOS 66 12/03/2017 0741   AST 15 08/12/2018 0804   AST 16 12/03/2017 0741   ALT 12 08/12/2018 0804   ALT 22 12/03/2017 0741   BILITOT 1.2 08/12/2018 0804   BILITOT 1.01 12/03/2017 0741      G ROSS AND MICROSCOPIC INFORMATION  Impression and Plan:   72 year old man with:   1.  CLL presented with a lymphocytosis and splenomegaly.  Diagnosis confirmed by bone marrow biopsy in 2017.   The natural course of this disease was reviewed again and treatment indications were discussed.  These were indication would include painful adenopathy, constitutional symptoms, recurrent infections among others.  At this time he remains asymptomatic and I recommended continued observation and surveillance.  2. Autoimmune hemolytic anemia: He remains in remission at this time.  His hemoglobin continues to be normal without any evidence of relapse.   3. Thrombocytopenia: Autoimmune related with platelet count appears overall adequate.  No need for any treatment or intervention at this time.  4. Follow-up: Will be 9 weeks for an MD evaluation.  He will have a 3-week and 6-week laboratory testing.  15 minutes  was spent with the patient today face-to-face.  More than 50% of the time was spent on discussing his disease status, laboratory data and coordinating plan of care.  Zola Button, MD 10/23/20198:15 AM

## 2018-10-07 ENCOUNTER — Other Ambulatory Visit: Payer: Medicare Other

## 2018-10-14 ENCOUNTER — Other Ambulatory Visit: Payer: Medicare Other

## 2018-10-14 ENCOUNTER — Telehealth: Payer: Self-pay | Admitting: Oncology

## 2018-10-14 NOTE — Telephone Encounter (Signed)
Patient would like to skip November and just come back in december

## 2018-10-23 DIAGNOSIS — Z299 Encounter for prophylactic measures, unspecified: Secondary | ICD-10-CM | POA: Diagnosis not present

## 2018-10-23 DIAGNOSIS — Z6825 Body mass index (BMI) 25.0-25.9, adult: Secondary | ICD-10-CM | POA: Diagnosis not present

## 2018-10-23 DIAGNOSIS — I10 Essential (primary) hypertension: Secondary | ICD-10-CM | POA: Diagnosis not present

## 2018-10-23 DIAGNOSIS — K5792 Diverticulitis of intestine, part unspecified, without perforation or abscess without bleeding: Secondary | ICD-10-CM | POA: Diagnosis not present

## 2018-11-04 ENCOUNTER — Telehealth: Payer: Self-pay | Admitting: *Deleted

## 2018-11-04 ENCOUNTER — Inpatient Hospital Stay: Payer: Medicare Other | Attending: Oncology

## 2018-11-04 DIAGNOSIS — D591 Other autoimmune hemolytic anemias: Secondary | ICD-10-CM | POA: Diagnosis not present

## 2018-11-04 DIAGNOSIS — Z9221 Personal history of antineoplastic chemotherapy: Secondary | ICD-10-CM | POA: Insufficient documentation

## 2018-11-04 DIAGNOSIS — C9110 Chronic lymphocytic leukemia of B-cell type not having achieved remission: Secondary | ICD-10-CM | POA: Diagnosis not present

## 2018-11-04 DIAGNOSIS — D599 Acquired hemolytic anemia, unspecified: Secondary | ICD-10-CM

## 2018-11-04 DIAGNOSIS — Z79899 Other long term (current) drug therapy: Secondary | ICD-10-CM | POA: Insufficient documentation

## 2018-11-04 DIAGNOSIS — C9111 Chronic lymphocytic leukemia of B-cell type in remission: Secondary | ICD-10-CM | POA: Diagnosis present

## 2018-11-04 LAB — CBC WITH DIFFERENTIAL (CANCER CENTER ONLY)
ABS IMMATURE GRANULOCYTES: 0.1 10*3/uL — AB (ref 0.00–0.07)
BASOS PCT: 1 %
Basophils Absolute: 0.1 10*3/uL (ref 0.0–0.1)
Eosinophils Absolute: 0.3 10*3/uL (ref 0.0–0.5)
Eosinophils Relative: 2 %
HEMATOCRIT: 39.9 % (ref 39.0–52.0)
HEMOGLOBIN: 14.2 g/dL (ref 13.0–17.0)
IMMATURE GRANULOCYTES: 1 %
LYMPHS ABS: 6.3 10*3/uL — AB (ref 0.7–4.0)
LYMPHS PCT: 48 %
MCH: 31.3 pg (ref 26.0–34.0)
MCHC: 35.6 g/dL (ref 30.0–36.0)
MCV: 87.9 fL (ref 80.0–100.0)
MONO ABS: 1.6 10*3/uL — AB (ref 0.1–1.0)
MONOS PCT: 12 %
Neutro Abs: 4.8 10*3/uL (ref 1.7–7.7)
Neutrophils Relative %: 36 %
Platelet Count: 146 10*3/uL — ABNORMAL LOW (ref 150–400)
RBC: 4.54 MIL/uL (ref 4.22–5.81)
RDW: 15 % (ref 11.5–15.5)
WBC Count: 13.1 10*3/uL — ABNORMAL HIGH (ref 4.0–10.5)
nRBC: 0 % (ref 0.0–0.2)

## 2018-11-04 LAB — SAMPLE TO BLOOD BANK

## 2018-11-04 NOTE — Telephone Encounter (Signed)
-----   Message from Boyar Portela, MD sent at 11/04/2018  8:56 AM EST ----- Please let him know his Hgb is normal.

## 2018-11-04 NOTE — Telephone Encounter (Signed)
Per dr Alen Blew, Spoke with patient, gave results of last HGB.

## 2018-11-25 DIAGNOSIS — Z79899 Other long term (current) drug therapy: Secondary | ICD-10-CM | POA: Diagnosis not present

## 2018-11-25 DIAGNOSIS — E1165 Type 2 diabetes mellitus with hyperglycemia: Secondary | ICD-10-CM | POA: Diagnosis not present

## 2018-11-25 DIAGNOSIS — K8051 Calculus of bile duct without cholangitis or cholecystitis with obstruction: Secondary | ICD-10-CM | POA: Diagnosis not present

## 2018-11-25 DIAGNOSIS — K8080 Other cholelithiasis without obstruction: Secondary | ICD-10-CM | POA: Diagnosis not present

## 2018-11-25 DIAGNOSIS — R911 Solitary pulmonary nodule: Secondary | ICD-10-CM | POA: Diagnosis not present

## 2018-11-25 DIAGNOSIS — K8020 Calculus of gallbladder without cholecystitis without obstruction: Secondary | ICD-10-CM | POA: Diagnosis not present

## 2018-11-25 DIAGNOSIS — K819 Cholecystitis, unspecified: Secondary | ICD-10-CM | POA: Diagnosis not present

## 2018-11-26 ENCOUNTER — Telehealth: Payer: Self-pay

## 2018-11-26 ENCOUNTER — Inpatient Hospital Stay (HOSPITAL_BASED_OUTPATIENT_CLINIC_OR_DEPARTMENT_OTHER): Payer: Medicare Other | Admitting: Oncology

## 2018-11-26 ENCOUNTER — Inpatient Hospital Stay: Payer: Medicare Other

## 2018-11-26 VITALS — BP 162/89 | HR 70 | Temp 97.5°F | Resp 18 | Ht 72.0 in | Wt 188.7 lb

## 2018-11-26 DIAGNOSIS — D591 Other autoimmune hemolytic anemias: Secondary | ICD-10-CM | POA: Diagnosis not present

## 2018-11-26 DIAGNOSIS — Z79899 Other long term (current) drug therapy: Secondary | ICD-10-CM

## 2018-11-26 DIAGNOSIS — Z9221 Personal history of antineoplastic chemotherapy: Secondary | ICD-10-CM | POA: Diagnosis not present

## 2018-11-26 DIAGNOSIS — D599 Acquired hemolytic anemia, unspecified: Secondary | ICD-10-CM

## 2018-11-26 DIAGNOSIS — C9110 Chronic lymphocytic leukemia of B-cell type not having achieved remission: Secondary | ICD-10-CM | POA: Diagnosis not present

## 2018-11-26 LAB — SAMPLE TO BLOOD BANK

## 2018-11-26 LAB — CBC WITH DIFFERENTIAL (CANCER CENTER ONLY)
ABS IMMATURE GRANULOCYTES: 0.05 10*3/uL (ref 0.00–0.07)
Basophils Absolute: 0 10*3/uL (ref 0.0–0.1)
Basophils Relative: 0 %
Eosinophils Absolute: 0.2 10*3/uL (ref 0.0–0.5)
Eosinophils Relative: 2 %
HEMATOCRIT: 40.3 % (ref 39.0–52.0)
Hemoglobin: 14.3 g/dL (ref 13.0–17.0)
Immature Granulocytes: 0 %
LYMPHS PCT: 42 %
Lymphs Abs: 4.8 10*3/uL — ABNORMAL HIGH (ref 0.7–4.0)
MCH: 32.8 pg (ref 26.0–34.0)
MCHC: 35.5 g/dL (ref 30.0–36.0)
MCV: 92.4 fL (ref 80.0–100.0)
MONO ABS: 1.4 10*3/uL — AB (ref 0.1–1.0)
Monocytes Relative: 12 %
NEUTROS ABS: 5 10*3/uL (ref 1.7–7.7)
Neutrophils Relative %: 44 %
Platelet Count: 112 10*3/uL — ABNORMAL LOW (ref 150–400)
RBC: 4.36 MIL/uL (ref 4.22–5.81)
RDW: 14.3 % (ref 11.5–15.5)
WBC Count: 11.5 10*3/uL — ABNORMAL HIGH (ref 4.0–10.5)
nRBC: 0 % (ref 0.0–0.2)

## 2018-11-26 NOTE — Progress Notes (Signed)
Hematology and Oncology Follow Up Visit  Richard Evans [id number] [birth year] 72 y.o. 11/26/2018 8:15 AM [name], MDVyas, Dhruv B, MD   Principle Diagnosis: 72 year old man with:  1.  Stage I CLL presented with lymphocytosis and lymphadenopathy diagnosed in 2017.  Bone marrow biopsy confirmed the presence of this disease.  2. Autoimmune hemolytic anemia in 2017 and is not require treatment at this time.      Prior Therapy:    Prednisone 70 mg daily started on 06/30/2016. He achieved complete response and prednisone tapered completely off on 09/11/2016. He relapsed in October 2017.  Prednisone 90 mg daily started on 09/30/2016. He achieved complete response on 10/17/2016. He completed prednisone taper on 12/18/2016.  Rituximab at 375 mg/m weekly started on 08/08/2017. He is status post 4 weeks of therapy completed on 08/27/2017.  Prednisone 60 mg daily started on 09/17/2017.  He was tapered off in December 2018 after achieving a complete response.  Current therapy: Active surveillance.    Interim History:  Richard Evans presents today for a follow-up.  Since the last visit he reports feeling reasonably fair.  He developed epigastric and right upper quadrant abdominal pain on 11/24/2018 and was seen at [facility] for an evaluation.  His pain was attributed to gallbladder stones and possible cholecystitis and cholecystectomy was recommended.  His pain has subsided at that time he is no longer having any discomfort.  He denies any nausea or vomiting or changes in his bowels.  He denies any hematochezia or melena.  He does not report any headaches, blurry vision, syncope or seizures.  He denies any dizziness or lethargy.  He is not report any fevers or chills or weight loss.  He denies any chest pain or shortness of breath.   He does not report any palpitation, orthopnea or leg edema. He does not report any cough, wheezing or hemoptysis. He does not report any nausea, vomiting or  abdominal distention.  He denies any changes in bowel habits.  He does not report any frequency urgency or hesitancy. He has not reported any bone pain or pathological fractures.  He denies any skin rashes or lesions.  He denies any ecchymosis or petechiae.Marland Kitchen  He denies any anxiety or depression.  Remaining review of systems is negative.    Medications: I have reviewed the patient's current medications.  Current Outpatient Medications  Medication Sig Dispense Refill  . atenolol (TENORMIN) 25 MG tablet Take 25 mg by mouth at bedtime.     . Cholecalciferol (VITAMIN D) 2000 units CAPS Take 1 capsule by mouth daily.    . diphenhydrAMINE (BENADRYL) 25 mg capsule Take 25 mg by mouth every 6 (six) hours as needed.    . folic acid (FOLVITE) 1 MG tablet TAKE ONE TABLET BY MOUTH EVERY DAY 30 tablet 2  . polyethylene glycol powder (CLEARLAX) powder Take 17 g by mouth daily as needed (constipation).    . Tamsulosin HCl (FLOMAX) 0.4 MG CAPS Take 0.4 mg by mouth at bedtime.      No current facility-administered medications for this visit.      Allergies:  Allergies  Allergen Reactions  . Adhesive [Tape] Itching and Rash    Paper tape or wrapped bandage is preferred    Past Medical History, Surgical history, Social history, and Family History discussed today and unchanged from previous examination.  Physical Exam:  Blood pressure (!) 162/89, pulse 70, temperature (!) 97.5 F (36.4 C), temperature source Oral, resp. rate 18, height 6' (  1.829 m), weight 188 lb 11.2 oz (85.6 kg), SpO2 95 %.     ECOG: 0   General appearance: Comfortable appearing without any discomfort Head: Normocephalic without any trauma Oropharynx: Mucous membranes are moist and pink without any thrush or ulcers. Eyes: Pupils are equal and round reactive to light. Lymph nodes: No cervical, supraclavicular, inguinal or axillary lymphadenopathy.   Heart:regular rate and rhythm.  S1 and S2 without leg edema. Lung: Clear  without any rhonchi or wheezes.  No dullness to percussion. Abdomin: Soft, nontender, nondistended with good bowel sounds.  No hepatosplenomegaly. Musculoskeletal: No joint deformity or effusion.  Full range of motion noted. Neurological: No deficits noted on motor, sensory and deep tendon reflex exam. Skin: No petechial rash or dryness.  Appeared moist.      Lab Results: Lab Results  Component Value Date   WBC 13.1 (H) 11/04/2018   HGB 14.2 11/04/2018   HCT 39.9 11/04/2018   MCV 87.9 11/04/2018   PLT 146 (L) 11/04/2018     Chemistry      Component Value Date/Time   NA 138 08/12/2018 0804   NA 141 12/03/2017 0741   K 4.1 08/12/2018 0804   K 4.1 12/03/2017 0741   CL 107 08/12/2018 0804   CO2 21 (L) 08/12/2018 0804   CO2 22 12/03/2017 0741   BUN 21 08/12/2018 0804   BUN 22.1 12/03/2017 0741   CREATININE 1.10 08/12/2018 0804   CREATININE 1.1 12/03/2017 0741      Component Value Date/Time   CALCIUM 9.4 08/12/2018 0804   CALCIUM 9.3 12/03/2017 0741   ALKPHOS 80 08/12/2018 0804   ALKPHOS 66 12/03/2017 0741   AST 15 08/12/2018 0804   AST 16 12/03/2017 0741   ALT 12 08/12/2018 0804   ALT 22 12/03/2017 0741   BILITOT 1.2 08/12/2018 0804   BILITOT 1.01 12/03/2017 0741      G ROSS AND MICROSCOPIC INFORMATION  Impression and Plan:   72 year old man with:   1.  Stage I CLL diagnosed in 2017 with lymphocytosis and mild adenopathy.  He has been on active surveillance and has not required treatment since the time of diagnosis.  His lymphadenopathy is very mild and asymptomatic.  His labs from today were reviewed and showed little very change in his lymphocytosis.  Indication for treatment were reiterated and at this time he has none.  Continue observation and surveillance.  2. Autoimmune hemolytic anemia: He has not required treatment since his last steroid taper in October 2018.  Different treatment options were reviewed today which include splenectomy and retreatment  with steroids.  His hemoglobin continues to be within normal range and does not require treatment.   3. Thrombocytopenia: Remains mild and does not require any treatment at this time.  No active bleeding noted.  4.  Cholecystitis: I have no objections to undergoing cholecystectomy.  His labs are close to normal range and no contraindication from a hematological standpoint.  5. Follow-up: Will be every 3 weeks for laboratory evaluation.  25 minutes was spent with the patient today face-to-face.  More than 50% of the time was dedicated to reviewing laboratory data, indication for treatment and answering questions regarding potential surgery.   Zola Button, MD 12/26/20198:15 AM

## 2018-11-26 NOTE — Telephone Encounter (Signed)
Printed avs and calender of upcoming appointment. Per 12/23 los 

## 2018-12-16 ENCOUNTER — Telehealth: Payer: Self-pay | Admitting: *Deleted

## 2018-12-16 ENCOUNTER — Inpatient Hospital Stay: Payer: Medicare Other | Attending: Oncology

## 2018-12-16 ENCOUNTER — Telehealth: Payer: Self-pay

## 2018-12-16 DIAGNOSIS — D591 Other autoimmune hemolytic anemias: Secondary | ICD-10-CM | POA: Diagnosis not present

## 2018-12-16 DIAGNOSIS — D599 Acquired hemolytic anemia, unspecified: Secondary | ICD-10-CM

## 2018-12-16 DIAGNOSIS — C9111 Chronic lymphocytic leukemia of B-cell type in remission: Secondary | ICD-10-CM | POA: Insufficient documentation

## 2018-12-16 LAB — CBC WITH DIFFERENTIAL (CANCER CENTER ONLY)
Abs Immature Granulocytes: 0.05 10*3/uL (ref 0.00–0.07)
BASOS PCT: 0 %
Basophils Absolute: 0.1 10*3/uL (ref 0.0–0.1)
EOS ABS: 0.2 10*3/uL (ref 0.0–0.5)
Eosinophils Relative: 2 %
HCT: 39.2 % (ref 39.0–52.0)
HEMOGLOBIN: 14 g/dL (ref 13.0–17.0)
IMMATURE GRANULOCYTES: 0 %
LYMPHS ABS: 6 10*3/uL — AB (ref 0.7–4.0)
Lymphocytes Relative: 49 %
MCH: 31.7 pg (ref 26.0–34.0)
MCHC: 35.7 g/dL (ref 30.0–36.0)
MCV: 88.7 fL (ref 80.0–100.0)
Monocytes Absolute: 1.5 10*3/uL — ABNORMAL HIGH (ref 0.1–1.0)
Monocytes Relative: 12 %
NEUTROS ABS: 4.6 10*3/uL (ref 1.7–7.7)
NEUTROS PCT: 37 %
Platelet Count: 137 10*3/uL — ABNORMAL LOW (ref 150–400)
RBC: 4.42 MIL/uL (ref 4.22–5.81)
RDW: 14.2 % (ref 11.5–15.5)
WBC Count: 12.3 10*3/uL — ABNORMAL HIGH (ref 4.0–10.5)
nRBC: 0 % (ref 0.0–0.2)

## 2018-12-16 LAB — SAMPLE TO BLOOD BANK

## 2018-12-16 NOTE — Telephone Encounter (Signed)
-----   Message from Wight Portela, MD sent at 12/16/2018 10:21 AM EST ----- Please let him know Hgb is normal.

## 2018-12-16 NOTE — Telephone Encounter (Signed)
Spoke with wife gave results of last HGB

## 2018-12-16 NOTE — Telephone Encounter (Signed)
Per patient request to move all appointments back on Wednesday's. Per 1/15 walk in [employer]) and printed a calender

## 2018-12-17 ENCOUNTER — Other Ambulatory Visit: Payer: Medicare Other

## 2018-12-22 DIAGNOSIS — I10 Essential (primary) hypertension: Secondary | ICD-10-CM | POA: Diagnosis not present

## 2018-12-22 DIAGNOSIS — Z6825 Body mass index (BMI) 25.0-25.9, adult: Secondary | ICD-10-CM | POA: Diagnosis not present

## 2018-12-22 DIAGNOSIS — Z299 Encounter for prophylactic measures, unspecified: Secondary | ICD-10-CM | POA: Diagnosis not present

## 2018-12-22 DIAGNOSIS — C9110 Chronic lymphocytic leukemia of B-cell type not having achieved remission: Secondary | ICD-10-CM | POA: Diagnosis not present

## 2018-12-22 DIAGNOSIS — K5792 Diverticulitis of intestine, part unspecified, without perforation or abscess without bleeding: Secondary | ICD-10-CM | POA: Diagnosis not present

## 2018-12-25 DIAGNOSIS — Z6823 Body mass index (BMI) 23.0-23.9, adult: Secondary | ICD-10-CM | POA: Diagnosis not present

## 2018-12-25 DIAGNOSIS — Z1339 Encounter for screening examination for other mental health and behavioral disorders: Secondary | ICD-10-CM | POA: Diagnosis not present

## 2018-12-25 DIAGNOSIS — Z1331 Encounter for screening for depression: Secondary | ICD-10-CM | POA: Diagnosis not present

## 2018-12-25 DIAGNOSIS — I10 Essential (primary) hypertension: Secondary | ICD-10-CM | POA: Diagnosis not present

## 2018-12-25 DIAGNOSIS — R5383 Other fatigue: Secondary | ICD-10-CM | POA: Diagnosis not present

## 2018-12-25 DIAGNOSIS — E7800 Pure hypercholesterolemia, unspecified: Secondary | ICD-10-CM | POA: Diagnosis not present

## 2018-12-25 DIAGNOSIS — Z1211 Encounter for screening for malignant neoplasm of colon: Secondary | ICD-10-CM | POA: Diagnosis not present

## 2018-12-25 DIAGNOSIS — Z125 Encounter for screening for malignant neoplasm of prostate: Secondary | ICD-10-CM | POA: Diagnosis not present

## 2018-12-25 DIAGNOSIS — Z0000 Encounter for general adult medical examination without abnormal findings: Secondary | ICD-10-CM | POA: Diagnosis not present

## 2018-12-25 DIAGNOSIS — Z299 Encounter for prophylactic measures, unspecified: Secondary | ICD-10-CM | POA: Diagnosis not present

## 2018-12-25 DIAGNOSIS — C9110 Chronic lymphocytic leukemia of B-cell type not having achieved remission: Secondary | ICD-10-CM | POA: Diagnosis not present

## 2018-12-25 DIAGNOSIS — Z79899 Other long term (current) drug therapy: Secondary | ICD-10-CM | POA: Diagnosis not present

## 2018-12-25 DIAGNOSIS — Z7189 Other specified counseling: Secondary | ICD-10-CM | POA: Diagnosis not present

## 2019-01-06 ENCOUNTER — Inpatient Hospital Stay: Payer: Medicare Other | Attending: Oncology

## 2019-01-06 ENCOUNTER — Telehealth: Payer: Self-pay

## 2019-01-06 DIAGNOSIS — D696 Thrombocytopenia, unspecified: Secondary | ICD-10-CM | POA: Diagnosis not present

## 2019-01-06 DIAGNOSIS — C9111 Chronic lymphocytic leukemia of B-cell type in remission: Secondary | ICD-10-CM | POA: Diagnosis not present

## 2019-01-06 DIAGNOSIS — D591 Other autoimmune hemolytic anemias: Secondary | ICD-10-CM | POA: Diagnosis not present

## 2019-01-06 DIAGNOSIS — D599 Acquired hemolytic anemia, unspecified: Secondary | ICD-10-CM

## 2019-01-06 LAB — CBC WITH DIFFERENTIAL (CANCER CENTER ONLY)
Abs Immature Granulocytes: 0.07 10*3/uL (ref 0.00–0.07)
Basophils Absolute: 0 10*3/uL (ref 0.0–0.1)
Basophils Relative: 0 %
EOS ABS: 0.2 10*3/uL (ref 0.0–0.5)
Eosinophils Relative: 2 %
HCT: 38 % — ABNORMAL LOW (ref 39.0–52.0)
Hemoglobin: 13.5 g/dL (ref 13.0–17.0)
Immature Granulocytes: 1 %
Lymphocytes Relative: 51 %
Lymphs Abs: 6.4 10*3/uL — ABNORMAL HIGH (ref 0.7–4.0)
MCH: 32.1 pg (ref 26.0–34.0)
MCHC: 35.5 g/dL (ref 30.0–36.0)
MCV: 90.5 fL (ref 80.0–100.0)
MONO ABS: 1.3 10*3/uL — AB (ref 0.1–1.0)
Monocytes Relative: 11 %
Neutro Abs: 4.3 10*3/uL (ref 1.7–7.7)
Neutrophils Relative %: 35 %
Platelet Count: 127 10*3/uL — ABNORMAL LOW (ref 150–400)
RBC: 4.2 MIL/uL — ABNORMAL LOW (ref 4.22–5.81)
RDW: 14.6 % (ref 11.5–15.5)
WBC Count: 12.3 10*3/uL — ABNORMAL HIGH (ref 4.0–10.5)
nRBC: 0 % (ref 0.0–0.2)

## 2019-01-06 LAB — SAMPLE TO BLOOD BANK

## 2019-01-06 NOTE — Telephone Encounter (Signed)
Contacted patient and left message with results and to call back with any questions or concerns.

## 2019-01-06 NOTE — Telephone Encounter (Signed)
-----   Message from Schriner Portela, MD sent at 01/06/2019  8:34 AM EST ----- Please let him know his hgb is normal. No intervention is needed.

## 2019-01-07 ENCOUNTER — Other Ambulatory Visit: Payer: Medicare Other

## 2019-01-27 ENCOUNTER — Inpatient Hospital Stay: Payer: Medicare Other

## 2019-01-27 ENCOUNTER — Inpatient Hospital Stay (HOSPITAL_BASED_OUTPATIENT_CLINIC_OR_DEPARTMENT_OTHER): Payer: Medicare Other | Admitting: Oncology

## 2019-01-27 ENCOUNTER — Telehealth: Payer: Self-pay | Admitting: Oncology

## 2019-01-27 VITALS — BP 123/62 | HR 57 | Temp 98.2°F | Resp 17 | Ht 72.0 in | Wt 184.7 lb

## 2019-01-27 DIAGNOSIS — D591 Other autoimmune hemolytic anemias: Secondary | ICD-10-CM | POA: Diagnosis not present

## 2019-01-27 DIAGNOSIS — C9110 Chronic lymphocytic leukemia of B-cell type not having achieved remission: Secondary | ICD-10-CM | POA: Diagnosis not present

## 2019-01-27 DIAGNOSIS — D696 Thrombocytopenia, unspecified: Secondary | ICD-10-CM

## 2019-01-27 DIAGNOSIS — C9111 Chronic lymphocytic leukemia of B-cell type in remission: Secondary | ICD-10-CM | POA: Diagnosis not present

## 2019-01-27 DIAGNOSIS — D599 Acquired hemolytic anemia, unspecified: Secondary | ICD-10-CM

## 2019-01-27 LAB — CBC WITH DIFFERENTIAL (CANCER CENTER ONLY)
Abs Immature Granulocytes: 0.08 10*3/uL — ABNORMAL HIGH (ref 0.00–0.07)
Basophils Absolute: 0.1 10*3/uL (ref 0.0–0.1)
Basophils Relative: 0 %
Eosinophils Absolute: 0.2 10*3/uL (ref 0.0–0.5)
Eosinophils Relative: 2 %
HCT: 40.9 % (ref 39.0–52.0)
Hemoglobin: 14 g/dL (ref 13.0–17.0)
Immature Granulocytes: 1 %
Lymphocytes Relative: 54 %
Lymphs Abs: 7.1 10*3/uL — ABNORMAL HIGH (ref 0.7–4.0)
MCH: 31.2 pg (ref 26.0–34.0)
MCHC: 34.2 g/dL (ref 30.0–36.0)
MCV: 91.1 fL (ref 80.0–100.0)
MONOS PCT: 12 %
Monocytes Absolute: 1.5 10*3/uL — ABNORMAL HIGH (ref 0.1–1.0)
Neutro Abs: 4 10*3/uL (ref 1.7–7.7)
Neutrophils Relative %: 31 %
Platelet Count: 142 10*3/uL — ABNORMAL LOW (ref 150–400)
RBC: 4.49 MIL/uL (ref 4.22–5.81)
RDW: 14.6 % (ref 11.5–15.5)
WBC Count: 13 10*3/uL — ABNORMAL HIGH (ref 4.0–10.5)
nRBC: 0 % (ref 0.0–0.2)

## 2019-01-27 LAB — SAMPLE TO BLOOD BANK

## 2019-01-27 NOTE — Telephone Encounter (Signed)
Gave avs and calendar ° °

## 2019-01-27 NOTE — Progress Notes (Signed)
Hematology and Oncology Follow Up Visit  Richard Evans [id number] 1946/03/17 73 y.o. 01/27/2019 9:50 AM [name], [name], MDVyas, Dhruv B, MD   Principle Diagnosis: 73 year old man with:  1. CLL diagnosed in 2017.  He presented with lymphocytosis and lymphadenopathy with bone marrow involvement.    2. 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     Prior Therapy:    Prednisone 70 mg daily started on 06/30/2016. He achieved complete response and prednisone tapered completely off on 09/11/2016. He relapsed in October 2017.  Prednisone 90 mg daily started on 09/30/2016. He achieved complete response on 10/17/2016. He completed prednisone taper on 12/18/2016.  Rituximab at 375 mg/m weekly started on 08/08/2017. He is status post 4 weeks of therapy completed on 08/27/2017.  Prednisone 60 mg daily started on 09/17/2017.  He was tapered off in December 2018 after achieving a complete response.  Current therapy: Active surveillance.    Interim History:  Richard Evans is here for a follow-up visit.  Since last visit, he reports no major changes in his health.  He denies any recent hospitalization or illnesses.  He denies excessive fatigue or tiredness.  He denies any recurrent infections or lymphadenopathy.  His quality of life and performance status is unchanged.  He denies any recent hospitalizations or illnesses.  Patient denied any alteration mental status, neuropathy, confusion or dizziness.  Denies any headaches or lethargy.  Denies any night sweats, weight loss or changes in appetite.  Denied orthopnea, dyspnea on exertion or chest discomfort.  Denies shortness of breath, difficulty breathing hemoptysis or cough.  Denies any abdominal distention, nausea, early satiety or dyspepsia.  Denies any hematuria, frequency, dysuria or nocturia.  Denies any skin irritation, dryness or rash.  Denies any ecchymosis or petechiae.  Denies any lymphadenopathy or clotting.  Denies any heat or  cold intolerance.  Denies any anxiety or depression.  Remaining review of system is negative.        Medications: I have reviewed the patient's current medications.  Current Outpatient Medications  Medication Sig Dispense Refill  . atenolol (TENORMIN) 25 MG tablet Take 25 mg by mouth at bedtime.     . Cholecalciferol (VITAMIN D) 2000 units CAPS Take 1 capsule by mouth daily.    . diphenhydrAMINE (BENADRYL) 25 mg capsule Take 25 mg by mouth every 6 (six) hours as needed.    . folic acid (FOLVITE) 1 MG tablet TAKE ONE TABLET BY MOUTH EVERY DAY 30 tablet 2  . polyethylene glycol powder (CLEARLAX) powder Take 17 g by mouth daily as needed (constipation).    . Tamsulosin HCl (FLOMAX) 0.4 MG CAPS Take 0.4 mg by mouth at bedtime.      No current facility-administered medications for this visit.      Allergies:  Allergies  Allergen Reactions  . Adhesive [Tape] Itching and Rash    Paper tape or wrapped bandage is preferred    Past Medical History, Surgical history, Social history, and Family History discussed today and unchanged from previous examination.  Physical Exam:  Blood pressure 123/62, pulse (!) 57, temperature 98.2 F (36.8 C), temperature source Oral, resp. rate 17, height 6' (1.829 m), weight 184 lb 11.2 oz (83.8 kg), SpO2 93 %.     ECOG: 0   General appearance: Alert, awake without any distress. Head: Atraumatic without abnormalities Oropharynx: Without any thrush or ulcers. Eyes: No scleral icterus. Lymph nodes: No lymphadenopathy noted in the cervical, supraclavicular, or axillary nodes Heart:regular rate and rhythm,  without any murmurs or gallops.   Lung: Clear to auscultation without any rhonchi, wheezes or dullness to percussion. Abdomin: Soft, nontender without any shifting dullness or ascites. Musculoskeletal: No clubbing or cyanosis. Neurological: No motor or sensory deficits. Skin: No rashes or lesions.      Lab Results: Lab Results  Component  Value Date   WBC 12.3 (H) 01/06/2019   HGB 13.5 01/06/2019   HCT 38.0 (L) 01/06/2019   MCV 90.5 01/06/2019   PLT 127 (L) 01/06/2019     Chemistry      Component Value Date/Time   NA 138 08/12/2018 0804   NA 141 12/03/2017 0741   K 4.1 08/12/2018 0804   K 4.1 12/03/2017 0741   CL 107 08/12/2018 0804   CO2 21 (L) 08/12/2018 0804   CO2 22 12/03/2017 0741   BUN 21 08/12/2018 0804   BUN 22.1 12/03/2017 0741   CREATININE 1.10 08/12/2018 0804   CREATININE 1.1 12/03/2017 0741      Component Value Date/Time   CALCIUM 9.4 08/12/2018 0804   CALCIUM 9.3 12/03/2017 0741   ALKPHOS 80 08/12/2018 0804   ALKPHOS 66 12/03/2017 0741   AST 15 08/12/2018 0804   AST 16 12/03/2017 0741   ALT 12 08/12/2018 0804   ALT 22 12/03/2017 0741   BILITOT 1.2 08/12/2018 0804   BILITOT 1.01 12/03/2017 0741      G ROSS AND MICROSCOPIC INFORMATION  Impression and Plan:   73 year old man with:   1. CLL presented with a stage I disease occluding lymphocytosis and lymphadenopathy diagnosed in 2017.  He has received rituximab in the past but no other active treatment.  He is clinical status and physical exam today do not indicate progression of disease or indication for treatment.  Acacian for treatment were reiterated again including symptomatic lymphadenopathy, marrow failure or rapid rise in his white cell count.  Treatment options for CLL were reviewed which include chemotherapy, clinical antibodies and oral targeted therapy in the form of ibrutinib.  He is white cell count today continues to be close to normal range without any intervention is needed.  2. Autoimmune hemolytic anemia: He is status post therapy outlined above and currently on active surveillance.  Salvage therapy for hemolytic anemia include splenectomy among other options were also reiterated including immunosuppressive agents.  His hemoglobin continues to be normal and remains in remission.  He does not require any additional  treatment.   3. Thrombocytopenia: Has not required any treatment and remains overall mild.  Platelet count continues to be close to normal range today.  4. Follow-up: Will be every 4 weeks for laboratory testing and in 3 months for an MD evaluation.  25 minutes was spent with the patient today face-to-face.  More than 50% of the time was dedicated to spent on updating his disease status, reviewing laboratory data, differential diagnosis and management options.  Zola Button, MD 2/26/20209:50 AM

## 2019-01-28 ENCOUNTER — Other Ambulatory Visit: Payer: Medicare Other

## 2019-01-28 ENCOUNTER — Ambulatory Visit: Payer: Medicare Other | Admitting: Oncology

## 2019-02-24 ENCOUNTER — Inpatient Hospital Stay: Payer: Medicare Other

## 2019-03-24 ENCOUNTER — Other Ambulatory Visit: Payer: Medicare Other

## 2019-04-10 DIAGNOSIS — I10 Essential (primary) hypertension: Secondary | ICD-10-CM | POA: Diagnosis not present

## 2019-04-10 DIAGNOSIS — K219 Gastro-esophageal reflux disease without esophagitis: Secondary | ICD-10-CM | POA: Diagnosis not present

## 2019-04-10 DIAGNOSIS — I4891 Unspecified atrial fibrillation: Secondary | ICD-10-CM | POA: Diagnosis not present

## 2019-04-10 DIAGNOSIS — K8000 Calculus of gallbladder with acute cholecystitis without obstruction: Secondary | ICD-10-CM | POA: Diagnosis not present

## 2019-04-10 DIAGNOSIS — Z79899 Other long term (current) drug therapy: Secondary | ICD-10-CM | POA: Diagnosis not present

## 2019-04-10 DIAGNOSIS — K5900 Constipation, unspecified: Secondary | ICD-10-CM | POA: Diagnosis not present

## 2019-04-10 DIAGNOSIS — Z87891 Personal history of nicotine dependence: Secondary | ICD-10-CM | POA: Diagnosis not present

## 2019-04-10 DIAGNOSIS — I4510 Unspecified right bundle-branch block: Secondary | ICD-10-CM | POA: Diagnosis not present

## 2019-04-13 DIAGNOSIS — C9110 Chronic lymphocytic leukemia of B-cell type not having achieved remission: Secondary | ICD-10-CM | POA: Diagnosis not present

## 2019-04-13 DIAGNOSIS — I10 Essential (primary) hypertension: Secondary | ICD-10-CM | POA: Diagnosis not present

## 2019-04-13 DIAGNOSIS — D589 Hereditary hemolytic anemia, unspecified: Secondary | ICD-10-CM | POA: Diagnosis not present

## 2019-04-13 DIAGNOSIS — K5792 Diverticulitis of intestine, part unspecified, without perforation or abscess without bleeding: Secondary | ICD-10-CM | POA: Diagnosis not present

## 2019-04-13 DIAGNOSIS — Z6824 Body mass index (BMI) 24.0-24.9, adult: Secondary | ICD-10-CM | POA: Diagnosis not present

## 2019-04-13 DIAGNOSIS — Z299 Encounter for prophylactic measures, unspecified: Secondary | ICD-10-CM | POA: Diagnosis not present

## 2019-04-14 DIAGNOSIS — Z6824 Body mass index (BMI) 24.0-24.9, adult: Secondary | ICD-10-CM | POA: Diagnosis not present

## 2019-04-14 DIAGNOSIS — K8050 Calculus of bile duct without cholangitis or cholecystitis without obstruction: Secondary | ICD-10-CM | POA: Diagnosis not present

## 2019-04-14 DIAGNOSIS — K8020 Calculus of gallbladder without cholecystitis without obstruction: Secondary | ICD-10-CM | POA: Diagnosis not present

## 2019-04-14 DIAGNOSIS — K5732 Diverticulitis of large intestine without perforation or abscess without bleeding: Secondary | ICD-10-CM | POA: Diagnosis not present

## 2019-04-15 DIAGNOSIS — K8020 Calculus of gallbladder without cholecystitis without obstruction: Secondary | ICD-10-CM | POA: Diagnosis not present

## 2019-04-16 DIAGNOSIS — I10 Essential (primary) hypertension: Secondary | ICD-10-CM | POA: Diagnosis not present

## 2019-04-16 DIAGNOSIS — C9110 Chronic lymphocytic leukemia of B-cell type not having achieved remission: Secondary | ICD-10-CM | POA: Diagnosis not present

## 2019-04-16 DIAGNOSIS — R109 Unspecified abdominal pain: Secondary | ICD-10-CM | POA: Diagnosis not present

## 2019-04-16 DIAGNOSIS — Z6824 Body mass index (BMI) 24.0-24.9, adult: Secondary | ICD-10-CM | POA: Diagnosis not present

## 2019-04-16 DIAGNOSIS — Z299 Encounter for prophylactic measures, unspecified: Secondary | ICD-10-CM | POA: Diagnosis not present

## 2019-04-16 DIAGNOSIS — D589 Hereditary hemolytic anemia, unspecified: Secondary | ICD-10-CM | POA: Diagnosis not present

## 2019-04-27 ENCOUNTER — Telehealth: Payer: Self-pay

## 2019-04-27 NOTE — Telephone Encounter (Signed)
Patient called to cancel upcoming lab and visit with Dr. Alen Blew. Offered to reschedule visits and patient declined. Patient stated he will call the office if he decides to reschedule.

## 2019-04-28 DIAGNOSIS — Z1159 Encounter for screening for other viral diseases: Secondary | ICD-10-CM | POA: Diagnosis not present

## 2019-04-29 ENCOUNTER — Ambulatory Visit: Payer: Medicare Other | Admitting: Oncology

## 2019-04-29 ENCOUNTER — Other Ambulatory Visit: Payer: Medicare Other

## 2019-04-30 DIAGNOSIS — I10 Essential (primary) hypertension: Secondary | ICD-10-CM | POA: Diagnosis not present

## 2019-04-30 DIAGNOSIS — Z91048 Other nonmedicinal substance allergy status: Secondary | ICD-10-CM | POA: Diagnosis not present

## 2019-04-30 DIAGNOSIS — Z79899 Other long term (current) drug therapy: Secondary | ICD-10-CM | POA: Diagnosis not present

## 2019-04-30 DIAGNOSIS — K8010 Calculus of gallbladder with chronic cholecystitis without obstruction: Secondary | ICD-10-CM | POA: Diagnosis not present

## 2019-04-30 DIAGNOSIS — Z8572 Personal history of non-Hodgkin lymphomas: Secondary | ICD-10-CM | POA: Diagnosis not present

## 2019-04-30 DIAGNOSIS — J449 Chronic obstructive pulmonary disease, unspecified: Secondary | ICD-10-CM | POA: Diagnosis not present

## 2019-04-30 DIAGNOSIS — Z87891 Personal history of nicotine dependence: Secondary | ICD-10-CM | POA: Diagnosis not present

## 2019-04-30 DIAGNOSIS — K219 Gastro-esophageal reflux disease without esophagitis: Secondary | ICD-10-CM | POA: Diagnosis not present

## 2019-04-30 DIAGNOSIS — K5732 Diverticulitis of large intestine without perforation or abscess without bleeding: Secondary | ICD-10-CM | POA: Diagnosis not present

## 2019-05-04 DIAGNOSIS — K8010 Calculus of gallbladder with chronic cholecystitis without obstruction: Secondary | ICD-10-CM | POA: Diagnosis not present

## 2019-05-04 DIAGNOSIS — I10 Essential (primary) hypertension: Secondary | ICD-10-CM | POA: Diagnosis not present

## 2019-05-04 DIAGNOSIS — Z79899 Other long term (current) drug therapy: Secondary | ICD-10-CM | POA: Diagnosis not present

## 2019-05-04 DIAGNOSIS — K8020 Calculus of gallbladder without cholecystitis without obstruction: Secondary | ICD-10-CM | POA: Diagnosis not present

## 2019-05-04 DIAGNOSIS — K811 Chronic cholecystitis: Secondary | ICD-10-CM | POA: Diagnosis not present

## 2019-05-04 DIAGNOSIS — K5732 Diverticulitis of large intestine without perforation or abscess without bleeding: Secondary | ICD-10-CM | POA: Diagnosis not present

## 2019-05-04 DIAGNOSIS — Z8572 Personal history of non-Hodgkin lymphomas: Secondary | ICD-10-CM | POA: Diagnosis not present

## 2019-05-04 DIAGNOSIS — Z87891 Personal history of nicotine dependence: Secondary | ICD-10-CM | POA: Diagnosis not present

## 2019-05-18 DIAGNOSIS — Z299 Encounter for prophylactic measures, unspecified: Secondary | ICD-10-CM | POA: Diagnosis not present

## 2019-05-18 DIAGNOSIS — I10 Essential (primary) hypertension: Secondary | ICD-10-CM | POA: Diagnosis not present

## 2019-05-18 DIAGNOSIS — C9110 Chronic lymphocytic leukemia of B-cell type not having achieved remission: Secondary | ICD-10-CM | POA: Diagnosis not present

## 2019-05-25 DIAGNOSIS — C9110 Chronic lymphocytic leukemia of B-cell type not having achieved remission: Secondary | ICD-10-CM | POA: Diagnosis not present

## 2019-06-25 DIAGNOSIS — K5792 Diverticulitis of intestine, part unspecified, without perforation or abscess without bleeding: Secondary | ICD-10-CM | POA: Diagnosis not present

## 2019-06-25 DIAGNOSIS — I10 Essential (primary) hypertension: Secondary | ICD-10-CM | POA: Diagnosis not present

## 2019-06-25 DIAGNOSIS — Z6821 Body mass index (BMI) 21.0-21.9, adult: Secondary | ICD-10-CM | POA: Diagnosis not present

## 2019-06-25 DIAGNOSIS — C9110 Chronic lymphocytic leukemia of B-cell type not having achieved remission: Secondary | ICD-10-CM | POA: Diagnosis not present

## 2019-06-25 DIAGNOSIS — Z299 Encounter for prophylactic measures, unspecified: Secondary | ICD-10-CM | POA: Diagnosis not present

## 2019-08-13 DIAGNOSIS — I700 Atherosclerosis of aorta: Secondary | ICD-10-CM | POA: Diagnosis not present

## 2019-08-13 DIAGNOSIS — J069 Acute upper respiratory infection, unspecified: Secondary | ICD-10-CM | POA: Diagnosis not present

## 2019-08-13 DIAGNOSIS — Z299 Encounter for prophylactic measures, unspecified: Secondary | ICD-10-CM | POA: Diagnosis not present

## 2019-08-13 DIAGNOSIS — C9110 Chronic lymphocytic leukemia of B-cell type not having achieved remission: Secondary | ICD-10-CM | POA: Diagnosis not present

## 2019-08-13 DIAGNOSIS — R05 Cough: Secondary | ICD-10-CM | POA: Diagnosis not present

## 2019-08-13 DIAGNOSIS — I10 Essential (primary) hypertension: Secondary | ICD-10-CM | POA: Diagnosis not present

## 2019-08-13 DIAGNOSIS — Z6824 Body mass index (BMI) 24.0-24.9, adult: Secondary | ICD-10-CM | POA: Diagnosis not present

## 2019-08-30 ENCOUNTER — Telehealth: Payer: Self-pay | Admitting: Oncology

## 2019-08-30 NOTE — Telephone Encounter (Signed)
Returned patient's phone call regarding scheduling an appointment, left a voicemail. 

## 2019-09-02 ENCOUNTER — Telehealth: Payer: Self-pay | Admitting: Oncology

## 2019-09-02 ENCOUNTER — Encounter: Payer: Self-pay | Admitting: Pulmonary Disease

## 2019-09-02 ENCOUNTER — Other Ambulatory Visit: Payer: Self-pay

## 2019-09-02 ENCOUNTER — Ambulatory Visit (INDEPENDENT_AMBULATORY_CARE_PROVIDER_SITE_OTHER): Payer: Medicare Other | Admitting: Pulmonary Disease

## 2019-09-02 DIAGNOSIS — J432 Centrilobular emphysema: Secondary | ICD-10-CM

## 2019-09-02 DIAGNOSIS — R590 Localized enlarged lymph nodes: Secondary | ICD-10-CM | POA: Diagnosis not present

## 2019-09-02 NOTE — Patient Instructions (Signed)
Make appointment with Dr. Benetta Spar office is trying to reach you.  Your chest x-ray appearance is always hazy " prominent interstitium "  Dr. Alen Blew will probably order CT chest and abdomen to look at the lymph nodes and I can review it then

## 2019-09-02 NOTE — Progress Notes (Signed)
   Subjective:    Patient ID: Richard Evans, male    DOB: 1946/01/05, 73 y.o.   MRN: [id number]  HPI  73 yo ex heavy smoker referred for evaluation of abnormal chest x-ray He was seen in 2017 for hemoptysis and abnormal CT showing centrilobular emphysema  He was diagnosed with autoimmune hemolytic anemia in 06/2016 and CLL -when he presented with lymphocytosis, lymphadenopathy and underwent bone marrow biopsy He was treated with high-dose steroids in 2017 and then Rituxan in 2018  He smoked about a pack per day until 2010 when he quit-about 50-pack-years.  Chief Complaint  Patient presents with  . Shortness of Breath    Has improved since last visit in 2018. Had Chest xray with [facility] 08/13/19.    He returns after 2 years. He underwent cholecystectomy in 05/2019 Chest x-ray was obtained 08/15/2019 which showed diffuse interstitial prominence.  He presented with cough and pain behind the shoulder blades.  I have reviewed these films. This appears similar to his chest x-ray from 09/2016 which shows bilateral interstitial prominence especially in the right upper lobe  He denies any shortness of breath or cough or wheezing   Significant tests/ events reviewed  Spirometry 12/2016 - no evidence of airway obstruction with FEV1 of 85% and FVC of 81%   CT imaging 09/2016 showed underlying centrilobular emphysema and right upper lobe airspace disease which was new when compared to his CT from 06/2016 .  CT chest 11/2017 Stable to minimal progression of mild mediastinal and hilar lymphadenopathy Stable to slight decrease in size of the soft tissue lesion adjacent to the distal duodenum.  Review of Systems  neg for any significant sore throat, dysphagia, itching, sneezing, nasal congestion or excess/ purulent secretions, fever, chills, sweats, unintended wt loss, pleuritic or exertional cp, hempoptysis, orthopnea pnd or change in chronic leg swelling. Also denies  presyncope, palpitations, heartburn, abdominal pain, nausea, vomiting, diarrhea or change in bowel or urinary habits, dysuria,hematuria, rash, arthralgias, visual complaints, headache, numbness weakness or ataxia.     Objective:   Physical Exam   Gen. Pleasant, well-nourished, in no distress ENT - no thrush, no pallor/icterus,no post nasal drip Neck: No JVD, no thyromegaly, no carotid bruits Lungs: no use of accessory muscles, no dullness to percussion, bibasal  rales no rhonchi  Cardiovascular: Rhythm regular, heart sounds  normal, no murmurs or gallops, no peripheral edema Musculoskeletal: No deformities, no cyanosis or clubbing         Assessment & Plan:

## 2019-09-02 NOTE — Telephone Encounter (Signed)
Returned patient's phone call regarding scheduling an appointment, left a voicemail. 

## 2019-09-02 NOTE — Telephone Encounter (Signed)
Returned patient's phone call regarding rescheduling missed May appointment, per patient's request appointment has been moved to 10/29.

## 2019-09-02 NOTE — Assessment & Plan Note (Signed)
He needs to arrange a follow-up appointment with oncology again and will likely obtain another CTA chest and abdomen to look for lymphadenopathy-and when he does we will review CT chest images again

## 2019-09-02 NOTE — Assessment & Plan Note (Signed)
Chest x-ray shows interstitial prominence but this is similar to his prior chest x-ray from 2017.  Does not appear to be any worse.  He does have bibasal crackles which raises suspicion of ILD however on prior CT scans no ILD has been noted, only centrilobular emphysema.  Surprisingly spirometry shows no evidence of airway obstruction.  He likely would have a low DLCO but since he is asymptomatic we will defer full PFTs for now

## 2019-09-08 DIAGNOSIS — C9110 Chronic lymphocytic leukemia of B-cell type not having achieved remission: Secondary | ICD-10-CM | POA: Diagnosis not present

## 2019-09-08 DIAGNOSIS — R7309 Other abnormal glucose: Secondary | ICD-10-CM | POA: Diagnosis not present

## 2019-09-08 DIAGNOSIS — Z299 Encounter for prophylactic measures, unspecified: Secondary | ICD-10-CM | POA: Diagnosis not present

## 2019-09-08 DIAGNOSIS — Z6824 Body mass index (BMI) 24.0-24.9, adult: Secondary | ICD-10-CM | POA: Diagnosis not present

## 2019-09-08 DIAGNOSIS — R252 Cramp and spasm: Secondary | ICD-10-CM | POA: Diagnosis not present

## 2019-09-08 DIAGNOSIS — I10 Essential (primary) hypertension: Secondary | ICD-10-CM | POA: Diagnosis not present

## 2019-09-08 DIAGNOSIS — J432 Centrilobular emphysema: Secondary | ICD-10-CM | POA: Diagnosis not present

## 2019-09-08 DIAGNOSIS — D589 Hereditary hemolytic anemia, unspecified: Secondary | ICD-10-CM | POA: Diagnosis not present

## 2019-09-23 ENCOUNTER — Other Ambulatory Visit: Payer: Self-pay | Admitting: Oncology

## 2019-09-23 ENCOUNTER — Telehealth: Payer: Self-pay

## 2019-09-23 DIAGNOSIS — D599 Acquired hemolytic anemia, unspecified: Secondary | ICD-10-CM

## 2019-09-23 NOTE — Telephone Encounter (Signed)
Called patient spouse back and she stated that she was aware of the appointments tomorrow, 10/23. She is aware that the appointments scheduled for 10/29 are cancelled. Richard Evans verbalized understanding and had no other questions or concerns.

## 2019-09-23 NOTE — Telephone Encounter (Signed)
-----   Message from Baril Portela, MD sent at 09/23/2019  2:27 PM EDT ----- Regarding: RE: patient concern Lab + MD at 8:30 am on 10/23. Thanks.  ----- Message ----- From: Scot Dock, RN Sent: 09/23/2019   2:11 PM EDT To: Weisse Portela, MD Subject: patient concern                                Patient spouse called and stated for the past 2-3 weeks he has been weaker, more lethargic, and a little more SOB than usual. They are both concerned about waiting until his next appt, lab and MD, on 10/29 and are wondering if he can be seen sooner. Please advise.

## 2019-09-24 ENCOUNTER — Inpatient Hospital Stay: Payer: Medicare Other

## 2019-09-24 ENCOUNTER — Other Ambulatory Visit: Payer: Self-pay

## 2019-09-24 ENCOUNTER — Inpatient Hospital Stay: Payer: Medicare Other | Attending: Oncology | Admitting: Oncology

## 2019-09-24 ENCOUNTER — Telehealth: Payer: Self-pay | Admitting: Oncology

## 2019-09-24 VITALS — BP 140/64 | HR 68 | Temp 98.0°F | Resp 17 | Ht 72.0 in | Wt 179.1 lb

## 2019-09-24 DIAGNOSIS — Z79899 Other long term (current) drug therapy: Secondary | ICD-10-CM | POA: Diagnosis not present

## 2019-09-24 DIAGNOSIS — R599 Enlarged lymph nodes, unspecified: Secondary | ICD-10-CM | POA: Diagnosis not present

## 2019-09-24 DIAGNOSIS — D591 Autoimmune hemolytic anemia, unspecified: Secondary | ICD-10-CM | POA: Insufficient documentation

## 2019-09-24 DIAGNOSIS — Z888 Allergy status to other drugs, medicaments and biological substances status: Secondary | ICD-10-CM | POA: Insufficient documentation

## 2019-09-24 DIAGNOSIS — D696 Thrombocytopenia, unspecified: Secondary | ICD-10-CM | POA: Diagnosis not present

## 2019-09-24 DIAGNOSIS — R5383 Other fatigue: Secondary | ICD-10-CM | POA: Insufficient documentation

## 2019-09-24 DIAGNOSIS — C9110 Chronic lymphocytic leukemia of B-cell type not having achieved remission: Secondary | ICD-10-CM | POA: Diagnosis not present

## 2019-09-24 DIAGNOSIS — D599 Acquired hemolytic anemia, unspecified: Secondary | ICD-10-CM

## 2019-09-24 LAB — CBC WITH DIFFERENTIAL (CANCER CENTER ONLY)
Abs Immature Granulocytes: 0.07 10*3/uL (ref 0.00–0.07)
Basophils Absolute: 0 10*3/uL (ref 0.0–0.1)
Basophils Relative: 0 %
Eosinophils Absolute: 0.3 10*3/uL (ref 0.0–0.5)
Eosinophils Relative: 2 %
HCT: 34.2 % — ABNORMAL LOW (ref 39.0–52.0)
Hemoglobin: 12.3 g/dL — ABNORMAL LOW (ref 13.0–17.0)
Immature Granulocytes: 1 %
Lymphocytes Relative: 42 %
Lymphs Abs: 4.7 10*3/uL — ABNORMAL HIGH (ref 0.7–4.0)
MCH: 36.6 pg — ABNORMAL HIGH (ref 26.0–34.0)
MCHC: 36 g/dL (ref 30.0–36.0)
MCV: 101.8 fL — ABNORMAL HIGH (ref 80.0–100.0)
Monocytes Absolute: 1.3 10*3/uL — ABNORMAL HIGH (ref 0.1–1.0)
Monocytes Relative: 11 %
Neutro Abs: 4.9 10*3/uL (ref 1.7–7.7)
Neutrophils Relative %: 44 %
Platelet Count: 140 10*3/uL — ABNORMAL LOW (ref 150–400)
RBC: 3.36 MIL/uL — ABNORMAL LOW (ref 4.22–5.81)
RDW: 22.8 % — ABNORMAL HIGH (ref 11.5–15.5)
WBC Count: 11.3 10*3/uL — ABNORMAL HIGH (ref 4.0–10.5)
nRBC: 0 % (ref 0.0–0.2)

## 2019-09-24 LAB — CMP (CANCER CENTER ONLY)
ALT: 16 U/L (ref 0–44)
AST: 16 U/L (ref 15–41)
Albumin: 4.3 g/dL (ref 3.5–5.0)
Alkaline Phosphatase: 84 U/L (ref 38–126)
Anion gap: 8 (ref 5–15)
BUN: 22 mg/dL (ref 8–23)
CO2: 23 mmol/L (ref 22–32)
Calcium: 9.1 mg/dL (ref 8.9–10.3)
Chloride: 109 mmol/L (ref 98–111)
Creatinine: 1.17 mg/dL (ref 0.61–1.24)
GFR, Est AFR Am: >60 mL/min (ref >60)
GFR, Estimated: >60 mL/min (ref >60)
Glucose, Bld: 187 mg/dL — ABNORMAL HIGH (ref 70–99)
Potassium: 4.2 mmol/L (ref 3.5–5.1)
Sodium: 140 mmol/L (ref 135–145)
Total Bilirubin: 1.6 mg/dL — ABNORMAL HIGH (ref 0.3–1.2)
Total Protein: 7.4 g/dL (ref 6.5–8.1)

## 2019-09-24 LAB — LACTATE DEHYDROGENASE: LDH: 312 U/L — ABNORMAL HIGH (ref 98–192)

## 2019-09-24 LAB — DIRECT ANTIGLOBULIN TEST (NOT AT ARMC)
DAT, IgG: POSITIVE
DAT, complement: POSITIVE

## 2019-09-24 NOTE — Telephone Encounter (Signed)
Scheduled per los. Called and spoke with patient. Confirmed appts  

## 2019-09-24 NOTE — Progress Notes (Signed)
Hematology and Oncology Follow Up Visit  Richard Evans [id number] 10/27/1946 73 y.o. 09/24/2019 8:18 AM [name], Richard Evans, MDVyas, Dhruv B, MD   Principle Diagnosis: 73 year old man with:  1.  Chronic lymphocytic leukemia presented with lymphocytosis and adenopathy diagnosed in 2017.   2.  Autoimmune hemolytic anemia diagnosed in 2017 associated with CLL.    Prior Therapy:    Prednisone 70 mg daily started on 06/30/2016. He achieved complete response and prednisone tapered completely off on 09/11/2016. He relapsed in October 2017.  Prednisone 90 mg daily started on 09/30/2016. He achieved complete response on 10/17/2016. He completed prednisone taper on 12/18/2016.  Rituximab at 375 mg/m weekly started on 08/08/2017. He is status post 4 weeks of therapy completed on 08/27/2017.  Prednisone 60 mg daily started on 09/17/2017.  He was tapered off in December 2018 after achieving a complete response.  Current therapy: Active surveillance.    Interim History:  [name] is here for return evaluation.  Since the last visit, he reports feeling reasonably well after the last few weeks where he has noticed a slightly more fatigued.  His appetite remain excellent and continues to attend activities of daily living.  He denies any fevers, chills or weight loss.  His appetite and performance status remains unchanged.  He reports some slight decrease in his exercise tolerance compared to a last few months.   He denied headaches, blurry vision, syncope or seizures.  Denies any fevers, chills or sweats.  Denied chest pain, palpitation, orthopnea or leg edema.  Denied cough, wheezing or hemoptysis.  Denied nausea, vomiting or abdominal pain.  Denies any constipation or diarrhea.  Denies any frequency urgency or hesitancy.  Denies any arthralgias or myalgias.  Denies any skin rashes or lesions.  Denies any bleeding or clotting tendency.  Denies any easy bruising.  Denies any hair or nail changes.  Denies any  anxiety or depression.  Remaining review of system is negative.           Medications: Updated on review today. Current Outpatient Medications  Medication Sig Dispense Refill  . atenolol (TENORMIN) 25 MG tablet Take 25 mg by mouth at bedtime.     . Cholecalciferol (VITAMIN D) 2000 units CAPS Take 1 capsule by mouth daily.    . diphenhydrAMINE (BENADRYL) 25 mg capsule Take 25 mg by mouth every 6 (six) hours as needed.    . folic acid (FOLVITE) 1 MG tablet TAKE ONE TABLET BY MOUTH EVERY DAY 30 tablet 2  . polyethylene glycol powder (CLEARLAX) powder Take 17 g by mouth daily as needed (constipation).    . Tamsulosin HCl (FLOMAX) 0.4 MG CAPS Take 0.4 mg by mouth at bedtime.      No current facility-administered medications for this visit.      Allergies:  Allergies  Allergen Reactions  . Adhesive [Tape] Itching and Rash    Paper tape or wrapped bandage is preferred    Past Medical History, Surgical history, Social history, and Family History unchanged on review.    Physical Exam:   Blood pressure 140/64, pulse 68, temperature 98 F (36.7 C), temperature source Oral, resp. rate 17, height 6' (1.829 m), weight 179 lb 1.6 oz (81.2 kg), SpO2 95 %.     ECOG: 0    General appearance: Comfortable appearing without any discomfort Head: Normocephalic without any trauma Oropharynx: Mucous membranes are moist and pink without any thrush or ulcers. Eyes: Pupils are equal and round reactive to light. Lymph nodes: No  cervical, supraclavicular, inguinal or axillary lymphadenopathy.   Heart:regular rate and rhythm.  S1 and S2 without leg edema. Lung: Clear without any rhonchi or wheezes.  No dullness to percussion. Abdomin: Soft, nontender, nondistended with good bowel sounds.  No hepatosplenomegaly. Musculoskeletal: No joint deformity or effusion.  Full range of motion noted. Neurological: No deficits noted on motor, sensory and deep tendon reflex exam. Skin: No petechial rash  or dryness.  Appeared moist.       Lab Results: Lab Results  Component Value Date   WBC 13.0 (H) 01/27/2019   HGB 14.0 01/27/2019   HCT 40.9 01/27/2019   MCV 91.1 01/27/2019   PLT 142 (L) 01/27/2019     Chemistry      Component Value Date/Time   NA 138 08/12/2018 0804   NA 141 12/03/2017 0741   K 4.1 08/12/2018 0804   K 4.1 12/03/2017 0741   CL 107 08/12/2018 0804   CO2 21 (L) 08/12/2018 0804   CO2 22 12/03/2017 0741   BUN 21 08/12/2018 0804   BUN 22.1 12/03/2017 0741   CREATININE 1.10 08/12/2018 0804   CREATININE 1.1 12/03/2017 0741      Component Value Date/Time   CALCIUM 9.4 08/12/2018 0804   CALCIUM 9.3 12/03/2017 0741   ALKPHOS 80 08/12/2018 0804   ALKPHOS 66 12/03/2017 0741   AST 15 08/12/2018 0804   AST 16 12/03/2017 0741   ALT 12 08/12/2018 0804   ALT 22 12/03/2017 0741   BILITOT 1.2 08/12/2018 0804   BILITOT 1.01 12/03/2017 0741      G ROSS AND MICROSCOPIC INFORMATION  Impression and Plan:   73 year old man with:   1. CLL diagnosed in 2017 after he was found to have lymphocytosis and adenopathy as well as autoimmune hemolytic anemia.    He has not received any treatment beyond rituximab for his autoimmune hemolytic anemia and has been overall asymptomatic from this disease.  He denies any bulky adenopathy or constitutional symptoms.  Indication for treatment would include symptomatic adenopathy, bone marrow failure, constitutional symptoms among others.  He does not have any signs or symptoms to suggest progression of disease.  His white cell count remains relatively stable.  2. Autoimmune hemolytic anemia: He has been off any treatment for his anemia and has been on active surveillance.  His hemoglobin today is slightly down at 12.3 with increased MCV.  This could be a sign of reactivation of his autoimmune hemolytic anemia although I see no indication at this time to start treatment.  Is LDH, haptoglobin as well as Coombs testing is pending at  this time.  I recommended strict monitoring and restarting prednisone if needed.    3. Thrombocytopenia: Has been very mild in the past and has not required any treatment.  His platelet count remains adequate at this time.  4. Follow-up: We will continue to follow-up every 2 weeks for laboratory testing and MD follow-up and 4 weeks.  25 minutes was spent with the patient today face-to-face.  More than 50% of the time was spent on reviewing laboratory data, discussing his disease status, management options and future plan of care.  Zola Button, MD 10/23/20208:18 AM

## 2019-09-25 LAB — HAPTOGLOBIN: Haptoglobin: <10 mg/dL — ABNORMAL LOW (ref 34–355)

## 2019-09-30 ENCOUNTER — Inpatient Hospital Stay: Payer: Medicare Other

## 2019-09-30 ENCOUNTER — Inpatient Hospital Stay: Payer: Medicare Other | Admitting: Oncology

## 2019-10-07 ENCOUNTER — Other Ambulatory Visit: Payer: Medicare Other

## 2019-10-08 ENCOUNTER — Telehealth: Payer: Self-pay

## 2019-10-08 ENCOUNTER — Other Ambulatory Visit: Payer: Self-pay

## 2019-10-08 ENCOUNTER — Inpatient Hospital Stay: Payer: Medicare Other | Attending: Oncology

## 2019-10-08 DIAGNOSIS — Z79899 Other long term (current) drug therapy: Secondary | ICD-10-CM | POA: Insufficient documentation

## 2019-10-08 DIAGNOSIS — D696 Thrombocytopenia, unspecified: Secondary | ICD-10-CM | POA: Diagnosis not present

## 2019-10-08 DIAGNOSIS — C9110 Chronic lymphocytic leukemia of B-cell type not having achieved remission: Secondary | ICD-10-CM | POA: Insufficient documentation

## 2019-10-08 DIAGNOSIS — R599 Enlarged lymph nodes, unspecified: Secondary | ICD-10-CM | POA: Diagnosis not present

## 2019-10-08 DIAGNOSIS — D591 Autoimmune hemolytic anemia, unspecified: Secondary | ICD-10-CM | POA: Insufficient documentation

## 2019-10-08 DIAGNOSIS — D599 Acquired hemolytic anemia, unspecified: Secondary | ICD-10-CM

## 2019-10-08 DIAGNOSIS — R7402 Elevation of levels of lactic acid dehydrogenase (LDH): Secondary | ICD-10-CM | POA: Insufficient documentation

## 2019-10-08 DIAGNOSIS — Z888 Allergy status to other drugs, medicaments and biological substances status: Secondary | ICD-10-CM | POA: Diagnosis not present

## 2019-10-08 LAB — CBC WITH DIFFERENTIAL (CANCER CENTER ONLY)
Abs Immature Granulocytes: 0.05 10*3/uL (ref 0.00–0.07)
Basophils Absolute: 0 10*3/uL (ref 0.0–0.1)
Basophils Relative: 0 %
Eosinophils Absolute: 0.2 10*3/uL (ref 0.0–0.5)
Eosinophils Relative: 2 %
HCT: 35.8 % — ABNORMAL LOW (ref 39.0–52.0)
Hemoglobin: 12.5 g/dL — ABNORMAL LOW (ref 13.0–17.0)
Immature Granulocytes: 1 %
Lymphocytes Relative: 47 %
Lymphs Abs: 5.2 10*3/uL — ABNORMAL HIGH (ref 0.7–4.0)
MCH: 35.2 pg — ABNORMAL HIGH (ref 26.0–34.0)
MCHC: 34.9 g/dL (ref 30.0–36.0)
MCV: 100.8 fL — ABNORMAL HIGH (ref 80.0–100.0)
Monocytes Absolute: 1.4 10*3/uL — ABNORMAL HIGH (ref 0.1–1.0)
Monocytes Relative: 12 %
Neutro Abs: 4.1 10*3/uL (ref 1.7–7.7)
Neutrophils Relative %: 38 %
Platelet Count: 126 10*3/uL — ABNORMAL LOW (ref 150–400)
RBC: 3.55 MIL/uL — ABNORMAL LOW (ref 4.22–5.81)
RDW: 19.3 % — ABNORMAL HIGH (ref 11.5–15.5)
WBC Count: 11 10*3/uL — ABNORMAL HIGH (ref 4.0–10.5)
nRBC: 0 % (ref 0.0–0.2)

## 2019-10-08 LAB — SAMPLE TO BLOOD BANK

## 2019-10-08 NOTE — Telephone Encounter (Signed)
-----   Message from Hoey Portela, MD sent at 10/08/2019  8:11 AM EST ----- I sent this to [facility] by error. Sorry ----- Message ----- From: Bibian Portela, MD Sent: 10/08/2019   8:10 AM EST To: Tami Lin, RN  Please let him know his Hgb is still ok and unchanged from 2 weeks. He might be still here.

## 2019-10-08 NOTE — Telephone Encounter (Signed)
Called and informed patient his Hgb is unchanged from 2 weeks ago. Patient verbalized understanding. Patient instructed to call office with any questions or concerns.

## 2019-10-22 ENCOUNTER — Inpatient Hospital Stay (HOSPITAL_BASED_OUTPATIENT_CLINIC_OR_DEPARTMENT_OTHER): Payer: Medicare Other | Admitting: Oncology

## 2019-10-22 ENCOUNTER — Other Ambulatory Visit: Payer: Self-pay

## 2019-10-22 ENCOUNTER — Inpatient Hospital Stay: Payer: Medicare Other

## 2019-10-22 VITALS — BP 138/60 | HR 61 | Temp 97.9°F | Resp 17 | Ht 72.0 in | Wt 180.2 lb

## 2019-10-22 DIAGNOSIS — R7402 Elevation of levels of lactic acid dehydrogenase (LDH): Secondary | ICD-10-CM | POA: Diagnosis not present

## 2019-10-22 DIAGNOSIS — D599 Acquired hemolytic anemia, unspecified: Secondary | ICD-10-CM

## 2019-10-22 DIAGNOSIS — Z79899 Other long term (current) drug therapy: Secondary | ICD-10-CM | POA: Diagnosis not present

## 2019-10-22 DIAGNOSIS — C9110 Chronic lymphocytic leukemia of B-cell type not having achieved remission: Secondary | ICD-10-CM | POA: Diagnosis not present

## 2019-10-22 DIAGNOSIS — C8590 Non-Hodgkin lymphoma, unspecified, unspecified site: Secondary | ICD-10-CM | POA: Diagnosis not present

## 2019-10-22 DIAGNOSIS — R599 Enlarged lymph nodes, unspecified: Secondary | ICD-10-CM | POA: Diagnosis not present

## 2019-10-22 DIAGNOSIS — D696 Thrombocytopenia, unspecified: Secondary | ICD-10-CM | POA: Diagnosis not present

## 2019-10-22 DIAGNOSIS — D591 Autoimmune hemolytic anemia, unspecified: Secondary | ICD-10-CM | POA: Diagnosis not present

## 2019-10-22 LAB — CBC WITH DIFFERENTIAL (CANCER CENTER ONLY)
Abs Immature Granulocytes: 0.08 10*3/uL — ABNORMAL HIGH (ref 0.00–0.07)
Basophils Absolute: 0.1 10*3/uL (ref 0.0–0.1)
Basophils Relative: 1 %
Eosinophils Absolute: 0.2 10*3/uL (ref 0.0–0.5)
Eosinophils Relative: 2 %
HCT: 35.8 % — ABNORMAL LOW (ref 39.0–52.0)
Hemoglobin: 12.8 g/dL — ABNORMAL LOW (ref 13.0–17.0)
Immature Granulocytes: 1 %
Lymphocytes Relative: 46 %
Lymphs Abs: 5.1 10*3/uL — ABNORMAL HIGH (ref 0.7–4.0)
MCH: 37.3 pg — ABNORMAL HIGH (ref 26.0–34.0)
MCHC: 35.8 g/dL (ref 30.0–36.0)
MCV: 104.4 fL — ABNORMAL HIGH (ref 80.0–100.0)
Monocytes Absolute: 1.3 10*3/uL — ABNORMAL HIGH (ref 0.1–1.0)
Monocytes Relative: 12 %
Neutro Abs: 4.2 10*3/uL (ref 1.7–7.7)
Neutrophils Relative %: 38 %
Platelet Count: 135 10*3/uL — ABNORMAL LOW (ref 150–400)
RBC: 3.43 MIL/uL — ABNORMAL LOW (ref 4.22–5.81)
RDW: 20.5 % — ABNORMAL HIGH (ref 11.5–15.5)
WBC Count: 10.9 10*3/uL — ABNORMAL HIGH (ref 4.0–10.5)
nRBC: 0 % (ref 0.0–0.2)

## 2019-10-22 LAB — SAMPLE TO BLOOD BANK

## 2019-10-22 NOTE — Progress Notes (Signed)
Hematology and Oncology Follow Up Visit  Richard Evans [id number] [birth year] 73 y.o. 10/22/2019 8:33 AM [name], Richard Evans, MDVyas, Dhruv B, MD   Principle Diagnosis: 73 year old man with:  1.  Chronic lymphocytic leukemia diagnosed in 2017 after he was found to have lymphocytosis and adenopathy.    2.  Autoimmune hemolytic anemia related to his CLL noted in 2017.      Prior Therapy:    Prednisone 70 mg daily started on 06/30/2016. He achieved complete response and prednisone tapered completely off on 09/11/2016. He relapsed in October 2017.  Prednisone 90 mg daily started on 09/30/2016. He achieved complete response on 10/17/2016. He completed prednisone taper on 12/18/2016.  Rituximab at 375 mg/m weekly started on 08/08/2017. He is status post 4 weeks of therapy completed on 08/27/2017.  Prednisone 60 mg daily started on 09/17/2017.  He was tapered off in December 2018 after achieving a complete response.  Current therapy: Active surveillance.    Interim History:  [name] presents today for a follow-up visit.  Since the last visit,  Patient denied any alteration mental status, neuropathy, confusion or dizziness.  Denies any headaches or lethargy.  Denies any night sweats, weight loss or changes in appetite.  Denied orthopnea, dyspnea on exertion or chest discomfort.  Denies shortness of breath, difficulty breathing hemoptysis or cough.  Denies any abdominal distention, nausea, early satiety or dyspepsia.  Denies any hematuria, frequency, dysuria or nocturia.  Denies any skin irritation, dryness or rash.  Denies any ecchymosis or petechiae.  Denies any lymphadenopathy or clotting.  Denies any heat or cold intolerance.  Denies any anxiety or depression.  Remaining review of system is negative.           Medications: Reviewed without any changes today. Current Outpatient Medications  Medication Sig Dispense Refill  . atenolol (TENORMIN) 25 MG tablet Take 25 mg by mouth at  bedtime.     . Cholecalciferol (VITAMIN D) 2000 units CAPS Take 1 capsule by mouth daily.    . diphenhydrAMINE (BENADRYL) 25 mg capsule Take 25 mg by mouth every 6 (six) hours as needed.    . folic acid (FOLVITE) 1 MG tablet TAKE ONE TABLET BY MOUTH EVERY DAY 30 tablet 2  . polyethylene glycol powder (CLEARLAX) powder Take 17 g by mouth daily as needed (constipation).    . Tamsulosin HCl (FLOMAX) 0.4 MG CAPS Take 0.4 mg by mouth at bedtime.      No current facility-administered medications for this visit.      Allergies:  Allergies  Allergen Reactions  . Adhesive [Tape] Itching and Rash    Paper tape or wrapped bandage is preferred    Past Medical History, Surgical history, Social history, and Family History reviewed and unchanged.  Physical Exam:   Blood pressure 138/60, pulse 61, temperature 97.9 F (36.6 C), temperature source Temporal, resp. rate 17, height 6' (1.829 m), weight 180 lb 3.2 oz (81.7 kg), SpO2 95 %.     ECOG: 0    General appearance: Alert, awake without any distress. Head: Atraumatic without abnormalities Oropharynx: Without any thrush or ulcers. Eyes: No scleral icterus. Lymph nodes: No lymphadenopathy noted in the cervical, supraclavicular, or axillary nodes Heart:regular rate and rhythm, without any murmurs or gallops.   Lung: Clear to auscultation without any rhonchi, wheezes or dullness to percussion. Abdomin: Soft, nontender without any shifting dullness or ascites. Musculoskeletal: No clubbing or cyanosis. Neurological: No motor or sensory deficits. Skin: No rashes or lesions. Psychiatric: Mood and  affect appeared normal.       Lab Results: Lab Results  Component Value Date   WBC 10.9 (H) 10/22/2019   HGB 12.8 (L) 10/22/2019   HCT 35.8 (L) 10/22/2019   MCV 104.4 (H) 10/22/2019   PLT 135 (L) 10/22/2019     Chemistry      Component Value Date/Time   NA 140 09/24/2019 0810   NA 141 12/03/2017 0741   K 4.2 09/24/2019 0810   K 4.1  12/03/2017 0741   CL 109 09/24/2019 0810   CO2 23 09/24/2019 0810   CO2 22 12/03/2017 0741   BUN 22 09/24/2019 0810   BUN 22.1 12/03/2017 0741   CREATININE 1.17 09/24/2019 0810   CREATININE 1.1 12/03/2017 0741      Component Value Date/Time   CALCIUM 9.1 09/24/2019 0810   CALCIUM 9.3 12/03/2017 0741   ALKPHOS 84 09/24/2019 0810   ALKPHOS 66 12/03/2017 0741   AST 16 09/24/2019 0810   AST 16 12/03/2017 0741   ALT 16 09/24/2019 0810   ALT 22 12/03/2017 0741   BILITOT 1.6 (H) 09/24/2019 0810   BILITOT 1.01 12/03/2017 0741      G ROSS AND MICROSCOPIC INFORMATION  Impression and Plan:   73 year old man with:   1. CLL presented with lymphocytosis and adenopathy in 2017.   He is status post treatment outlined above predominantly rituximab but no additional therapy.  He remains asymptomatic from his disease without any lymphadenopathy or constitutional symptoms.  I recommended continued active surveillance for the time being and we have discussed the indication for treatment including symptomatic lymphadenopathy as well as constitutional symptoms or marrow failure.  Treatment options for this condition including chemotherapy as well as oral targeted therapy with ibrutinib.  2. Autoimmune hemolytic anemia: His hemoglobin continues to be adequate at this time although he does have element of hemolysis as evidenced by mildly elevated LDH and positive Coombs testing.  As long as his hemoglobin is compensated at this time no additional treatment will be required and we will continue to monitor closely.    3. Thrombocytopenia: Platelet count is 135,000 without any active bleeding.  Plan is to continue with monitoring at this time.  Etiology is likely autoimmune in nature.     4. Follow-up: In 6 weeks for repeat evaluation.  25 minutes was spent with the patient today face-to-face.  More than 50% of the time was dedicated to updating his disease status, treatment options as well as  outlining future plan of care.  Zola Button, MD 11/20/20208:33 AM

## 2019-10-25 ENCOUNTER — Telehealth: Payer: Self-pay | Admitting: Oncology

## 2019-10-25 NOTE — Telephone Encounter (Signed)
Scheduled appt per 11/20 los.  Per the los Lab + MD (8:30) on 1/7, was not able to add a lab before the MD visit due to the lab being in the meeting.  Per MD okay to schedule lab and f/u on a different day.  Spoke with patient spouse and she is aware of the appt date and time,

## 2019-11-04 DIAGNOSIS — M67442 Ganglion, left hand: Secondary | ICD-10-CM | POA: Diagnosis not present

## 2019-11-04 DIAGNOSIS — M79645 Pain in left finger(s): Secondary | ICD-10-CM | POA: Diagnosis not present

## 2019-12-14 ENCOUNTER — Inpatient Hospital Stay (HOSPITAL_BASED_OUTPATIENT_CLINIC_OR_DEPARTMENT_OTHER): Payer: PPO | Admitting: Oncology

## 2019-12-14 ENCOUNTER — Other Ambulatory Visit: Payer: Self-pay

## 2019-12-14 ENCOUNTER — Telehealth: Payer: Self-pay | Admitting: Oncology

## 2019-12-14 ENCOUNTER — Inpatient Hospital Stay: Payer: PPO | Attending: Oncology

## 2019-12-14 VITALS — BP 133/64 | HR 59 | Temp 98.5°F | Resp 16 | Ht 72.0 in | Wt 183.3 lb

## 2019-12-14 DIAGNOSIS — D591 Autoimmune hemolytic anemia, unspecified: Secondary | ICD-10-CM | POA: Insufficient documentation

## 2019-12-14 DIAGNOSIS — C8590 Non-Hodgkin lymphoma, unspecified, unspecified site: Secondary | ICD-10-CM

## 2019-12-14 DIAGNOSIS — D696 Thrombocytopenia, unspecified: Secondary | ICD-10-CM | POA: Diagnosis not present

## 2019-12-14 DIAGNOSIS — Z888 Allergy status to other drugs, medicaments and biological substances status: Secondary | ICD-10-CM | POA: Insufficient documentation

## 2019-12-14 DIAGNOSIS — C9110 Chronic lymphocytic leukemia of B-cell type not having achieved remission: Secondary | ICD-10-CM | POA: Insufficient documentation

## 2019-12-14 DIAGNOSIS — Z79899 Other long term (current) drug therapy: Secondary | ICD-10-CM | POA: Diagnosis not present

## 2019-12-14 DIAGNOSIS — D599 Acquired hemolytic anemia, unspecified: Secondary | ICD-10-CM

## 2019-12-14 DIAGNOSIS — R599 Enlarged lymph nodes, unspecified: Secondary | ICD-10-CM | POA: Insufficient documentation

## 2019-12-14 LAB — CMP (CANCER CENTER ONLY)
ALT: 18 U/L (ref 0–44)
AST: 22 U/L (ref 15–41)
Albumin: 4.4 g/dL (ref 3.5–5.0)
Alkaline Phosphatase: 79 U/L (ref 38–126)
Anion gap: 11 (ref 5–15)
BUN: 20 mg/dL (ref 8–23)
CO2: 22 mmol/L (ref 22–32)
Calcium: 8.6 mg/dL — ABNORMAL LOW (ref 8.9–10.3)
Chloride: 109 mmol/L (ref 98–111)
Creatinine: 1.17 mg/dL (ref 0.61–1.24)
GFR, Est AFR Am: >60 mL/min (ref >60)
GFR, Estimated: >60 mL/min (ref >60)
Glucose, Bld: 185 mg/dL — ABNORMAL HIGH (ref 70–99)
Potassium: 4.2 mmol/L (ref 3.5–5.1)
Sodium: 142 mmol/L (ref 135–145)
Total Bilirubin: 2.2 mg/dL — ABNORMAL HIGH (ref 0.3–1.2)
Total Protein: 7.4 g/dL (ref 6.5–8.1)

## 2019-12-14 LAB — CBC WITH DIFFERENTIAL (CANCER CENTER ONLY)
Abs Immature Granulocytes: 0.09 10*3/uL — ABNORMAL HIGH (ref 0.00–0.07)
Basophils Absolute: 0.1 10*3/uL (ref 0.0–0.1)
Basophils Relative: 1 %
Eosinophils Absolute: 0.2 10*3/uL (ref 0.0–0.5)
Eosinophils Relative: 2 %
HCT: 33.2 % — ABNORMAL LOW (ref 39.0–52.0)
Hemoglobin: 12 g/dL — ABNORMAL LOW (ref 13.0–17.0)
Immature Granulocytes: 1 %
Lymphocytes Relative: 46 %
Lymphs Abs: 5.2 10*3/uL — ABNORMAL HIGH (ref 0.7–4.0)
MCH: 37.6 pg — ABNORMAL HIGH (ref 26.0–34.0)
MCHC: 36.1 g/dL — ABNORMAL HIGH (ref 30.0–36.0)
MCV: 104.1 fL — ABNORMAL HIGH (ref 80.0–100.0)
Monocytes Absolute: 1.3 10*3/uL — ABNORMAL HIGH (ref 0.1–1.0)
Monocytes Relative: 11 %
Neutro Abs: 4.3 10*3/uL (ref 1.7–7.7)
Neutrophils Relative %: 39 %
Platelet Count: 120 10*3/uL — ABNORMAL LOW (ref 150–400)
RBC: 3.19 MIL/uL — ABNORMAL LOW (ref 4.22–5.81)
RDW: 20.2 % — ABNORMAL HIGH (ref 11.5–15.5)
WBC Count: 11.1 10*3/uL — ABNORMAL HIGH (ref 4.0–10.5)
nRBC: 0 % (ref 0.0–0.2)

## 2019-12-14 LAB — SAMPLE TO BLOOD BANK

## 2019-12-14 NOTE — Progress Notes (Signed)
Hematology and Oncology Follow Up Visit  Richard Evans [id number] [birth year] 74 y.o. 12/14/2019 9:52 AM [name], [name], MDVyas, Dhruv B, MD   Principle Diagnosis: 74 year old man with CLL (chronic lymphocytic leukemia) presented with lymphocytosis and adenopathy diagnosed in 2017.  He subsequently developed autoimmune hemolytic anemia at the same time.      Prior Therapy:    Prednisone 70 mg daily started on 06/30/2016. He achieved complete response and prednisone tapered completely off on 09/11/2016. He relapsed in October 2017.  Prednisone 90 mg daily started on 09/30/2016. He achieved complete response on 10/17/2016. He completed prednisone taper on 12/18/2016.  Rituximab at 375 mg/m weekly started on 08/08/2017. He is status post 4 weeks of therapy completed on 08/27/2017.  Prednisone 60 mg daily started on 09/17/2017.  He was tapered off in December 2018 after achieving a complete response.  Current therapy: Active surveillance.    Interim History:  Richard Evans returns today for a follow-up.  Since the last visit he continues to feel reasonably well at this time.  He denies any excessive fatigue tiredness or recent hospitalizations.  Denies any abdominal pain or discomfort.  He denies any changes in his urinary habits.  He denies any painful adenopathy or constitutional symptoms.  .           Medications: Unchanged on review. Current Outpatient Medications  Medication Sig Dispense Refill  . atenolol (TENORMIN) 25 MG tablet Take 25 mg by mouth at bedtime.     . Cholecalciferol (VITAMIN D) 2000 units CAPS Take 1 capsule by mouth daily.    . diphenhydrAMINE (BENADRYL) 25 mg capsule Take 25 mg by mouth every 6 (six) hours as needed.    . folic acid (FOLVITE) 1 MG tablet TAKE ONE TABLET BY MOUTH EVERY DAY 30 tablet 2  . polyethylene glycol powder (CLEARLAX) powder Take 17 g by mouth daily as needed (constipation).    . Tamsulosin HCl (FLOMAX) 0.4 MG CAPS Take 0.4 mg by mouth  at bedtime.      No current facility-administered medications for this visit.     Allergies:  Allergies  Allergen Reactions  . Adhesive [Tape] Itching and Rash    Paper tape or wrapped bandage is preferred    Past Medical History, Surgical history, Social history, and Family History updated without changes  Physical Exam:   Blood pressure 133/64, pulse (!) 59, temperature 98.5 F (36.9 C), temperature source Temporal, resp. rate 16, height 6' (1.829 m), weight 183 lb 4.8 oz (83.1 kg), SpO2 93 %.     ECOG: 0    General appearance: Comfortable appearing without any discomfort Head: Normocephalic without any trauma Oropharynx: Mucous membranes are moist and pink without any thrush or ulcers. Eyes: Pupils are equal and round reactive to light. Lymph nodes: No cervical, supraclavicular, inguinal or axillary lymphadenopathy.   Heart:regular rate and rhythm.  S1 and S2 without leg edema. Lung: Clear without any rhonchi or wheezes.  No dullness to percussion. Abdomin: Soft, nontender, nondistended with good bowel sounds.  No hepatosplenomegaly. Musculoskeletal: No joint deformity or effusion.  Full range of motion noted. Neurological: No deficits noted on motor, sensory and deep tendon reflex exam. Skin: No petechial rash or dryness.  Appeared moist.         Lab Results: Lab Results  Component Value Date   WBC 11.1 (H) 12/14/2019   HGB 12.0 (L) 12/14/2019   HCT 33.2 (L) 12/14/2019   MCV 104.1 (H) 12/14/2019   PLT 120 (L)  12/14/2019     Chemistry      Component Value Date/Time   NA 140 09/24/2019 0810   NA 141 12/03/2017 0741   K 4.2 09/24/2019 0810   K 4.1 12/03/2017 0741   CL 109 09/24/2019 0810   CO2 23 09/24/2019 0810   CO2 22 12/03/2017 0741   BUN 22 09/24/2019 0810   BUN 22.1 12/03/2017 0741   CREATININE 1.17 09/24/2019 0810   CREATININE 1.1 12/03/2017 0741      Component Value Date/Time   CALCIUM 9.1 09/24/2019 0810   CALCIUM 9.3 12/03/2017 0741    ALKPHOS 84 09/24/2019 0810   ALKPHOS 66 12/03/2017 0741   AST 16 09/24/2019 0810   AST 16 12/03/2017 0741   ALT 16 09/24/2019 0810   ALT 22 12/03/2017 0741   BILITOT 1.6 (H) 09/24/2019 0810   BILITOT 1.01 12/03/2017 0741      G ROSS AND MICROSCOPIC INFORMATION  Impression and Plan:   74 year old man with:   1. CLL diagnosed in 2017 after presenting with autoimmune hemolytic anemia and adenopathy.    The natural course of this disease was reviewed today with the patient and his wife.  He is asymptomatic at this time and does not require definitive treatment for the time being.  Indication for treatment would include recurrence of his autoimmune hemolytic anemia, painful adenopathy or constitutional symptoms.  Treatment options would include chemotherapy as well as oral targeted therapy were reiterated.  The plan is to repeat imaging studies before the next visit which will be tentatively scheduled for April.  2. Autoimmune hemolytic anemia: Hemoglobin remains stable at this time although slowly declining.  It is possible that he has a degree of hemolysis although adequately compensated.  We will continue to monitor for the time being.    3. Thrombocytopenia: Blood count slightly lower which could be autoimmune etiology as well.  No active bleeding is noted.     4. Follow-up: In 3 months for repeat evaluation.  Sooner if needed.  30 minutes was spent on this encounter.  The time was dedicated to reviewing laboratory data previous imaging studies, discussing his disease status, treatment options as well as management options for the future.  Zola Button, MD 1/12/20219:52 AM

## 2019-12-14 NOTE — Telephone Encounter (Signed)
Scheduled appt per 1/12 los.  Sent a message to HIM pool to get a calendar mailed out.

## 2019-12-17 LAB — MULTIPLE MYELOMA PANEL, SERUM
Albumin SerPl Elph-Mcnc: 4.2 g/dL (ref 2.9–4.4)
Albumin/Glob SerPl: 1.6 (ref 0.7–1.7)
Alpha 1: 0.2 g/dL (ref 0.0–0.4)
Alpha2 Glob SerPl Elph-Mcnc: 0.4 g/dL (ref 0.4–1.0)
B-Globulin SerPl Elph-Mcnc: 0.6 g/dL — ABNORMAL LOW (ref 0.7–1.3)
Gamma Glob SerPl Elph-Mcnc: 1.5 g/dL (ref 0.4–1.8)
Globulin, Total: 2.8 g/dL (ref 2.2–3.9)
IgA: 78 mg/dL (ref 61–437)
IgG (Immunoglobin G), Serum: 1474 mg/dL (ref 603–1613)
IgM (Immunoglobulin M), Srm: 130 mg/dL (ref 15–143)
M Protein SerPl Elph-Mcnc: 0.5 g/dL — ABNORMAL HIGH
Total Protein ELP: 7 g/dL (ref 6.0–8.5)

## 2019-12-23 DIAGNOSIS — C9110 Chronic lymphocytic leukemia of B-cell type not having achieved remission: Secondary | ICD-10-CM | POA: Diagnosis not present

## 2019-12-31 DIAGNOSIS — E1165 Type 2 diabetes mellitus with hyperglycemia: Secondary | ICD-10-CM | POA: Diagnosis not present

## 2019-12-31 DIAGNOSIS — Z7189 Other specified counseling: Secondary | ICD-10-CM | POA: Diagnosis not present

## 2019-12-31 DIAGNOSIS — Z125 Encounter for screening for malignant neoplasm of prostate: Secondary | ICD-10-CM | POA: Diagnosis not present

## 2019-12-31 DIAGNOSIS — Z6824 Body mass index (BMI) 24.0-24.9, adult: Secondary | ICD-10-CM | POA: Diagnosis not present

## 2019-12-31 DIAGNOSIS — Z1211 Encounter for screening for malignant neoplasm of colon: Secondary | ICD-10-CM | POA: Diagnosis not present

## 2019-12-31 DIAGNOSIS — Z79899 Other long term (current) drug therapy: Secondary | ICD-10-CM | POA: Diagnosis not present

## 2019-12-31 DIAGNOSIS — D589 Hereditary hemolytic anemia, unspecified: Secondary | ICD-10-CM | POA: Diagnosis not present

## 2019-12-31 DIAGNOSIS — Z0000 Encounter for general adult medical examination without abnormal findings: Secondary | ICD-10-CM | POA: Diagnosis not present

## 2019-12-31 DIAGNOSIS — E7800 Pure hypercholesterolemia, unspecified: Secondary | ICD-10-CM | POA: Diagnosis not present

## 2019-12-31 DIAGNOSIS — I10 Essential (primary) hypertension: Secondary | ICD-10-CM | POA: Diagnosis not present

## 2019-12-31 DIAGNOSIS — R5383 Other fatigue: Secondary | ICD-10-CM | POA: Diagnosis not present

## 2019-12-31 DIAGNOSIS — Z1339 Encounter for screening examination for other mental health and behavioral disorders: Secondary | ICD-10-CM | POA: Diagnosis not present

## 2019-12-31 DIAGNOSIS — Z1331 Encounter for screening for depression: Secondary | ICD-10-CM | POA: Diagnosis not present

## 2019-12-31 DIAGNOSIS — Z299 Encounter for prophylactic measures, unspecified: Secondary | ICD-10-CM | POA: Diagnosis not present

## 2020-03-07 ENCOUNTER — Other Ambulatory Visit: Payer: Self-pay | Admitting: Oncology

## 2020-03-07 ENCOUNTER — Ambulatory Visit (HOSPITAL_COMMUNITY)
Admission: RE | Admit: 2020-03-07 | Discharge: 2020-03-07 | Disposition: A | Discharge status: Home / Self Care | Payer: PPO | Source: Ambulatory Visit | Attending: Oncology | Admitting: Oncology

## 2020-03-07 ENCOUNTER — Other Ambulatory Visit: Payer: Self-pay

## 2020-03-07 ENCOUNTER — Inpatient Hospital Stay: Payer: PPO | Attending: Oncology

## 2020-03-07 ENCOUNTER — Encounter (HOSPITAL_COMMUNITY): Payer: Self-pay

## 2020-03-07 DIAGNOSIS — R911 Solitary pulmonary nodule: Secondary | ICD-10-CM | POA: Insufficient documentation

## 2020-03-07 DIAGNOSIS — Z888 Allergy status to other drugs, medicaments and biological substances status: Secondary | ICD-10-CM | POA: Insufficient documentation

## 2020-03-07 DIAGNOSIS — Z79899 Other long term (current) drug therapy: Secondary | ICD-10-CM | POA: Insufficient documentation

## 2020-03-07 DIAGNOSIS — D6959 Other secondary thrombocytopenia: Secondary | ICD-10-CM | POA: Diagnosis not present

## 2020-03-07 DIAGNOSIS — C8590 Non-Hodgkin lymphoma, unspecified, unspecified site: Secondary | ICD-10-CM

## 2020-03-07 DIAGNOSIS — R161 Splenomegaly, not elsewhere classified: Secondary | ICD-10-CM | POA: Insufficient documentation

## 2020-03-07 DIAGNOSIS — C9110 Chronic lymphocytic leukemia of B-cell type not having achieved remission: Secondary | ICD-10-CM | POA: Insufficient documentation

## 2020-03-07 DIAGNOSIS — J439 Emphysema, unspecified: Secondary | ICD-10-CM | POA: Diagnosis not present

## 2020-03-07 DIAGNOSIS — N400 Enlarged prostate without lower urinary tract symptoms: Secondary | ICD-10-CM | POA: Insufficient documentation

## 2020-03-07 DIAGNOSIS — D591 Autoimmune hemolytic anemia, unspecified: Secondary | ICD-10-CM | POA: Insufficient documentation

## 2020-03-07 DIAGNOSIS — I700 Atherosclerosis of aorta: Secondary | ICD-10-CM | POA: Diagnosis not present

## 2020-03-07 DIAGNOSIS — D599 Acquired hemolytic anemia, unspecified: Secondary | ICD-10-CM

## 2020-03-07 LAB — CMP (CANCER CENTER ONLY)
ALT: 24 U/L (ref 0–44)
AST: 25 U/L (ref 15–41)
Albumin: 4.3 g/dL (ref 3.5–5.0)
Alkaline Phosphatase: 83 U/L (ref 38–126)
Anion gap: 7 (ref 5–15)
BUN: 23 mg/dL (ref 8–23)
CO2: 23 mmol/L (ref 22–32)
Calcium: 9.2 mg/dL (ref 8.9–10.3)
Chloride: 109 mmol/L (ref 98–111)
Creatinine: 1.15 mg/dL (ref 0.61–1.24)
GFR, Est AFR Am: >60 mL/min (ref >60)
GFR, Estimated: >60 mL/min (ref >60)
Glucose, Bld: 103 mg/dL — ABNORMAL HIGH (ref 70–99)
Potassium: 4.6 mmol/L (ref 3.5–5.1)
Sodium: 139 mmol/L (ref 135–145)
Total Bilirubin: 2.2 mg/dL — ABNORMAL HIGH (ref 0.3–1.2)
Total Protein: 7.7 g/dL (ref 6.5–8.1)

## 2020-03-07 LAB — LACTATE DEHYDROGENASE: LDH: 445 U/L — ABNORMAL HIGH (ref 98–192)

## 2020-03-07 MED ORDER — IOHEXOL 300 MG/ML  SOLN
100.0000 mL | Freq: Once | INTRAMUSCULAR | Status: AC | PRN
  Administered 2020-03-07: 13:00:00 100 mL via INTRAVENOUS

## 2020-03-07 MED ORDER — SODIUM CHLORIDE (PF) 0.9 % IJ SOLN
INTRAMUSCULAR | Status: AC
  Filled 2020-03-07: qty 50

## 2020-03-08 ENCOUNTER — Inpatient Hospital Stay (HOSPITAL_BASED_OUTPATIENT_CLINIC_OR_DEPARTMENT_OTHER): Payer: PPO | Admitting: Oncology

## 2020-03-08 ENCOUNTER — Inpatient Hospital Stay: Payer: PPO

## 2020-03-08 ENCOUNTER — Other Ambulatory Visit: Payer: Self-pay

## 2020-03-08 VITALS — BP 156/65 | HR 70 | Temp 98.3°F | Resp 18 | Wt 180.9 lb

## 2020-03-08 DIAGNOSIS — C8590 Non-Hodgkin lymphoma, unspecified, unspecified site: Secondary | ICD-10-CM

## 2020-03-08 DIAGNOSIS — D599 Acquired hemolytic anemia, unspecified: Secondary | ICD-10-CM

## 2020-03-08 DIAGNOSIS — C9110 Chronic lymphocytic leukemia of B-cell type not having achieved remission: Secondary | ICD-10-CM | POA: Diagnosis not present

## 2020-03-08 LAB — CBC WITH DIFFERENTIAL (CANCER CENTER ONLY)
Abs Immature Granulocytes: 0.12 10*3/uL — ABNORMAL HIGH (ref 0.00–0.07)
Basophils Absolute: 0.1 10*3/uL (ref 0.0–0.1)
Basophils Relative: 1 %
Eosinophils Absolute: 0.2 10*3/uL (ref 0.0–0.5)
Eosinophils Relative: 2 %
HCT: 36.2 % — ABNORMAL LOW (ref 39.0–52.0)
Hemoglobin: 12.3 g/dL — ABNORMAL LOW (ref 13.0–17.0)
Immature Granulocytes: 1 %
Lymphocytes Relative: 44 %
Lymphs Abs: 4.9 10*3/uL — ABNORMAL HIGH (ref 0.7–4.0)
MCH: 35.8 pg — ABNORMAL HIGH (ref 26.0–34.0)
MCHC: 34 g/dL (ref 30.0–36.0)
MCV: 105.2 fL — ABNORMAL HIGH (ref 80.0–100.0)
Monocytes Absolute: 1.3 10*3/uL — ABNORMAL HIGH (ref 0.1–1.0)
Monocytes Relative: 12 %
Neutro Abs: 4.3 10*3/uL (ref 1.7–7.7)
Neutrophils Relative %: 40 %
Platelet Count: 148 10*3/uL — ABNORMAL LOW (ref 150–400)
RBC: 3.44 MIL/uL — ABNORMAL LOW (ref 4.22–5.81)
RDW: 19.3 % — ABNORMAL HIGH (ref 11.5–15.5)
WBC Count: 10.9 10*3/uL — ABNORMAL HIGH (ref 4.0–10.5)
nRBC: 0 % (ref 0.0–0.2)

## 2020-03-08 MED ORDER — PREDNISONE 10 MG PO TABS: ORAL_TABLET | ORAL | 3 refills | Status: DC

## 2020-03-08 NOTE — Progress Notes (Signed)
Hematology and Oncology Follow Up Visit  Richard Evans [id number] 1946-08-23 74 y.o. 03/08/2020 9:57 AM Richard Evans, MDVyas, [name], MD   Principle Diagnosis: 74 year old man with CLL diagnosed in 2017.  He presented with adenopathy as well as autoimmune hemolytic anemia.        Prior Therapy:    Prednisone 70 mg daily started on 06/30/2016. He achieved complete response and prednisone tapered completely off on 09/11/2016. He relapsed in October 2017.  Prednisone 90 mg daily started on 09/30/2016. He achieved complete response on 10/17/2016. He completed prednisone taper on 12/18/2016.  Rituximab at 375 mg/m weekly started on 08/08/2017. He is status post 4 weeks of therapy completed on 08/27/2017.  Prednisone 60 mg daily started on 09/17/2017.  He was tapered off in December 2018 after achieving a complete response.  Current therapy: Active surveillance.    Interim History:  [name] is here for a return evaluation.  Since the last visit, he reports no major changes in his health.  He is reporting more fatigue and tiredness and slight dyspnea on exertion.  He denies any hematochezia, melena or discoloration in his urine.  Denies any cough or wheezing.  His performance status and quality of life remains stable at this time.  He denies any lymphadenopathy or constitutional symptoms.  .           Medications: Reviewed without changes. Current Outpatient Medications  Medication Sig Dispense Refill  . atenolol (TENORMIN) 25 MG tablet Take 25 mg by mouth at bedtime.     . Cholecalciferol (VITAMIN D) 2000 units CAPS Take 1 capsule by mouth daily.    . diphenhydrAMINE (BENADRYL) 25 mg capsule Take 25 mg by mouth every 6 (six) hours as needed.    . folic acid (FOLVITE) 1 MG tablet TAKE ONE TABLET BY MOUTH EVERY DAY 30 tablet 2  . polyethylene glycol powder (CLEARLAX) powder Take 17 g by mouth daily as needed (constipation).    . Tamsulosin HCl (FLOMAX) 0.4 MG CAPS Take 0.4 mg  by mouth at bedtime.      No current facility-administered medications for this visit.     Allergies:  Allergies  Allergen Reactions  . Adhesive [Tape] Itching and Rash    Paper tape or wrapped bandage is preferred      Physical Exam:   Blood pressure (!) 156/65, pulse 70, temperature 98.3 F (36.8 C), temperature source Temporal, resp. rate 18, weight 180 lb 14.4 oz (82.1 kg), SpO2 96 %.      ECOG: 0   General appearance: Alert, awake without any distress. Head: Atraumatic without abnormalities Oropharynx: Without any thrush or ulcers. Eyes: No scleral icterus. Lymph nodes: No lymphadenopathy noted in the cervical, supraclavicular, or axillary nodes Heart:regular rate and rhythm, without any murmurs or gallops.   Lung: Clear to auscultation without any rhonchi, wheezes or dullness to percussion. Abdomin: Soft, nontender without any shifting dullness or ascites. Musculoskeletal: No clubbing or cyanosis. Neurological: No motor or sensory deficits. Skin: No rashes or lesions.         Lab Results: Lab Results  Component Value Date   WBC 11.1 (H) 12/14/2019   HGB 12.0 (L) 12/14/2019   HCT 33.2 (L) 12/14/2019   MCV 104.1 (H) 12/14/2019   PLT 120 (L) 12/14/2019     Chemistry      Component Value Date/Time   NA 139 03/07/2020 1124   NA 141 12/03/2017 0741   K 4.6 03/07/2020 1124   K 4.1  12/03/2017 0741   CL 109 03/07/2020 1124   CO2 23 03/07/2020 1124   CO2 22 12/03/2017 0741   BUN 23 03/07/2020 1124   BUN 22.1 12/03/2017 0741   CREATININE 1.15 03/07/2020 1124   CREATININE 1.1 12/03/2017 0741      Component Value Date/Time   CALCIUM 9.2 03/07/2020 1124   CALCIUM 9.3 12/03/2017 0741   ALKPHOS 83 03/07/2020 1124   ALKPHOS 66 12/03/2017 0741   AST 25 03/07/2020 1124   AST 16 12/03/2017 0741   ALT 24 03/07/2020 1124   ALT 22 12/03/2017 0741   BILITOT 2.2 (H) 03/07/2020 1124   BILITOT 1.01 12/03/2017 0741     IMPRESSION: 1. Interval progression  of mediastinal and bilateral hilar lymphadenopathy compared to the most recent comparison chest CT of 11/18/2017. 2. 2.9 x 2.2 cm right lower lobe pulmonary nodule has progressed in the interval since abdomen/pelvis CT of 01/26/2018 and new since chest CT of 11/18/2017. Parenchymal involvement by lymphoma would be a consideration. Primary bronchogenic neoplasm not excluded. PET-CT may prove helpful to further evaluate. 3. 2.2 cm focus of ill-defined soft tissue attenuation in the central mesentery. This does not appear to be a lymph node, but mesenteric involvement by lymphoma cannot be excluded. Close attention on follow-up recommended. 4. Upper normal lymph nodes in the hepatoduodenal ligament, stable. 5. Mild splenomegaly. 6. Prostatomegaly. 7. Small left groin hernia contains only fat. 8. Aortic Atherosclerosis (ICD10-I70.0) and Emphysema (ICD10-J43.9).  G ROSS AND MICROSCOPIC INFORMATION  Impression and Plan:   74 year old man with:   1. CLL presented with lymphocytosis and autoimmune hemolytic anemia in 2017.     He is status post therapy outlined above and currently on active surveillance.  CT scan on April 6 of 2021 was reviewed and showed no evidence of progressive bulky adenopathy.  No enlargement of the spleen at this time or any indication for treatment.  Treatment options at this point to include systemic chemotherapy, oral targeted therapy versus continued active surveillance.  At this time we opted with active surveillance without any clear indication for treatment.   2. Autoimmune hemolytic anemia: He status post of steroids in the past.  He does have element of active hemolysis but appears to be compensated previously.  His hemoglobin is stable although his LDH is elevating and his bilirubin remains high.  Despite decompensated hemolysis I will start him on short course of prednisone with quick taper to suppress his active hemolysis.  We will start with 40 mg and  taper quickly.  3. Thrombocytopenia: Related to CLL with very mild count at this time.  No active bleeding noted.   4.  Lung nodule: CT scan shows a right lower lobe nodule has progressed slowly over the last few years.  Differential diagnosis including CLL, primary lung neoplasm versus benign etiologies.  I recommended obtaining tissue biopsy at this time to determine defects course of action.  Primary surgical therapy versus radiation would be a reasonable option pending the results of the biopsy.  He will consider this and let me know in the near future.     4. Follow-up: Will be in 3 weeks for MD follow-up.  He will have frequent laboratory testing in between.  30 minutes were dedicated to this visit.  Time was spent on reviewing imaging studies, laboratory data, treatment options and future plan of care discussion.   Zola Button, MD 4/7/20219:57 AM

## 2020-03-09 ENCOUNTER — Telehealth: Payer: Self-pay | Admitting: Oncology

## 2020-03-09 NOTE — Telephone Encounter (Signed)
Scheduled appt per 4/7 los.  Spoke with pt and he is aware of his scheduled appt dates and time.

## 2020-03-22 ENCOUNTER — Inpatient Hospital Stay: Payer: PPO

## 2020-03-22 ENCOUNTER — Other Ambulatory Visit: Payer: Self-pay

## 2020-03-22 ENCOUNTER — Telehealth: Payer: Self-pay

## 2020-03-22 DIAGNOSIS — D599 Acquired hemolytic anemia, unspecified: Secondary | ICD-10-CM

## 2020-03-22 DIAGNOSIS — C9110 Chronic lymphocytic leukemia of B-cell type not having achieved remission: Secondary | ICD-10-CM | POA: Diagnosis not present

## 2020-03-22 DIAGNOSIS — C8590 Non-Hodgkin lymphoma, unspecified, unspecified site: Secondary | ICD-10-CM

## 2020-03-22 LAB — CBC WITH DIFFERENTIAL (CANCER CENTER ONLY)
Abs Immature Granulocytes: 0.12 10*3/uL — ABNORMAL HIGH (ref 0.00–0.07)
Basophils Absolute: 0 10*3/uL (ref 0.0–0.1)
Basophils Relative: 0 %
Eosinophils Absolute: 0.1 10*3/uL (ref 0.0–0.5)
Eosinophils Relative: 1 %
HCT: 40.5 % (ref 39.0–52.0)
Hemoglobin: 13.9 g/dL (ref 13.0–17.0)
Immature Granulocytes: 1 %
Lymphocytes Relative: 34 %
Lymphs Abs: 4.5 10*3/uL — ABNORMAL HIGH (ref 0.7–4.0)
MCH: 33.7 pg (ref 26.0–34.0)
MCHC: 34.3 g/dL (ref 30.0–36.0)
MCV: 98.1 fL (ref 80.0–100.0)
Monocytes Absolute: 0.7 10*3/uL (ref 0.1–1.0)
Monocytes Relative: 5 %
Neutro Abs: 7.8 10*3/uL — ABNORMAL HIGH (ref 1.7–7.7)
Neutrophils Relative %: 59 %
Platelet Count: 112 10*3/uL — ABNORMAL LOW (ref 150–400)
RBC: 4.13 MIL/uL — ABNORMAL LOW (ref 4.22–5.81)
RDW: 15.8 % — ABNORMAL HIGH (ref 11.5–15.5)
WBC Count: 13.2 10*3/uL — ABNORMAL HIGH (ref 4.0–10.5)
nRBC: 0 % (ref 0.0–0.2)

## 2020-03-22 NOTE — Telephone Encounter (Signed)
Called patient and let him know per Dr. Alen Blew hemoglobin is normal. Patient verbalized understanding.

## 2020-03-22 NOTE — Telephone Encounter (Signed)
-----   Message from Tibbett Portela, MD sent at 03/22/2020  8:36 AM EDT ----- Please let him know his hgb is normal.

## 2020-04-04 DIAGNOSIS — Z299 Encounter for prophylactic measures, unspecified: Secondary | ICD-10-CM | POA: Diagnosis not present

## 2020-04-04 DIAGNOSIS — E7800 Pure hypercholesterolemia, unspecified: Secondary | ICD-10-CM | POA: Diagnosis not present

## 2020-04-04 DIAGNOSIS — C9110 Chronic lymphocytic leukemia of B-cell type not having achieved remission: Secondary | ICD-10-CM | POA: Diagnosis not present

## 2020-04-04 DIAGNOSIS — E1165 Type 2 diabetes mellitus with hyperglycemia: Secondary | ICD-10-CM | POA: Diagnosis not present

## 2020-04-04 DIAGNOSIS — I10 Essential (primary) hypertension: Secondary | ICD-10-CM | POA: Diagnosis not present

## 2020-04-19 ENCOUNTER — Telehealth: Payer: Self-pay

## 2020-04-19 ENCOUNTER — Inpatient Hospital Stay: Payer: PPO | Attending: Oncology

## 2020-04-19 ENCOUNTER — Other Ambulatory Visit: Payer: Self-pay

## 2020-04-19 DIAGNOSIS — C9110 Chronic lymphocytic leukemia of B-cell type not having achieved remission: Secondary | ICD-10-CM | POA: Insufficient documentation

## 2020-04-19 DIAGNOSIS — D599 Acquired hemolytic anemia, unspecified: Secondary | ICD-10-CM | POA: Diagnosis not present

## 2020-04-19 DIAGNOSIS — C8590 Non-Hodgkin lymphoma, unspecified, unspecified site: Secondary | ICD-10-CM

## 2020-04-19 LAB — CBC WITH DIFFERENTIAL (CANCER CENTER ONLY)
Abs Immature Granulocytes: 0.19 10*3/uL — ABNORMAL HIGH (ref 0.00–0.07)
Basophils Absolute: 0 10*3/uL (ref 0.0–0.1)
Basophils Relative: 0 %
Eosinophils Absolute: 0.1 10*3/uL (ref 0.0–0.5)
Eosinophils Relative: 1 %
HCT: 41.6 % (ref 39.0–52.0)
Hemoglobin: 14.9 g/dL (ref 13.0–17.0)
Immature Granulocytes: 1 %
Lymphocytes Relative: 43 %
Lymphs Abs: 6.3 10*3/uL — ABNORMAL HIGH (ref 0.7–4.0)
MCH: 33 pg (ref 26.0–34.0)
MCHC: 35.8 g/dL (ref 30.0–36.0)
MCV: 92.2 fL (ref 80.0–100.0)
Monocytes Absolute: 0.7 10*3/uL (ref 0.1–1.0)
Monocytes Relative: 5 %
Neutro Abs: 7.3 10*3/uL (ref 1.7–7.7)
Neutrophils Relative %: 50 %
Platelet Count: 112 10*3/uL — ABNORMAL LOW (ref 150–400)
RBC: 4.51 MIL/uL (ref 4.22–5.81)
RDW: 14.1 % (ref 11.5–15.5)
WBC Count: 14.6 10*3/uL — ABNORMAL HIGH (ref 4.0–10.5)
nRBC: 0 % (ref 0.0–0.2)

## 2020-04-19 NOTE — Telephone Encounter (Signed)
-----   Message from Gulotta Portela, MD sent at 04/19/2020  8:22 AM EDT ----- Please let him know his hgb is normal. No changes for now

## 2020-04-19 NOTE — Telephone Encounter (Signed)
Called patient and let him know his hemoglobin is normal and no changes for now. Patient verbalized understanding.

## 2020-05-30 DIAGNOSIS — Z299 Encounter for prophylactic measures, unspecified: Secondary | ICD-10-CM | POA: Diagnosis not present

## 2020-05-30 DIAGNOSIS — D692 Other nonthrombocytopenic purpura: Secondary | ICD-10-CM | POA: Diagnosis not present

## 2020-05-30 DIAGNOSIS — Z87891 Personal history of nicotine dependence: Secondary | ICD-10-CM | POA: Diagnosis not present

## 2020-05-30 DIAGNOSIS — I10 Essential (primary) hypertension: Secondary | ICD-10-CM | POA: Diagnosis not present

## 2020-05-30 DIAGNOSIS — K5792 Diverticulitis of intestine, part unspecified, without perforation or abscess without bleeding: Secondary | ICD-10-CM | POA: Diagnosis not present

## 2020-06-02 ENCOUNTER — Telehealth: Payer: Self-pay | Admitting: Oncology

## 2020-06-02 NOTE — Telephone Encounter (Signed)
Rescheduled per providers pal, patient has been notified of new date.

## 2020-06-07 ENCOUNTER — Other Ambulatory Visit: Payer: PPO

## 2020-06-07 ENCOUNTER — Ambulatory Visit: Payer: PPO | Admitting: Oncology

## 2020-06-12 ENCOUNTER — Other Ambulatory Visit: Payer: Self-pay

## 2020-06-12 ENCOUNTER — Inpatient Hospital Stay: Payer: PPO | Attending: Oncology

## 2020-06-12 ENCOUNTER — Telehealth: Payer: Self-pay | Admitting: Oncology

## 2020-06-12 ENCOUNTER — Inpatient Hospital Stay (HOSPITAL_BASED_OUTPATIENT_CLINIC_OR_DEPARTMENT_OTHER): Payer: PPO | Admitting: Oncology

## 2020-06-12 VITALS — BP 131/64 | HR 65 | Temp 97.6°F | Resp 18 | Ht 72.0 in | Wt 188.1 lb

## 2020-06-12 DIAGNOSIS — D591 Autoimmune hemolytic anemia, unspecified: Secondary | ICD-10-CM | POA: Insufficient documentation

## 2020-06-12 DIAGNOSIS — N400 Enlarged prostate without lower urinary tract symptoms: Secondary | ICD-10-CM | POA: Insufficient documentation

## 2020-06-12 DIAGNOSIS — C8590 Non-Hodgkin lymphoma, unspecified, unspecified site: Secondary | ICD-10-CM | POA: Diagnosis not present

## 2020-06-12 DIAGNOSIS — J439 Emphysema, unspecified: Secondary | ICD-10-CM | POA: Insufficient documentation

## 2020-06-12 DIAGNOSIS — D696 Thrombocytopenia, unspecified: Secondary | ICD-10-CM | POA: Diagnosis not present

## 2020-06-12 DIAGNOSIS — C9110 Chronic lymphocytic leukemia of B-cell type not having achieved remission: Secondary | ICD-10-CM | POA: Insufficient documentation

## 2020-06-12 DIAGNOSIS — Z888 Allergy status to other drugs, medicaments and biological substances status: Secondary | ICD-10-CM | POA: Diagnosis not present

## 2020-06-12 DIAGNOSIS — Z79899 Other long term (current) drug therapy: Secondary | ICD-10-CM | POA: Insufficient documentation

## 2020-06-12 DIAGNOSIS — R161 Splenomegaly, not elsewhere classified: Secondary | ICD-10-CM | POA: Diagnosis not present

## 2020-06-12 DIAGNOSIS — I700 Atherosclerosis of aorta: Secondary | ICD-10-CM | POA: Insufficient documentation

## 2020-06-12 DIAGNOSIS — R591 Generalized enlarged lymph nodes: Secondary | ICD-10-CM | POA: Diagnosis not present

## 2020-06-12 DIAGNOSIS — R911 Solitary pulmonary nodule: Secondary | ICD-10-CM | POA: Diagnosis not present

## 2020-06-12 DIAGNOSIS — D599 Acquired hemolytic anemia, unspecified: Secondary | ICD-10-CM

## 2020-06-12 LAB — CBC WITH DIFFERENTIAL (CANCER CENTER ONLY)
Abs Immature Granulocytes: 0.1 10*3/uL — ABNORMAL HIGH (ref 0.00–0.07)
Basophils Absolute: 0.1 10*3/uL (ref 0.0–0.1)
Basophils Relative: 1 %
Eosinophils Absolute: 0.2 10*3/uL (ref 0.0–0.5)
Eosinophils Relative: 1 %
HCT: 40.9 % (ref 39.0–52.0)
Hemoglobin: 14.6 g/dL (ref 13.0–17.0)
Immature Granulocytes: 1 %
Lymphocytes Relative: 50 %
Lymphs Abs: 6.3 10*3/uL — ABNORMAL HIGH (ref 0.7–4.0)
MCH: 34.2 pg — ABNORMAL HIGH (ref 26.0–34.0)
MCHC: 35.7 g/dL (ref 30.0–36.0)
MCV: 95.8 fL (ref 80.0–100.0)
Monocytes Absolute: 1.5 10*3/uL — ABNORMAL HIGH (ref 0.1–1.0)
Monocytes Relative: 12 %
Neutro Abs: 4.3 10*3/uL (ref 1.7–7.7)
Neutrophils Relative %: 35 %
Platelet Count: 121 10*3/uL — ABNORMAL LOW (ref 150–400)
RBC: 4.27 MIL/uL (ref 4.22–5.81)
RDW: 16.4 % — ABNORMAL HIGH (ref 11.5–15.5)
WBC Count: 12.3 10*3/uL — ABNORMAL HIGH (ref 4.0–10.5)
nRBC: 0 % (ref 0.0–0.2)

## 2020-06-12 NOTE — Progress Notes (Signed)
Hematology and Oncology Follow Up Visit  Richard Evans [id number] November 12, 1946 74 y.o. 06/12/2020 9:19 AM Richard Evans, MDVyas, Dhruv B, MD   Principle Diagnosis: 74 year old man with CLL presented with autoimmune hemolytic anemia and mild adenopathy in 2017.      Prior Therapy:    Prednisone 70 mg daily started on 06/30/2016. He achieved complete response and prednisone tapered completely off on 09/11/2016. He relapsed in October 2017.  Prednisone 90 mg daily started on 09/30/2016. He achieved complete response on 10/17/2016. He completed prednisone taper on 12/18/2016.  Rituximab at 375 mg/m weekly started on 08/08/2017. He is status post 4 weeks of therapy completed on 08/27/2017.  Prednisone 60 mg daily started on 09/17/2017.  He was tapered off in December 2018 after achieving a complete response.  Current therapy: Active surveillance.    Interim History:  [name] is here for return evaluation.  Since the last visit, he reports no major changes in his health.  He completed a quick prednisone taper in April 2021 without any major complications.  He denies any nausea, vomiting or abdominal pain.  He denies any excessive fatigue or tiredness.  He denies any respiratory complaints including cough, wheezing or hemoptysis.  He remains active and he was able to build a deck by himself outdoors.  .           Medications: Reviewed without changes. Current Outpatient Medications  Medication Sig Dispense Refill  . atenolol (TENORMIN) 25 MG tablet Take 25 mg by mouth at bedtime.     . Cholecalciferol (VITAMIN D) 2000 units CAPS Take 1 capsule by mouth daily.    . diphenhydrAMINE (BENADRYL) 25 mg capsule Take 25 mg by mouth every 6 (six) hours as needed.    . folic acid (FOLVITE) 1 MG tablet TAKE ONE TABLET BY MOUTH EVERY DAY 30 tablet 2  . polyethylene glycol powder (CLEARLAX) powder Take 17 g by mouth daily as needed (constipation).    . predniSONE (DELTASONE) 10 MG tablet Take  4 tablets daily with food. 120 tablet 3  . Tamsulosin HCl (FLOMAX) 0.4 MG CAPS Take 0.4 mg by mouth at bedtime.      No current facility-administered medications for this visit.     Allergies:  Allergies  Allergen Reactions  . Adhesive [Tape] Itching and Rash    Paper tape or wrapped bandage is preferred      Physical Exam:   Blood pressure 131/64, pulse 65, temperature 97.6 F (36.4 C), temperature source Temporal, resp. rate 18, height 6' (1.829 m), weight 188 lb 1.6 oz (85.3 kg), SpO2 92 %.       ECOG: 0    General appearance: Comfortable appearing without any discomfort Head: Normocephalic without any trauma Oropharynx: Mucous membranes are moist and pink without any thrush or ulcers. Eyes: Pupils are equal and round reactive to light. Lymph nodes: No cervical, supraclavicular, inguinal or axillary lymphadenopathy.   Heart:regular rate and rhythm.  S1 and S2 without leg edema. Lung: Clear without any rhonchi or wheezes.  No dullness to percussion. Abdomin: Soft, nontender, nondistended with good bowel sounds.  No hepatosplenomegaly. Musculoskeletal: No joint deformity or effusion.  Full range of motion noted. Neurological: No deficits noted on motor, sensory and deep tendon reflex exam. Skin: No petechial rash or dryness.  Appeared moist.           Lab Results: Lab Results  Component Value Date   WBC 14.6 (H) 04/19/2020   HGB 14.9 04/19/2020  HCT 41.6 04/19/2020   MCV 92.2 04/19/2020   PLT 112 (L) 04/19/2020     Chemistry      Component Value Date/Time   NA 139 03/07/2020 1124   NA 141 12/03/2017 0741   K 4.6 03/07/2020 1124   K 4.1 12/03/2017 0741   CL 109 03/07/2020 1124   CO2 23 03/07/2020 1124   CO2 22 12/03/2017 0741   BUN 23 03/07/2020 1124   BUN 22.1 12/03/2017 0741   CREATININE 1.15 03/07/2020 1124   CREATININE 1.1 12/03/2017 0741      Component Value Date/Time   CALCIUM 9.2 03/07/2020 1124   CALCIUM 9.3 12/03/2017 0741    ALKPHOS 83 03/07/2020 1124   ALKPHOS 66 12/03/2017 0741   AST 25 03/07/2020 1124   AST 16 12/03/2017 0741   ALT 24 03/07/2020 1124   ALT 22 12/03/2017 0741   BILITOT 2.2 (H) 03/07/2020 1124   BILITOT 1.01 12/03/2017 0741     IMPRESSION: 1. Interval progression of mediastinal and bilateral hilar lymphadenopathy compared to the most recent comparison chest CT of 11/18/2017. 2. 2.9 x 2.2 cm right lower lobe pulmonary nodule has progressed in the interval since abdomen/pelvis CT of 01/26/2018 and new since chest CT of 11/18/2017. Parenchymal involvement by lymphoma would be a consideration. Primary bronchogenic neoplasm not excluded. PET-CT may prove helpful to further evaluate. 3. 2.2 cm focus of ill-defined soft tissue attenuation in the central mesentery. This does not appear to be a lymph node, but mesenteric involvement by lymphoma cannot be excluded. Close attention on follow-up recommended. 4. Upper normal lymph nodes in the hepatoduodenal ligament, stable. 5. Mild splenomegaly. 6. Prostatomegaly. 7. Small left groin hernia contains only fat. 8. Aortic Atherosclerosis (ICD10-I70.0) and Emphysema (ICD10-J43.9).  G ROSS AND MICROSCOPIC INFORMATION  Impression and Plan:   74 year old man with:   1. CLL diagnosed in 2017.  He was found to have autoimmune hemolytic anemia and lymphadenopathy.       The natural course of this disease was reviewed indication for treatment and treatment options were discussed.  These would include systemic chemotherapy, combination of chemotherapy with immunotherapy as well as oral targeted therapy with ibrutinib.  He continues to be resistant to treatment at this time unless absolutely needed and indication for treatments were reiterated.  He has been include painful adenopathy as well as bone marrow failure among others.   At this time we have opted to continue with active surveillance based on his wishes.   2. Autoimmune hemolytic  anemia: He is status post therapy outlined above and currently on observation.  Hemoglobin has been relatively stable.  His hemoglobin today continues to be close to normal range.    3. Thrombocytopenia: Likely autoimmune in nature.  Platelet count improved without any need for any additional treatment.   4.  Right lower lobe lung nodule noted on CT scan on March 07, 2020: The differential diagnosis was reviewed including primary lung neoplasm versus related to CLL.  He continues to refuse biopsy at this time.  I recommended repeat imaging studies in 6 months which she will consider.     5. Follow-up: Will be in 3 months for repeat follow-up.  30 minutes were spent on this encounter.  The time was dedicated to updating his disease status, discussing imaging studies, reviewing laboratory data and future plan of care discussion.  Zola Button, MD 7/12/20219:19 AM

## 2020-06-12 NOTE — Telephone Encounter (Signed)
Scheduled per los. Gave avs and calendar  

## 2020-06-26 DIAGNOSIS — I10 Essential (primary) hypertension: Secondary | ICD-10-CM | POA: Diagnosis not present

## 2020-06-26 DIAGNOSIS — K5730 Diverticulosis of large intestine without perforation or abscess without bleeding: Secondary | ICD-10-CM | POA: Diagnosis not present

## 2020-06-26 DIAGNOSIS — C9110 Chronic lymphocytic leukemia of B-cell type not having achieved remission: Secondary | ICD-10-CM | POA: Diagnosis not present

## 2020-06-26 DIAGNOSIS — Z299 Encounter for prophylactic measures, unspecified: Secondary | ICD-10-CM | POA: Diagnosis not present

## 2020-06-26 DIAGNOSIS — J432 Centrilobular emphysema: Secondary | ICD-10-CM | POA: Diagnosis not present

## 2020-06-26 DIAGNOSIS — K5792 Diverticulitis of intestine, part unspecified, without perforation or abscess without bleeding: Secondary | ICD-10-CM | POA: Diagnosis not present

## 2020-07-11 DIAGNOSIS — J432 Centrilobular emphysema: Secondary | ICD-10-CM | POA: Diagnosis not present

## 2020-07-11 DIAGNOSIS — Z299 Encounter for prophylactic measures, unspecified: Secondary | ICD-10-CM | POA: Diagnosis not present

## 2020-07-11 DIAGNOSIS — I10 Essential (primary) hypertension: Secondary | ICD-10-CM | POA: Diagnosis not present

## 2020-07-11 DIAGNOSIS — E1165 Type 2 diabetes mellitus with hyperglycemia: Secondary | ICD-10-CM | POA: Diagnosis not present

## 2020-07-11 DIAGNOSIS — E7800 Pure hypercholesterolemia, unspecified: Secondary | ICD-10-CM | POA: Diagnosis not present

## 2020-07-26 ENCOUNTER — Other Ambulatory Visit: Payer: Self-pay | Admitting: Oncology

## 2020-08-17 DIAGNOSIS — Z299 Encounter for prophylactic measures, unspecified: Secondary | ICD-10-CM | POA: Diagnosis not present

## 2020-08-17 DIAGNOSIS — C9110 Chronic lymphocytic leukemia of B-cell type not having achieved remission: Secondary | ICD-10-CM | POA: Diagnosis not present

## 2020-08-17 DIAGNOSIS — J432 Centrilobular emphysema: Secondary | ICD-10-CM | POA: Diagnosis not present

## 2020-08-17 DIAGNOSIS — D589 Hereditary hemolytic anemia, unspecified: Secondary | ICD-10-CM | POA: Diagnosis not present

## 2020-08-17 DIAGNOSIS — K5792 Diverticulitis of intestine, part unspecified, without perforation or abscess without bleeding: Secondary | ICD-10-CM | POA: Diagnosis not present

## 2020-09-04 ENCOUNTER — Telehealth: Payer: Self-pay | Admitting: Pulmonary Disease

## 2020-09-04 DIAGNOSIS — R911 Solitary pulmonary nodule: Secondary | ICD-10-CM

## 2020-09-04 NOTE — Telephone Encounter (Signed)
Last seen by me October 2020. Last seen by Dr. Alen Blew 06/2020, known to have right lower lobe nodule which is gradually increasing in size. He refused biopsy in the past  -Please have him schedule CT chest without contrast and office visit with either Dr. Melvyn Novas in [place] if available or with me ASAP after CT

## 2020-09-04 NOTE — Telephone Encounter (Signed)
Richard Evans is aware that PCC's will contact patient to schedule CT. Richard Evans voiced her understanding and had no further questions.

## 2020-09-04 NOTE — Telephone Encounter (Signed)
Patient's spouse, Juliann Pulse [facility]) is aware of below recommendations and voiced her understanding.  Juliann Pulse prefers to see Dr. Elsworth Soho only. Offered appt for 09/12/2020. Juliann Pulse declined as patient has another appointment at the same time. Patient has been scheduled for 10/02/2020 at 10:45a, as this was first available.  CT has been ordered. Nothing further needed.  Will route to Dr. Elsworth Soho as an Juluis Rainier.

## 2020-09-04 NOTE — Telephone Encounter (Signed)
Spoke with pt's spouse with verbal permission from the pt  Pt c/o increased SOB, cough with blood tinged sputum and fatigue x 3 wks  She states that he coughs up the bloody sputum about 3 x per day  Not having any CP, wheezing, f/c/s  He has NOT had his covid vaccine  Dr Elsworth Soho, please advise, thanks!

## 2020-09-07 DIAGNOSIS — I10 Essential (primary) hypertension: Secondary | ICD-10-CM | POA: Diagnosis not present

## 2020-09-07 DIAGNOSIS — Z299 Encounter for prophylactic measures, unspecified: Secondary | ICD-10-CM | POA: Diagnosis not present

## 2020-09-07 DIAGNOSIS — D589 Hereditary hemolytic anemia, unspecified: Secondary | ICD-10-CM | POA: Diagnosis not present

## 2020-09-07 DIAGNOSIS — D539 Nutritional anemia, unspecified: Secondary | ICD-10-CM | POA: Diagnosis not present

## 2020-09-07 DIAGNOSIS — J432 Centrilobular emphysema: Secondary | ICD-10-CM | POA: Diagnosis not present

## 2020-09-07 DIAGNOSIS — Z79899 Other long term (current) drug therapy: Secondary | ICD-10-CM | POA: Diagnosis not present

## 2020-09-11 ENCOUNTER — Other Ambulatory Visit: Payer: Self-pay

## 2020-09-11 ENCOUNTER — Ambulatory Visit (HOSPITAL_COMMUNITY)
Admission: RE | Admit: 2020-09-11 | Discharge: 2020-09-11 | Disposition: A | Discharge status: Home / Self Care | Payer: PPO | Source: Ambulatory Visit | Attending: Pulmonary Disease | Admitting: Pulmonary Disease

## 2020-09-11 DIAGNOSIS — J432 Centrilobular emphysema: Secondary | ICD-10-CM | POA: Diagnosis not present

## 2020-09-11 DIAGNOSIS — R911 Solitary pulmonary nodule: Secondary | ICD-10-CM | POA: Insufficient documentation

## 2020-09-11 DIAGNOSIS — J90 Pleural effusion, not elsewhere classified: Secondary | ICD-10-CM | POA: Diagnosis not present

## 2020-09-11 DIAGNOSIS — I2510 Atherosclerotic heart disease of native coronary artery without angina pectoris: Secondary | ICD-10-CM | POA: Diagnosis not present

## 2020-09-11 DIAGNOSIS — I700 Atherosclerosis of aorta: Secondary | ICD-10-CM | POA: Diagnosis not present

## 2020-09-12 ENCOUNTER — Telehealth: Payer: Self-pay | Admitting: Pulmonary Disease

## 2020-09-12 ENCOUNTER — Encounter: Payer: Self-pay | Admitting: Pulmonary Disease

## 2020-09-12 ENCOUNTER — Ambulatory Visit: Payer: PPO | Admitting: Pulmonary Disease

## 2020-09-12 ENCOUNTER — Other Ambulatory Visit: Payer: Self-pay

## 2020-09-12 ENCOUNTER — Inpatient Hospital Stay: Payer: PPO

## 2020-09-12 ENCOUNTER — Inpatient Hospital Stay: Payer: PPO | Admitting: Oncology

## 2020-09-12 DIAGNOSIS — J9611 Chronic respiratory failure with hypoxia: Secondary | ICD-10-CM | POA: Insufficient documentation

## 2020-09-12 DIAGNOSIS — R918 Other nonspecific abnormal finding of lung field: Secondary | ICD-10-CM | POA: Diagnosis not present

## 2020-09-12 DIAGNOSIS — J432 Centrilobular emphysema: Secondary | ICD-10-CM | POA: Diagnosis not present

## 2020-09-12 DIAGNOSIS — C3491 Malignant neoplasm of unspecified part of right bronchus or lung: Secondary | ICD-10-CM | POA: Insufficient documentation

## 2020-09-12 MED ORDER — ANORO ELLIPTA 62.5-25 MCG/INH IN AEPB: 1.0000 | INHALATION_SPRAY | Freq: Every day | RESPIRATORY_TRACT | 0 refills | Status: DC

## 2020-09-12 MED ORDER — TRAMADOL HCL 50 MG PO TABS: 50.0000 mg | ORAL_TABLET | Freq: Three times a day (TID) | ORAL | 0 refills | Status: DC | PRN

## 2020-09-12 NOTE — Addendum Note (Signed)
Addended by: Coralie Keens on: 09/12/2020 05:32 PM   Modules accepted: Orders

## 2020-09-12 NOTE — Patient Instructions (Signed)
Schedule PET scan  CT guided biopsy of Right lung mass  Start on oxygen  Trial of Anoro once daily  OK to take tylenol / advil 400 mg as needed for pain

## 2020-09-12 NOTE — Telephone Encounter (Signed)
Tramadol 50 q8h prn pain # 15

## 2020-09-12 NOTE — Assessment & Plan Note (Signed)
He will qualify for oxygen based in a saturation of 84% today.  He improved to 91% of 2 L. We will send prescription to DME for portable concentrator, preferably one of the DME's in [place] and reassess him in 2 weeks

## 2020-09-12 NOTE — Telephone Encounter (Signed)
Richard Evans daughter checking status of oxygen order. Richard Evans phone number is [phone].

## 2020-09-12 NOTE — Addendum Note (Signed)
Addended by: Vanessa Barbara on: 09/12/2020 05:02 PM   Modules accepted: Orders

## 2020-09-12 NOTE — Telephone Encounter (Signed)
Called and spoke with patient as he does not have a DPR on file.  He gave permission to speak with his wife and his daughters.  [name] is the daughter I spoke with on the phone.  I advised them that they would need to complete a DPR form giving [place] permission to speak with them on his next office visit.  They verbalized understanding.  Advised that the order for 2L of oxygen was put in, they want to use [employer].  Advised I would call and get an update on delivery of oxygen.  He has tried using Tylenol for pain and was advised not to use Advil as it could cause bleeding in his lung and he had been coughing up blood.  His wife said that Dr. Elsworth Soho said to call if he needed something stronger in the past oxycodone has helped his pain.  Dr. Elsworth Soho, Please advise on what patient can take for continued pain.  Patient's wife states that oxycodone has worked in the past.  They use [employer].  Thank you.

## 2020-09-12 NOTE — Telephone Encounter (Signed)
Closing as this is a duplicate message, has already been sent to Dr. Elsworth Soho.

## 2020-09-12 NOTE — Assessment & Plan Note (Addendum)
This is more likely to be primary lung malignancy rather than lymphoma Difficult to assess whether mediastinal lymphadenopathy is related to lung mass or to his primary lymphoma.  Small right effusion is likely related to malignancy spread to the pleura.  We will obtain PET scan. We will also proceed with CT-guided biopsy irrespective PET scan since this is enlarging mass.  If possible fluid can also be obtained for sampling at the same time I discussed risks and benefits of the procedure including that of bleeding and small chance of lung puncture requiring chest tube.  He is willing to proceed It is unfortunate that he had refused a biopsy on his last visit with oncology and unfortunately the mass has increased even within that time span of 6 months   Hemoptysis may be related to endobronchial spread of cancer likely at the right hilar level.  While we can pursue bronchoscopy as the primary diagnostic approach, EBUS of lymphadenopathy may not be helpful if this shows lymphoma.  Hence I would biopsy lung mass as the primary approach

## 2020-09-12 NOTE — Addendum Note (Signed)
Addended byCoralie Keens on: 09/12/2020 10:50 AM   Modules accepted: Orders

## 2020-09-12 NOTE — Progress Notes (Signed)
Subjective:    Patient ID: Richard Evans, male    DOB: 10-Nov-1946, 74 y.o.   MRN: [id number]  HPI  74 yo ex heavy smoker seen 09/02/2019 for  abnormal chest x-ray showing interstitial prominence which was similar to his prior x-ray from 2017 He was seen in 2017 for hemoptysis and abnormal CT showing centrilobular emphysema  PMH -  06/2016 CLL -when he presented with lymphocytosis, lymphadenopathy and autoimmune hemolytic anemia He was treated with high-dose steroids in 2017 and then Rituxan in 2018  He smoked about a pack per day until 2010 when he quit-about 50-pack-years.  He presents for follow-up again after a year.  After his last visit he was referred to oncology.  He underwent repeat CT chest and abdomen in 03/2020 which showed new lung mass right lower lobe about 2 cm with mediastinal and other diffuse lymphadenopathy.  He was advised lung biopsy but for some reason he refused.  He tells me today that he would have like my opinion about lung biopsy before proceeding.  He called our office complaining of hemoptysis.  Today he reports hemoptysis for 3 to 4 weeks, his wife states that he has been hiding this from other family members.  He reports shortness of breath after walking from one room to the other and on his home pulse oximeter has seen his saturation dropped as low as 70%. He was 84% walking into the office today and increased to 91% on 2 L oxygen. He also reports right pleuritic chest pain that is radiating to his right shoulder for which she has been taking Advil he is not someone who takes medications regularly  Labs from PCP was reviewed which shows platelet count of 140 3K, mild leukocytosis at baseline, hemoglobin 14.9, normal being made except for mild hyperkalemia five-point  Significant tests/ events reviewed  CT chest WO contrast 09/2020 >> Enlarging right lower lobe mass with multiple new right lung nodules.  Mediastinal, hilar and upper abdominal adenopathy is  grossly stable from 03/07/2020 in this patient with a history of CLL.  New small bilateral pleural effusions, partially loculated and slightly larger on the right    Spirometry 12/2016 - no evidence of airway obstruction with FEV1 of 85% and FVC of 81%   CT imaging10/2017showed underlying centrilobular emphysema and right upper lobe airspace disease which was new when compared to his CT from 06/2016 .  CT chest 11/2017 Stable to minimal progression of mild mediastinal and hilar lymphadenopathy Stable to slight decrease in size of the soft tissue lesion adjacent to the distal duodenum.   Past Medical History:  Diagnosis Date  . Hemolytic anemia ([place])   . History of epistaxis    [facility]  . Hypertension   . Pneumonia   . Vocal cord nodule    benign      Review of Systems neg for any significant sore throat, dysphagia, itching, sneezing, nasal congestion or excess/ purulent secretions, fever, chills, sweats, unintended wt loss,  orthopnea pnd or change in chronic leg swelling. Also denies presyncope, palpitations, heartburn, abdominal pain, nausea, vomiting, diarrhea or change in bowel or urinary habits, dysuria,hematuria, rash, arthralgias, visual complaints, headache, numbness weakness or ataxia.     Objective:   Physical Exam  Gen. Pleasant, thin man, in no distress, normal affect ENT - no pallor,icterus, no post nasal drip Neck: No JVD, no thyromegaly, no carotid bruits Lungs: no use of accessory muscles, no dullness to percussion,  decreased BL  without rales or rhonchi  Cardiovascular: Rhythm regular, heart sounds  normal, no murmurs or gallops, no peripheral edema Abdomen: soft and non-tender, no hepatosplenomegaly, BS normal. Musculoskeletal: No deformities, no cyanosis or clubbing Neuro:  alert, non focal       Assessment & Plan:

## 2020-09-12 NOTE — Telephone Encounter (Signed)
Spoke with [name] and notified of response per Dr Elsworth Soho  She verbalized understanding  Rx was called to pharm

## 2020-09-12 NOTE — Telephone Encounter (Signed)
Called [employer] in [place] and reached a busy signal 4 different times.  Called [employer] in [place], left a VM for [place] checking to see if patient will receive his oxygen today.

## 2020-09-12 NOTE — Assessment & Plan Note (Signed)
Trial of Anoro

## 2020-09-13 DIAGNOSIS — R918 Other nonspecific abnormal finding of lung field: Secondary | ICD-10-CM | POA: Diagnosis not present

## 2020-09-13 DIAGNOSIS — J9611 Chronic respiratory failure with hypoxia: Secondary | ICD-10-CM | POA: Diagnosis not present

## 2020-09-13 NOTE — Telephone Encounter (Signed)
PCCs, please see pt's mychart message and advise:   To: [facility] POOL    From: [place]: 09/13/2020 9:03 AM     *-*-*This message was handled on 09/13/2020 12:34 PM by Honestii Marton P*-*-*  Good Morning,   I was able to get my medication last night. I just wanted to check on the status of the oxygen orders.

## 2020-09-13 NOTE — Telephone Encounter (Signed)
Dr. Elsworth Soho, please see pt's mychart message and advise:  To: [facility] POOL    From: [place]: 09/13/2020 3:48 PM     *-*-*This message was handled on 09/13/2020 4:23 PM by Sada Mazzoni P*-*-*  Thank you. It was delivered around 3pm today.   I wanted to let Dr Bari Mantis know that I started the Tramadol that he he called in last night and it is not touching the pain. I would like some advice on what I should do next, because the pain is getting unbearable at this time.      He did receive O2 today at 3pm as stated above.

## 2020-09-13 NOTE — Telephone Encounter (Signed)
Pt has responded that he has received O2.  Looks like he is still needing to know about meds.  Will route back to triage.

## 2020-09-15 ENCOUNTER — Ambulatory Visit (HOSPITAL_COMMUNITY): Payer: PPO

## 2020-09-15 ENCOUNTER — Telehealth: Payer: Self-pay | Admitting: Pulmonary Disease

## 2020-09-15 MED ORDER — AMOXICILLIN-POT CLAVULANATE 875-125 MG PO TABS: 1.0000 | ORAL_TABLET | Freq: Two times a day (BID) | ORAL | 0 refills | Status: DC

## 2020-09-15 MED ORDER — TRAMADOL HCL 50 MG PO TABS: 50.0000 mg | ORAL_TABLET | Freq: Three times a day (TID) | ORAL | 0 refills | Status: DC | PRN

## 2020-09-15 NOTE — Telephone Encounter (Signed)
Called and spoke with Patient, and Patient's Wife, Juliann Pulse.  Juliann Pulse stated Patient's cough sounds tighter, but he still has congestion.  Juliann Pulse stated Patient is using Anoro as prescribed 09/12/20. Juliann Pulse stated Patient's sats dropped to 77% on 2L , while bathing last night.  Patient was very winded, and returned to his recliner.  Patient's son in law works with EMS, and increased O2 to 3 liters, until Patient HR decreased, sats increased, and Patient was relaxed.  Patient stated he is having a productive cough, with foamy, clear, sputum, with some blood tint specks. Patient stated he will run out of Tramadol this weekend, and is concerned if Dr Elsworth Soho would like to continue him taking Tramadol.  Patient stated Tramadol helps some, but not much.  Patient stated he has been using a heating pad to help with pain. Juliann Pulse stated she felt Patient needed something stronger. Patient stated he is needing portable O2 for appointments, because the tanks provided by [employer] are not enough.  Patient stated tanks run out of O2 quickly. Patient is requesting a POC. Per O2 order placed, Patient was to have evaluation and titrate for best fit POC or portable system. [place], but was sent to VM.  LM for someone to return call to follow up on POC request.  Message routed to Dr Elsworth Soho to advise on cough, congestion, and Tramadol refill

## 2020-09-15 NOTE — Telephone Encounter (Signed)
Spoke with Juliann Pulse and notified of response per RA  She verbalized understanding  Rxs called to [facility] drug  Will forward to [facility] to see if biopsy and PET can be moved up

## 2020-09-15 NOTE — Telephone Encounter (Signed)
Called and spoke with Suanne Marker at [facility] in regards to call about POC and she stated that she called and spoke with pt's daughter about pt's sats dropping in the 70s. Suanne Marker stated that she told his daughter if he was dropping into the 61s, a POC would not be suitable for him as he needs continuous flow. Suanne Marker said that they took 6 small C gas tanks to pt's house with a regulator and a bag. Nothing further needed.

## 2020-09-15 NOTE — Telephone Encounter (Signed)
Okay to increase oxygen to 3 L, maintain saturation 88% and above  Please send refill on tramadol # 30 tabs Okay to take extra strength Tylenol in addition 500 every 6 hours as needed  Can you please call IR to expedite CT-guided biopsy Can we also expedite PET scan please  Prescription for Augmentin 875 twice daily x 1 week He may have to go to the emergency room if worse

## 2020-09-15 NOTE — Telephone Encounter (Signed)
Pt called office 10/15 and the tramadol was discussed during that call. Encounter being closed.

## 2020-09-15 NOTE — Telephone Encounter (Signed)
Pt's daughter is also stating that he is going to need a refill of Tramadol before the weekend - he is about out - also states that he is having nodules pop up on temple and base of neck, one on right upper arm. All three the same size and feel - went downhill tremendously since his Tuesday appt - [name] (daughter) will be there later and states that mother is having a hard time with all of this - would prefer to get a verbal consent to talk to Reymundo Poll - Pt CB# [phone] -  Possibly send DPR via MyChart for pt to fill out??

## 2020-09-18 ENCOUNTER — Other Ambulatory Visit: Payer: Self-pay | Admitting: Pulmonary Disease

## 2020-09-18 ENCOUNTER — Telehealth: Payer: Self-pay | Admitting: Pulmonary Disease

## 2020-09-18 DIAGNOSIS — J9611 Chronic respiratory failure with hypoxia: Secondary | ICD-10-CM

## 2020-09-18 NOTE — Telephone Encounter (Addendum)
PET moved to [facility] 10/19 @ 7, check in by 6:30 AM, NPO after midnight- pt aware.    Per Sherry's note on the biopsy, she received a VM from [place] stating order is fine for the biopsy, but will not be put into review until pt has PET.

## 2020-09-18 NOTE — Telephone Encounter (Signed)
Lm for patient's spouse, Juliann Pulse [facility]).

## 2020-09-18 NOTE — Telephone Encounter (Signed)
Working on this one.  

## 2020-09-18 NOTE — Telephone Encounter (Signed)
Pt returning missed call. [phone]

## 2020-09-18 NOTE — Telephone Encounter (Signed)
Spoke with the pt's spouse, Richard Evans  She states o2 making pt's nasal passages dry  She wants to have [place] send order for humidity for o2  Please advise if this is okay, thanks!

## 2020-09-19 ENCOUNTER — Inpatient Hospital Stay: Payer: PPO | Attending: Oncology

## 2020-09-19 ENCOUNTER — Encounter (HOSPITAL_COMMUNITY)
Admission: RE | Admit: 2020-09-19 | Discharge: 2020-09-19 | Disposition: A | Discharge status: Home / Self Care | Payer: PPO | Source: Ambulatory Visit | Attending: Pulmonary Disease | Admitting: Pulmonary Disease

## 2020-09-19 ENCOUNTER — Inpatient Hospital Stay (HOSPITAL_BASED_OUTPATIENT_CLINIC_OR_DEPARTMENT_OTHER): Payer: PPO | Admitting: Oncology

## 2020-09-19 ENCOUNTER — Other Ambulatory Visit: Payer: Self-pay

## 2020-09-19 VITALS — BP 122/72 | HR 67 | Temp 96.9°F | Resp 18 | Ht 72.0 in | Wt 185.2 lb

## 2020-09-19 DIAGNOSIS — R0602 Shortness of breath: Secondary | ICD-10-CM | POA: Diagnosis not present

## 2020-09-19 DIAGNOSIS — R042 Hemoptysis: Secondary | ICD-10-CM | POA: Diagnosis not present

## 2020-09-19 DIAGNOSIS — C8590 Non-Hodgkin lymphoma, unspecified, unspecified site: Secondary | ICD-10-CM | POA: Diagnosis not present

## 2020-09-19 DIAGNOSIS — R911 Solitary pulmonary nodule: Secondary | ICD-10-CM | POA: Diagnosis not present

## 2020-09-19 DIAGNOSIS — Z79899 Other long term (current) drug therapy: Secondary | ICD-10-CM | POA: Diagnosis not present

## 2020-09-19 DIAGNOSIS — R918 Other nonspecific abnormal finding of lung field: Secondary | ICD-10-CM | POA: Diagnosis not present

## 2020-09-19 DIAGNOSIS — J439 Emphysema, unspecified: Secondary | ICD-10-CM | POA: Diagnosis not present

## 2020-09-19 DIAGNOSIS — R0609 Other forms of dyspnea: Secondary | ICD-10-CM | POA: Diagnosis not present

## 2020-09-19 DIAGNOSIS — Z888 Allergy status to other drugs, medicaments and biological substances status: Secondary | ICD-10-CM | POA: Insufficient documentation

## 2020-09-19 DIAGNOSIS — I700 Atherosclerosis of aorta: Secondary | ICD-10-CM | POA: Insufficient documentation

## 2020-09-19 DIAGNOSIS — D591 Autoimmune hemolytic anemia, unspecified: Secondary | ICD-10-CM | POA: Diagnosis not present

## 2020-09-19 DIAGNOSIS — D599 Acquired hemolytic anemia, unspecified: Secondary | ICD-10-CM

## 2020-09-19 DIAGNOSIS — C9110 Chronic lymphocytic leukemia of B-cell type not having achieved remission: Secondary | ICD-10-CM | POA: Diagnosis not present

## 2020-09-19 DIAGNOSIS — R590 Localized enlarged lymph nodes: Secondary | ICD-10-CM | POA: Diagnosis not present

## 2020-09-19 DIAGNOSIS — K4090 Unilateral inguinal hernia, without obstruction or gangrene, not specified as recurrent: Secondary | ICD-10-CM | POA: Insufficient documentation

## 2020-09-19 DIAGNOSIS — D696 Thrombocytopenia, unspecified: Secondary | ICD-10-CM | POA: Insufficient documentation

## 2020-09-19 LAB — CBC WITH DIFFERENTIAL (CANCER CENTER ONLY)
Abs Immature Granulocytes: 0.07 10*3/uL (ref 0.00–0.07)
Basophils Absolute: 0.1 10*3/uL (ref 0.0–0.1)
Basophils Relative: 1 %
Eosinophils Absolute: 0.2 10*3/uL (ref 0.0–0.5)
Eosinophils Relative: 2 %
HCT: 41.4 % (ref 39.0–52.0)
Hemoglobin: 14.2 g/dL (ref 13.0–17.0)
Immature Granulocytes: 1 %
Lymphocytes Relative: 35 %
Lymphs Abs: 3.9 10*3/uL (ref 0.7–4.0)
MCH: 31.3 pg (ref 26.0–34.0)
MCHC: 34.3 g/dL (ref 30.0–36.0)
MCV: 91.4 fL (ref 80.0–100.0)
Monocytes Absolute: 1.2 10*3/uL — ABNORMAL HIGH (ref 0.1–1.0)
Monocytes Relative: 11 %
Neutro Abs: 5.8 10*3/uL (ref 1.7–7.7)
Neutrophils Relative %: 50 %
Platelet Count: 146 10*3/uL — ABNORMAL LOW (ref 150–400)
RBC: 4.53 MIL/uL (ref 4.22–5.81)
RDW: 15.4 % (ref 11.5–15.5)
WBC Count: 11.2 10*3/uL — ABNORMAL HIGH (ref 4.0–10.5)
nRBC: 0 % (ref 0.0–0.2)

## 2020-09-19 LAB — GLUCOSE, CAPILLARY: Glucose-Capillary: 144 mg/dL — ABNORMAL HIGH (ref 70–99)

## 2020-09-19 IMAGING — PT NM PET TUM IMG INITIAL (PI) SKULL BASE T - THIGH
1 series · 14 of 14 positions shown · non-contrast
Comparison: 09/11/2020 and multiple prior studies

CLINICAL DATA: Initial treatment strategy for RIGHT lung mass.

EXAM:
NUCLEAR MEDICINE PET SKULL BASE TO THIGH
TECHNIQUE: 9.4 mCi F-18 FDG was injected intravenously. Full-ring PET imaging
was performed from the skull base to thigh after the radiotracer. CT
data was obtained and used for attenuation correction and anatomic
localization.
Fasting blood glucose: 144 mg/dl

[Series 1094: results mm oncology reading · 1.0mm · 0.50mm/px · 14 of 14 slices shown]
[im 1/14]
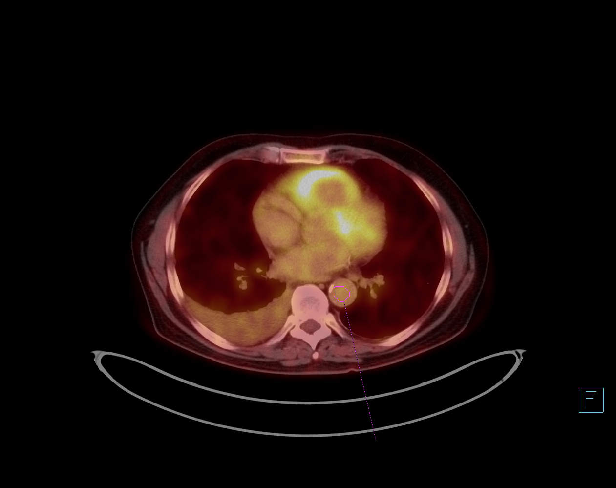
[im 2/14]
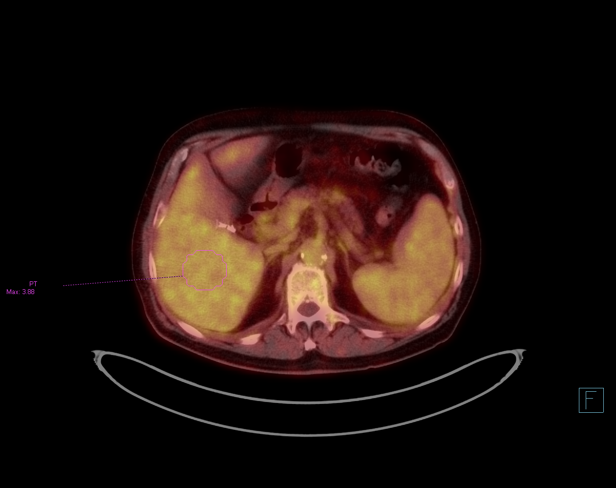
[im 3/14]
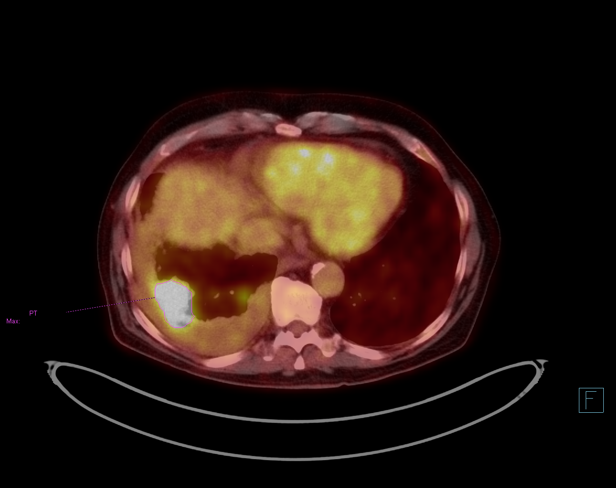
[im 4/14]
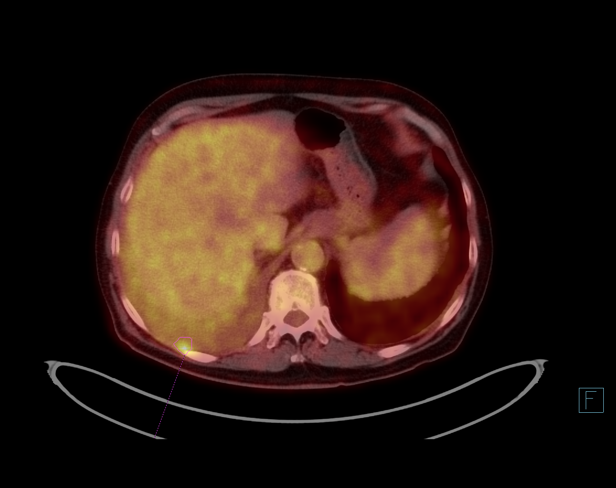
[im 5/14]
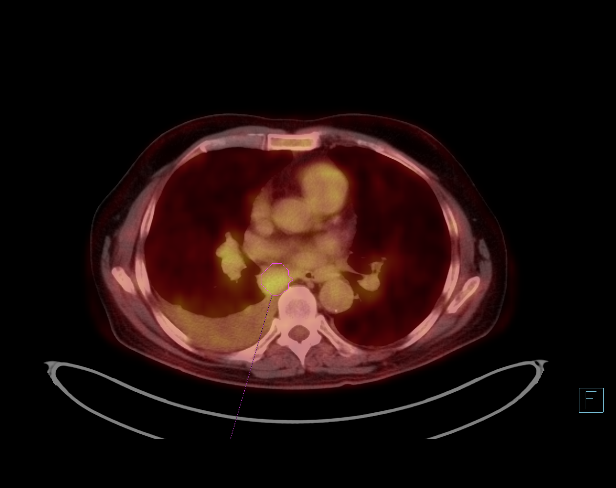
[im 6/14]
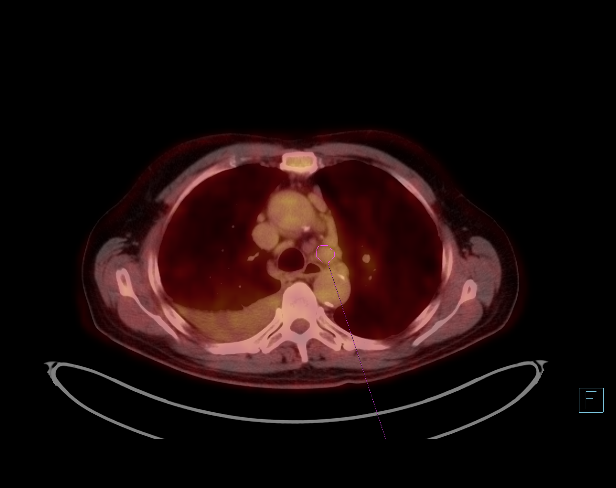
[im 7/14]
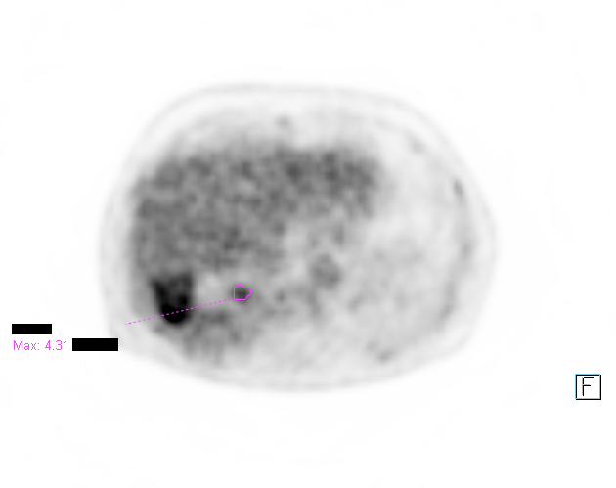
[im 8/14]
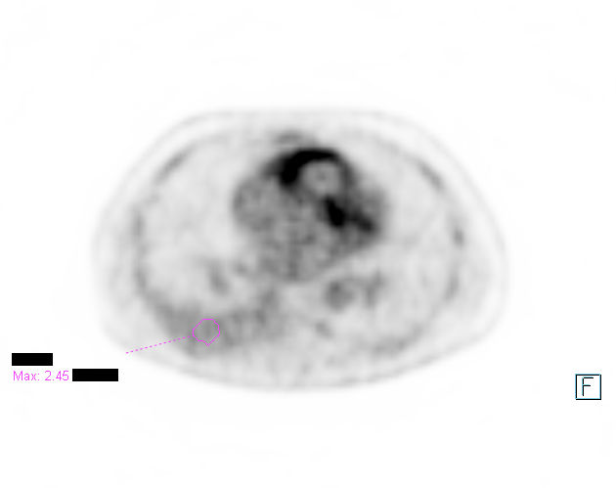
[im 9/14]
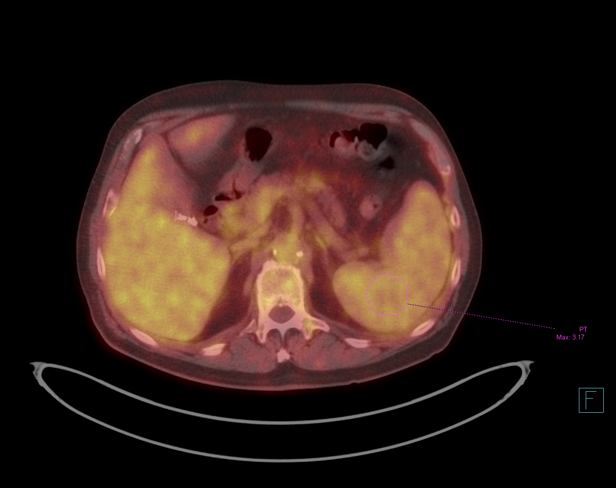
[im 10/14]
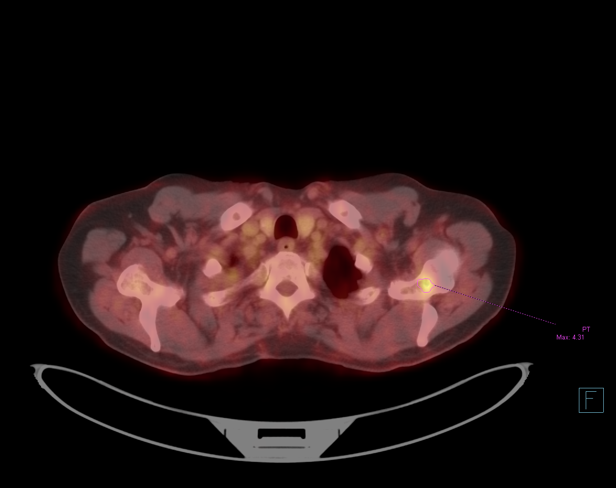
[im 11/14]
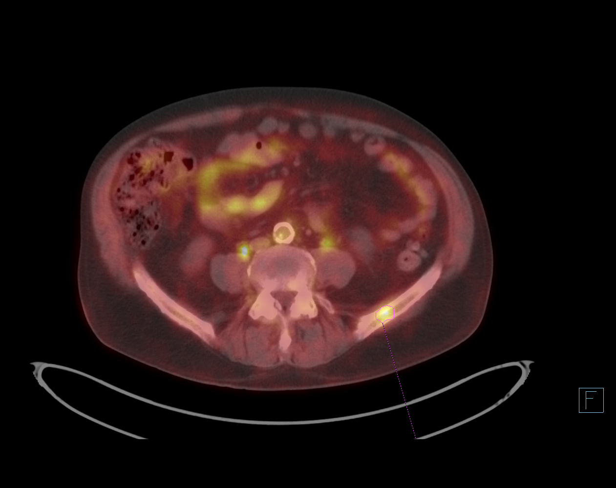
[im 12/14]
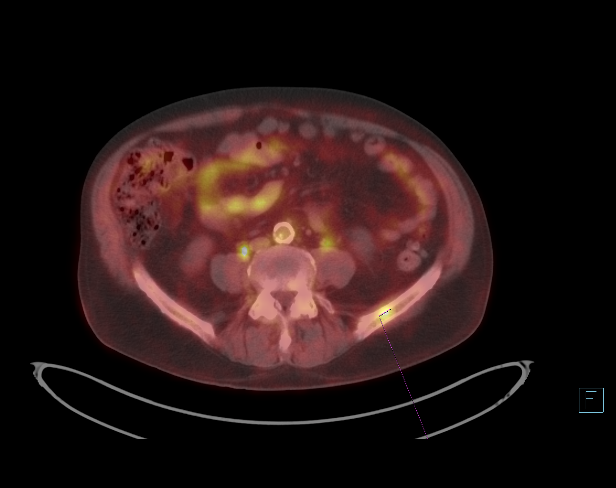
[im 13/14]
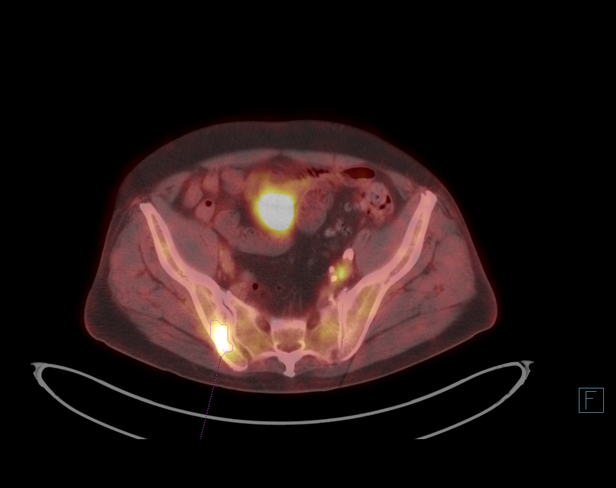
[im 14/14]
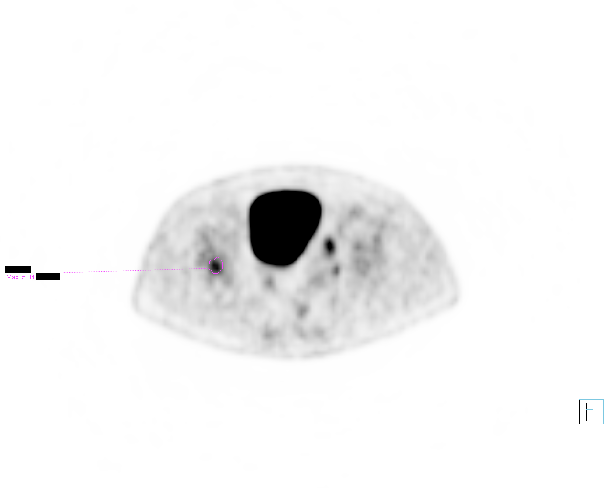

[14 of 14 positions shown; findings below may reference images not displayed]

FINDINGS: Mediastinal blood pool activity: SUV max

Liver activity: SUV max

NECK: No hypermetabolic lymph nodes in the neck.

Incidental CT findings: none

CHEST:

(Image 106, series 4) approximately 4.2 x 3.4 cm RIGHT lower lobe
mass with intense FDG uptake (SUVmax = 10.8)

Signs of pleural effusion in the RIGHT chest. Pleural fluid of
similar volume when compared to the recent CT evaluation. Small
focus of increased FDG uptake in the posterior RIGHT chest (SUVmax =
5.7 (image 116, series 4)) no CT correlate for this focal area of
increased FDG uptake.

Adjacent, separate nodule in the medial RIGHT lower lobe as
described on the recent chest CT also displays increased metabolic
activity (image 106, series 4) nodule itself is better seen on image
109 (SUVmax = 4.3)

Mild increased FDG uptake throughout the RIGHT-sided effusion.
Pleural based nodularity in the posterior RIGHT chest without focus
of discrete uptake beyond the background of adjacent pleural fluid
aside from areas mention previously.

Bulky subcarinal nodal enlargement unchanged from recent CT
measuring approximately 2.0 cm short axis (image 84, series 4)
(SUVmax =

Generalized nodal enlargement throughout the remainder of the chest
unchanged compared to the recent CT with less FDG uptake than the
subcarinal lymph node, for instance an AP window lymph node on image
72 of series 4 shows a maximum SUV of 2.6)

Incidental CT findings: Calcified atheromatous plaque of the
thoracic aorta. No aneurysmal dilation. Calcified coronary artery
disease, three-vessel disease. No pericardial effusion. Pulmonary
findings as outlined on the previous CT. Fullness of bilateral hila
in addition to mediastinal nodal enlargement discussed above. Hilar
lymph nodes without FDG uptake above the background uptake that was
described previously, most avid lymph node in the chest is in the
subcarinal region.

ABDOMEN/PELVIS: Spleen upper limits of normal for size approximately
13 cm, slightly smaller than on studies from 5502. FDG uptake in the
spleen is less than liver though only slightly less at 3.2 as
compared to 3.9. No focal solid organ uptake. No adenopathy in the
abdomen or pelvis with increased metabolic features.

Incidental CT findings: Post cholecystectomy. Mild splenic
enlargement. Pancreas and adrenal glands are normal. RIGHT renal
cysts. No hydronephrosis. Urinary bladder with LEFT posterolateral
bladder diverticulum. Heterogeneous prostate. Calcific atheromatous
plaque of the abdominal aorta without aneurysmal dilation. Scattered
small lymph nodes throughout the retroperitoneum none with FDG
uptake above blood pool activity. No acute gastrointestinal process.
Colonic diverticulosis. Normal appendix.

Small hydrocele on the RIGHT. Small fat containing LEFT inguinal
hernia.

SKELETON: Focal area of uptake without CT correlate in the LEFT
scapula (image 50, series 4) (SUVmax = 4.3)

Focal area of uptake in the LEFT iliac bone approximately w 1.2 cm,
similar size to the above LEFT scapular focus without discrete CT
correlate (SUVmax = 5.3)

Focal area of uptake in RIGHT posterior iliac larger and more
hyperintense and other areas showing sclerosis (image 173, series [DATE] cm, a new area of abnormality when compared to remote studies
(SUVmax = 7.7)

Subtle uptake in the RIGHT acetabulum, only mild degenerative
changes in this location with a maximum SUV of

Incidental CT findings: none
IMPRESSION: 1. Intensely FDG avid mass in the RIGHT lung base with adjacent
nodule also FDG avid, most suspicious for multifocal bronchogenic
neoplasm based on the discrepant FDG uptake that is seen within
enlarged lymph nodes in the chest. Referral to multi disciplinary
thoracic oncologic setting and consideration for biopsy is
suggested.
2. Findings suspicious for malignant effusion in the RIGHT chest as
described. Many visible small pleural based nodules are not FDG avid
but there is a focus of increased FDG uptake in the dependent aspect
of the effusion near the costodiaphragmatic sulcus.
3. Increased FDG uptake within the subcarinal lymph node as compared
other lymph nodes. This is nonspecific, this could represent nodal
involvement from primary pulmonary neoplasm or more profound nodal
involvement with lymphoproliferative process.
4. Signs of bony involvement most notably in the RIGHT posterior
iliac with area of defined sclerosis and increased FDG uptake new
from previous imaging.
5. Mild splenic enlargement with FDG uptake slightly less than
adjacent liver.
6. Diverticulosis, LEFT inguinal hernia and signs of
cholecystectomy.

Aortic Atherosclerosis (VJYMI-S1E.E) and Emphysema (VJYMI-VJM.N).

## 2020-09-19 MED ORDER — FLUDEOXYGLUCOSE F - 18 (FDG) INJECTION
9.4000 | Freq: Once | INTRAVENOUS | Status: AC | PRN
  Administered 2020-09-19: 9.4 via INTRAVENOUS

## 2020-09-19 NOTE — Progress Notes (Signed)
Hematology and Oncology Follow Up Visit  Richard Evans [id number] 12/17/1945 74 y.o. 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  He presented with mild adenopathy as well as autoimmune hemolytic anemia.     Prior Therapy:    Prednisone 70 mg daily started on 06/30/2016. He achieved complete response and prednisone tapered completely off on 09/11/2016. He relapsed in October 2017.  Prednisone 90 mg daily started on 09/30/2016. He achieved complete response on 10/17/2016. He completed prednisone taper on 12/18/2016.  Rituximab at 375 mg/m weekly started on 08/08/2017. He is status post 4 weeks of therapy completed on 08/27/2017.  Prednisone 60 mg daily started on 09/17/2017.  He was tapered off in December 2018 after achieving a complete response.  Current therapy: Active surveillance.    Interim History:  Richard Evans returns today for a repeat evaluation.  Since the last visit, he started developing symptoms of shortness of breath, dyspnea on exertion and hemoptysis.  He required oxygen supplementation with saturation on room air was around 91% and decreased to 84% with ambulation.  CT scan of the chest obtained on no September 11, 2020 which showed enlarging of his right lung mass with stable mediastinal adenopathy.  He was evaluated by Dr. Elsworth Soho and completed PET scan today.  Clinically, he still reports some dyspnea exertion and desaturation of oxygen level on exertion.  Overall he is eating well and performance status has not declined significantly.  .           Medications: Reviewed without changes. Current Outpatient Medications  Medication Sig Dispense Refill  . amoxicillin-clavulanate (AUGMENTIN) 875-125 MG tablet Take 1 tablet by mouth 2 (two) times daily. 14 tablet 0  . atenolol (TENORMIN) 25 MG tablet Take 25 mg by mouth at bedtime.     . Cholecalciferol (VITAMIN D) 2000 units CAPS Take 1  capsule by mouth daily.    . diphenhydrAMINE (BENADRYL) 25 mg capsule Take 25 mg by mouth every 6 (six) hours as needed.    Marland Kitchen ELDERBERRY PO Take by mouth.    . folic acid (FOLVITE) 1 MG tablet TAKE ONE TABLET BY MOUTH EVERY DAY 30 tablet 2  . Tamsulosin HCl (FLOMAX) 0.4 MG CAPS Take 0.4 mg by mouth at bedtime.     . traMADol (ULTRAM) 50 MG tablet Take 1 tablet (50 mg total) by mouth every 8 (eight) hours as needed. 30 tablet 0  . umeclidinium-vilanterol (ANORO ELLIPTA) 62.5-25 MCG/INH AEPB Inhale 1 puff into the lungs daily. 14 each 0   No current facility-administered medications for this visit.     Allergies:  Allergies  Allergen Reactions  . Adhesive [Tape] Itching and Rash    Paper tape or wrapped bandage is preferred      Physical Exam:   Blood pressure 122/72, pulse 67, temperature (!) 96.9 F (36.1 C), temperature source Tympanic, resp. rate 18, height 6' (1.829 m), weight 185 lb 3.2 oz (84 kg), SpO2 93 %.        ECOG: 0    General appearance: Alert, awake without any distress. Head: Atraumatic without abnormalities Oropharynx: Without any thrush or ulcers. Eyes: No scleral icterus. Lymph nodes: No lymphadenopathy noted in the cervical, supraclavicular, or axillary nodes Heart:regular rate and rhythm, without any murmurs or gallops.   Lung: Clear to auscultation without any rhonchi, wheezes or dullness to percussion. Abdomin: Soft, nontender without any shifting dullness or ascites. Musculoskeletal: No clubbing  or cyanosis. Neurological: No motor or sensory deficits. Skin: No rashes or lesions.          Lab Results: Lab Results  Component Value Date   WBC 12.3 (H) 06/12/2020   HGB 14.6 06/12/2020   HCT 40.9 06/12/2020   MCV 95.8 06/12/2020   PLT 121 (L) 06/12/2020     Chemistry      Component Value Date/Time   NA 139 03/07/2020 1124   NA 141 12/03/2017 0741   K 4.6 03/07/2020 1124   K 4.1 12/03/2017 0741   CL 109 03/07/2020 1124   CO2  23 03/07/2020 1124   CO2 22 12/03/2017 0741   BUN 23 03/07/2020 1124   BUN 22.1 12/03/2017 0741   CREATININE 1.15 03/07/2020 1124   CREATININE 1.1 12/03/2017 0741      Component Value Date/Time   CALCIUM 9.2 03/07/2020 1124   CALCIUM 9.3 12/03/2017 0741   ALKPHOS 83 03/07/2020 1124   ALKPHOS 66 12/03/2017 0741   AST 25 03/07/2020 1124   AST 16 12/03/2017 0741   ALT 24 03/07/2020 1124   ALT 22 12/03/2017 0741   BILITOT 2.2 (H) 03/07/2020 1124   BILITOT 1.01 12/03/2017 0741       G ROSS AND MICROSCOPIC INFORMATION  Impression and Plan:   74 year old man with:   1. CLL presented with hemolytic anemia and adenopathy diagnosed in 2017.        He is currently on active surveillance and has not required any specific treatment for his CLL at this time.  Treatment options were reviewed which include oral targeted therapy versus immunotherapy or combination of the above.  For the time being, I do not see any evidence of CLL progression or indication for treatment.  This could certainly change if his pulmonary process is of CLL origin and then treatment will be needed.   2. Autoimmune hemolytic anemia: Hemoglobin remained stable although he has element of compensated hemolysis.  No intervention is needed at this time.  Hemoglobin today reviewed and was within normal range.    3. Thrombocytopenia: Remains mild and does not require any additional intervention.   4.  Right lower lobe lung nodule: He is currently under evaluation by pulmonary medicine including a PET scan which is pending as well as biopsy in the future.  This could represent primary lung neoplasm versus CLL which considered less likely.  PET scan completed today and is currently pending and will schedule follow-up after his biopsy to discuss treatment options.    5. Follow-up: We will be in the next few weeks to follow his progress.  30 minutes were dedicated to this visit.  The time was spent on reviewing  disease status, reviewing treatment options including imaging studies and future plan of care review.Zola Button, MD 10/19/20218:39 AM

## 2020-09-20 ENCOUNTER — Encounter (HOSPITAL_COMMUNITY): Payer: Self-pay | Admitting: Radiology

## 2020-09-20 NOTE — Progress Notes (Signed)
Richard Evans. Richard Evans Male, 74 y.o., [birth year] MRN:  [id number] Phone:  [phone] Jerilynn Mages) PCP:  [name], MD Coverage:  [employer] Next Appt With Pulmonology 10/05/2020 at 9:30 AM  RE: CT Biopsy Received: Today Suttle, Rosanne Ashing, MD  Garth Bigness D Approved for CT guided RLL biopsy.   Dylan       Previous Messages   ----- Message -----  From: Garth Bigness D  Sent: 09/20/2020  9:24 AM EDT  To: [facility] Procedure Requests  Subject: CT Biopsy                     Procedure: CT Biopsy   Reason: Right lower lobe lung mass, enlarging RLL lung mass   History:  CT, NM PET in computer   Provider: Rigoberto Noel   Provider Contact: [phone]

## 2020-09-21 ENCOUNTER — Other Ambulatory Visit: Payer: Self-pay | Admitting: Radiology

## 2020-09-21 NOTE — Telephone Encounter (Signed)
Okay to send order for humidifier

## 2020-09-21 NOTE — Telephone Encounter (Signed)
Spoke with Juliann Pulse and advised that order has been placed to add humidifier to oxygen. She verbalized understanding. Nothing further needed.

## 2020-09-22 ENCOUNTER — Other Ambulatory Visit: Payer: Self-pay

## 2020-09-22 ENCOUNTER — Ambulatory Visit (HOSPITAL_COMMUNITY)
Admission: RE | Admit: 2020-09-22 | Discharge: 2020-09-22 | Disposition: A | Discharge status: Home / Self Care | Payer: PPO | Source: Ambulatory Visit | Attending: Pulmonary Disease | Admitting: Pulmonary Disease

## 2020-09-22 ENCOUNTER — Other Ambulatory Visit: Payer: Self-pay | Admitting: Physician Assistant

## 2020-09-22 DIAGNOSIS — R52 Pain, unspecified: Secondary | ICD-10-CM

## 2020-09-22 DIAGNOSIS — Z20822 Contact with and (suspected) exposure to covid-19: Secondary | ICD-10-CM | POA: Insufficient documentation

## 2020-09-22 DIAGNOSIS — Z01812 Encounter for preprocedural laboratory examination: Secondary | ICD-10-CM | POA: Insufficient documentation

## 2020-09-22 LAB — SARS CORONAVIRUS 2 (TAT 6-24 HRS): SARS Coronavirus 2: NEGATIVE

## 2020-09-22 NOTE — Telephone Encounter (Signed)
Dr. Elsworth Soho, please see pt's mychart message and advise.   To: [facility] POOL    From: [place]: 09/22/2020 8:45 AM     *-*-*This message was handled on 09/22/2020 10:11 AM by Emigdio Wildeman P*-*-*  Can someone please help with this pain, in the back of my shoulder/neck area.  Nothing that I have taken is helping.  It seems as if the ball is being dropped on this, with no one willing to help.  Please give me a call at [phone].  Thank you.

## 2020-09-25 ENCOUNTER — Other Ambulatory Visit: Payer: Self-pay

## 2020-09-25 ENCOUNTER — Ambulatory Visit (HOSPITAL_COMMUNITY)
Admission: RE | Admit: 2020-09-25 | Discharge: 2020-09-25 | Disposition: A | Discharge status: Home / Self Care | Payer: PPO | Source: Ambulatory Visit | Attending: Diagnostic Radiology | Admitting: Diagnostic Radiology

## 2020-09-25 ENCOUNTER — Other Ambulatory Visit (HOSPITAL_COMMUNITY): Payer: Self-pay | Admitting: Diagnostic Radiology

## 2020-09-25 ENCOUNTER — Ambulatory Visit (HOSPITAL_COMMUNITY)
Admission: RE | Admit: 2020-09-25 | Discharge: 2020-09-25 | Disposition: A | Discharge status: Home / Self Care | Payer: PPO | Source: Ambulatory Visit | Attending: Pulmonary Disease | Admitting: Pulmonary Disease

## 2020-09-25 ENCOUNTER — Other Ambulatory Visit (HOSPITAL_COMMUNITY): Payer: PPO

## 2020-09-25 ENCOUNTER — Encounter (HOSPITAL_COMMUNITY): Payer: Self-pay

## 2020-09-25 DIAGNOSIS — Z87891 Personal history of nicotine dependence: Secondary | ICD-10-CM | POA: Diagnosis not present

## 2020-09-25 DIAGNOSIS — J90 Pleural effusion, not elsewhere classified: Secondary | ICD-10-CM

## 2020-09-25 DIAGNOSIS — C3431 Malignant neoplasm of lower lobe, right bronchus or lung: Secondary | ICD-10-CM | POA: Insufficient documentation

## 2020-09-25 DIAGNOSIS — R846 Abnormal cytological findings in specimens from respiratory organs and thorax: Secondary | ICD-10-CM | POA: Diagnosis not present

## 2020-09-25 DIAGNOSIS — R918 Other nonspecific abnormal finding of lung field: Secondary | ICD-10-CM

## 2020-09-25 DIAGNOSIS — J984 Other disorders of lung: Secondary | ICD-10-CM | POA: Diagnosis not present

## 2020-09-25 DIAGNOSIS — Z9889 Other specified postprocedural states: Secondary | ICD-10-CM

## 2020-09-25 LAB — CBC
HCT: 41 % (ref 39.0–52.0)
Hemoglobin: 13.6 g/dL (ref 13.0–17.0)
MCH: 30.9 pg (ref 26.0–34.0)
MCHC: 33.2 g/dL (ref 30.0–36.0)
MCV: 93.2 fL (ref 80.0–100.0)
Platelets: 192 10*3/uL (ref 150–400)
RBC: 4.4 MIL/uL (ref 4.22–5.81)
RDW: 14.3 % (ref 11.5–15.5)
WBC: 10.4 10*3/uL (ref 4.0–10.5)
nRBC: 0 % (ref 0.0–0.2)

## 2020-09-25 LAB — PROTIME-INR
INR: 1.1 (ref 0.8–1.2)
Prothrombin Time: 14 seconds (ref 11.4–15.2)

## 2020-09-25 MED ORDER — LIDOCAINE HCL 1 % IJ SOLN
INTRAMUSCULAR | Status: AC
  Filled 2020-09-25: qty 20

## 2020-09-25 MED ORDER — SODIUM CHLORIDE 0.9 % IV SOLN: INTRAVENOUS | Status: DC

## 2020-09-25 MED ORDER — FENTANYL CITRATE (PF) 100 MCG/2ML IJ SOLN
INTRAMUSCULAR | Status: AC | PRN
  Administered 2020-09-25 (×2): 25 ug via INTRAVENOUS

## 2020-09-25 MED ORDER — ACETAMINOPHEN-CODEINE #3 300-30 MG PO TABS: 1.0000 | ORAL_TABLET | Freq: Three times a day (TID) | ORAL | Status: AC | PRN

## 2020-09-25 MED ORDER — FENTANYL CITRATE (PF) 100 MCG/2ML IJ SOLN
INTRAMUSCULAR | Status: AC
  Filled 2020-09-25: qty 2

## 2020-09-25 MED ORDER — MIDAZOLAM HCL 2 MG/2ML IJ SOLN
INTRAMUSCULAR | Status: AC
  Filled 2020-09-25: qty 2

## 2020-09-25 MED ORDER — MIDAZOLAM HCL 2 MG/2ML IJ SOLN
INTRAMUSCULAR | Status: AC | PRN
  Administered 2020-09-25: 1 mg via INTRAVENOUS

## 2020-09-25 NOTE — Sedation Documentation (Signed)
Image guided Right lung mass biopsy and possible chest tube placement and thoracentesis

## 2020-09-25 NOTE — Discharge Instructions (Signed)
Lung Biopsy, Care After This sheet gives you information about how to care for yourself after your procedure. Your health care provider may also give you more specific instructions depending on the type of biopsy you had. If you have problems or questions, contact your health care provider. What can I expect after the procedure? After the procedure, it is common to have:  A cough.  A sore throat.  Pain where a needle, bronchoscope, or incision was used to collect a biopsy sample (biopsy site). Follow these instructions at home: Medicines  Take over-the-counter and prescription medicines only as told by your health care provider.  Do not drink alcohol if your health care provider tells you not to drink.  Ask your health care provider if the medicine prescribed to you: ? Requires you to avoid driving or using heavy machinery. ? Can cause constipation. You may need to take these actions to prevent or treat constipation:  Drink enough fluid to keep your urine pale yellow.  Take over-the-counter or prescription medicines.  Eat foods that are high in fiber, such as beans, whole grains, and fresh fruits and vegetables.  Limit foods that are high in fat and processed sugars, such as fried or sweet foods.  Do not drive for 24 hours if you were given a sedative. Biopsy site care   Follow instructions from your health care provider about how to take care of your biopsy site. Make sure you: ? Wash your hands with soap and water before and after you change your bandage (dressing). If soap and water are not available, use hand sanitizer. ? Change your dressing as told by your health care provider. ? Leave stitches (sutures), skin glue, or adhesive strips in place. These skin closures may need to stay in place for 2 weeks or longer. If adhesive strip edges start to loosen and curl up, you may trim the loose edges. Do not remove adhesive strips completely unless your health care provider tells  you to do that.  Do not take baths, swim, or use a hot tub until your health care provider approves. Ask your health care provider if you may take showers. You may only be allowed to take sponge baths.  Check your biopsy site every day for signs of infection. Check for: ? Redness, swelling, or more pain. ? Fluid or blood. ? Warmth. ? Pus or a bad smell. General instructions  Return to your normal activities as told by your health care provider. Ask your health care provider what activities are safe for you.  It is up to you to get the results of your procedure. Ask your health care provider, or the department that is doing the procedure, when your results will be ready.  Keep all follow-up visits as told by your health care provider. This is important. Contact a health care provider if:  You have a fever.  You have redness, swelling, or more pain around your biopsy site.  You have fluid or blood coming from your biopsy site.  Your biopsy site feels warm to the touch.  You have pus or a bad smell coming from your biopsy site.  You have pain that does not get better with medicine. Get help right away if:  You cough up blood.  You have trouble breathing.  You have chest pain.  You lose consciousness. Summary  After the procedure, it is common to have a sore throat and a cough.  Return to your normal activities as told by your  health care provider. Ask your health care provider what activities are safe for you.  Take over-the-counter and prescription medicines only as told by your health care provider.  Report any unusual symptoms to your health care provider. This information is not intended to replace advice given to you by your health care provider. Make sure you discuss any questions you have with your health care provider. Document Revised: 12/23/2018 Document Reviewed: 12/17/2016 Elsevier Patient Education  [place].

## 2020-09-25 NOTE — Addendum Note (Signed)
Addended by: Rigoberto Noel on: 09/25/2020 01:39 PM   Modules accepted: Orders

## 2020-09-25 NOTE — Discharge Instructions (Signed)
Lung Biopsy, Care After This sheet gives you information about how to care for yourself after your procedure. Your health care provider may also give you more specific instructions depending on the type of biopsy you had. If you have problems or questions, contact your health care provider. What can I expect after the procedure? After the procedure, it is common to have:  A cough.  A sore throat.  Pain where a needle, bronchoscope, or incision was used to collect a biopsy sample (biopsy site). Follow these instructions at home: Medicines  Take over-the-counter and prescription medicines only as told by your health care provider.  Do not drink alcohol if your health care provider tells you not to drink.  Ask your health care provider if the medicine prescribed to you: ? Requires you to avoid driving or using heavy machinery. ? Can cause constipation. You may need to take these actions to prevent or treat constipation:  Drink enough fluid to keep your urine pale yellow.  Take over-the-counter or prescription medicines.  Eat foods that are high in fiber, such as beans, whole grains, and fresh fruits and vegetables.  Limit foods that are high in fat and processed sugars, such as fried or sweet foods.  Do not drive for 24 hours if you were given a sedative. Biopsy site care   Follow instructions from your health care provider about how to take care of your biopsy site. Make sure you: ? Wash your hands with soap and water before and after you change your bandage (dressing). If soap and water are not available, use hand sanitizer. ? Change your dressing as told by your health care provider. ? Leave stitches (sutures), skin glue, or adhesive strips in place. These skin closures may need to stay in place for 2 weeks or longer. If adhesive strip edges start to loosen and curl up, you may trim the loose edges. Do not remove adhesive strips completely unless your health care provider tells  you to do that.  Do not take baths, swim, or use a hot tub until your health care provider approves. Ask your health care provider if you may take showers. You may only be allowed to take sponge baths.  Check your biopsy site every day for signs of infection. Check for: ? Redness, swelling, or more pain. ? Fluid or blood. ? Warmth. ? Pus or a bad smell. General instructions  Return to your normal activities as told by your health care provider. Ask your health care provider what activities are safe for you.  It is up to you to get the results of your procedure. Ask your health care provider, or the department that is doing the procedure, when your results will be ready.  Keep all follow-up visits as told by your health care provider. This is important. Contact a health care provider if:  You have a fever.  You have redness, swelling, or more pain around your biopsy site.  You have fluid or blood coming from your biopsy site.  Your biopsy site feels warm to the touch.  You have pus or a bad smell coming from your biopsy site.  You have pain that does not get better with medicine. Get help right away if:  You cough up blood.  You have trouble breathing.  You have chest pain.  You lose consciousness. Summary  After the procedure, it is common to have a sore throat and a cough.  Return to your normal activities as told by your  health care provider. Ask your health care provider what activities are safe for you.  Take over-the-counter and prescription medicines only as told by your health care provider.  Report any unusual symptoms to your health care provider. This information is not intended to replace advice given to you by your health care provider. Make sure you discuss any questions you have with your health care provider. Document Revised: 12/23/2018 Document Reviewed: 12/17/2016 Elsevier Patient Education  [place].

## 2020-09-25 NOTE — Procedures (Signed)
Interventional Radiology Procedure:   Indications: Right lung mass  Procedure: CT guided right thoracentesis and lung mass biopsy  Findings: 760 ml of yellow fluid removed.  3 cores obtained.  Tiny pneumothorax at end of procedure.  Complications: None     EBL: less than 10 ml  Plan: CXR in one hour   Torren Maffeo R. Anselm Pancoast, MD  Pager: [phone]

## 2020-09-25 NOTE — Telephone Encounter (Signed)
Unfortunately his pain is likely due to metastatic cancer. Seems like tramadol has not helped.  He underwent biopsy today and will take 2 to 3 days for results followed by referral back to oncology. He should try OTC Aleve twice daily 400 twice daily-which may help with musculoskeletal pain Meanwhile, I will send Tylenol #3 to take every 8 hours as needed # 30 He needs to get back to his PCP, please also make appointment with Dr. Alen Blew from oncology within 1 week to discuss biopsy results and next course of action

## 2020-09-25 NOTE — H&P (Signed)
Chief Complaint: Patient was seen in consultation today for right lung mass biopsy at the request of Alva,Rakesh V  Referring Physician(s): Rigoberto Noel  Supervising Physician: Markus Daft  Patient Status: [facility] - Out-pt  History of Present Illness: Richard Evans is a 74 y.o. male    Hx CLL - follows with Dr Bedelia Person  Was seen in Oncology office to follow up CT Chest performed as routine  Noted enlargement of RLL nodule-- known to Dr Alen Blew (since 2017) Also new lymphadenopathy Referred to Dr Elsworth Soho  Former smoker-- quit 2010 +hemoptysis x few weeks  CT chest WO contrast 09/2020 >> Enlarging right lower lobe mass with multiple new right lung nodules.  Mediastinal, hilar and upper abdominal adenopathy is grossly stable from 03/07/2020 in this patient with a history of CLL.  New small bilateral pleural effusions, partially loculated and slightly larger on the right  PET 09/19/20:  IMPRESSION: 1. Intensely FDG avid mass in the RIGHT lung base with adjacent nodule also FDG avid, most suspicious for multifocal bronchogenic neoplasm based on the discrepant FDG uptake that is seen within enlarged lymph nodes in the chest. Referral to multi disciplinary thoracic oncologic setting and consideration for biopsy is suggested. 2. Findings suspicious for malignant effusion in the RIGHT chest as described. Many visible small pleural based nodules are not FDG avid but there is a focus of increased FDG uptake in the dependent aspect of the effusion near the costodiaphragmatic sulcus. 3. Increased FDG uptake within the subcarinal lymph node as compared other lymph nodes. This is nonspecific, this could represent nodal involvement from primary pulmonary neoplasm or more profound nodal involvement with lymphoproliferative process. 4. Signs of bony involvement most notably in the RIGHT posterior iliac with area of defined sclerosis and increased FDG uptake new from previous  imaging.  Now scheduled for RLL mass biopsy in IR  Past Medical History:  Diagnosis Date  . Hemolytic anemia ([place])   . History of epistaxis    hx of [facility] ASA.[facility]  . Hypertension   . Pneumonia   . Vocal cord nodule    benign    Past Surgical History:  Procedure Laterality Date  . COLONOSCOPY  12/96   Dr. Rehman>diverticulosis/ external hemorrhoids  . THROAT SURGERY  1977   nodules removed from larnyx    Allergies: Anoro ellipta [umeclidinium-vilanterol], Norco [hydrocodone-acetaminophen], and Adhesive [tape]  Medications: Prior to Admission medications   Medication Sig Start Date End Date Taking? Authorizing Provider  amoxicillin-clavulanate (AUGMENTIN) 875-125 MG tablet Take 1 tablet by mouth 2 (two) times daily. 09/15/20  Yes Rigoberto Noel, MD  atenolol (TENORMIN) 25 MG tablet Take 25 mg by mouth at bedtime.    Yes [provider]  Cholecalciferol (VITAMIN D) 2000 units CAPS Take 2,000 Units by mouth daily.    Yes [provider]  diphenhydrAMINE (BENADRYL) 25 mg capsule Take 25 mg by mouth at bedtime.    Yes [provider]  ELDERBERRY PO Take 1 capsule by mouth daily.    Yes [provider]  folic acid (FOLVITE) 151 MCG tablet Take 800 mcg by mouth daily.   Yes [provider]  Tamsulosin HCl (FLOMAX) 0.4 MG CAPS Take 0.4 mg by mouth at bedtime.    Yes [provider]  traMADol (ULTRAM) 50 MG tablet Take 1 tablet (50 mg total) by mouth every 8 (eight) hours as needed. Patient taking differently: Take 50 mg by mouth every 8 (eight) hours as  needed for moderate pain.  09/15/20  Yes Rigoberto Noel, MD  folic acid (FOLVITE) 1 MG tablet TAKE ONE TABLET BY MOUTH EVERY DAY Patient not taking: Reported on 09/20/2020 10/27/17   Moorehead Portela, MD  umeclidinium-vilanterol (ANORO ELLIPTA) 62.5-25 MCG/INH AEPB Inhale 1 puff into the lungs daily. Patient not taking: Reported on 09/20/2020  09/12/20   Rigoberto Noel, MD     Family History  Problem Relation Age of Onset  . Brain cancer Mother   . Heart attack Father   . Brain cancer Brother   . Cancer - Lung Brother   . Stroke Brother     Social History   Socioeconomic History  . Marital status: Single    Spouse name: Not on file  . Number of children: Not on file  . Years of education: Not on file  . Highest education level: Not on file  Occupational History  . Not on file  Tobacco Use  . Smoking status: Former Smoker    Packs/day: 2.00    Years: 50.00    Pack years: 100.00    Types: Cigarettes    Quit date: 11/30/2009    Years since quitting: 10.8  . Smokeless tobacco: Never Used  Substance and Sexual Activity  . Alcohol use: No  . Drug use: No  . Sexual activity: Yes    Birth control/protection: None  Other Topics Concern  . Not on file  Social History Narrative  . Not on file   Social Determinants of Health   Financial Resource Strain:   . Difficulty of Paying Living Expenses: Not on file  Food Insecurity:   . Worried About Charity fundraiser in the Last Year: Not on file  . Ran Out of Food in the Last Year: Not on file  Transportation Needs:   . Lack of Transportation (Medical): Not on file  . Lack of Transportation (Non-Medical): Not on file  Physical Activity:   . Days of Exercise per Week: Not on file  . Minutes of Exercise per Session: Not on file  Stress:   . Feeling of Stress : Not on file  Social Connections:   . Frequency of Communication with Friends and Family: Not on file  . Frequency of Social Gatherings with Friends and Family: Not on file  . Attends Religious Services: Not on file  . Active Member of Clubs or Organizations: Not on file  . Attends Archivist Meetings: Not on file  . Marital Status: Not on file    Review of Systems: A 12 point ROS discussed and pertinent positives are indicated in the HPI above.  All other systems are negative.  Review of  Systems  Constitutional: Negative for activity change, fatigue, fever and unexpected weight change.  Respiratory: Positive for cough. Negative for choking and shortness of breath.   Cardiovascular: Negative for chest pain.  Gastrointestinal: Negative for abdominal pain.  Psychiatric/Behavioral: Negative for behavioral problems and confusion.    Vital Signs: BP 137/63   Pulse (!) 58   Temp 97.6 F (36.4 C) (Oral)   Resp 14   Ht 6' (1.829 m)   Wt 185 lb (83.9 kg)   SpO2 96%   BMI 25.09 kg/m   Physical Exam Vitals reviewed.  Cardiovascular:     Rate and Rhythm: Normal rate and regular rhythm.     Pulses: Normal pulses.     Heart sounds: Normal heart sounds.  Pulmonary:     Effort: Pulmonary effort  is normal.     Breath sounds: Normal breath sounds.  Abdominal:     Palpations: Abdomen is soft.     Tenderness: There is no abdominal tenderness.  Musculoskeletal:        General: Normal range of motion.  Skin:    General: Skin is warm.  Neurological:     Mental Status: He is alert and oriented to person, place, and time.  Psychiatric:        Behavior: Behavior normal.        Thought Content: Thought content normal.        Judgment: Judgment normal.     Imaging: CT CHEST WO CONTRAST  Result Date: 09/11/2020 CLINICAL DATA:  Lung nodule.  Shortness of breath, hemoptysis.  CLL. EXAM: CT CHEST WITHOUT CONTRAST TECHNIQUE: Multidetector CT imaging of the chest was performed following the standard protocol without IV contrast. COMPARISON:  03/07/2020 and 11/18/2017. FINDINGS: Cardiovascular: Atherosclerotic calcification of the aorta and coronary arteries. Pulmonic trunk and heart are enlarged. No pericardial effusion. Mediastinum/Nodes: Mediastinal lymph nodes measure up to 13 mm in the AP window, as before. Subcarinal adenopathy measures 2.3 cm. Presumed bihilar adenopathy, poorly evaluated without IV contrast but similar in configuration to the prior exam. No axillary adenopathy.  Esophagus is grossly unremarkable. Lungs/Pleura: Centrilobular emphysema. Linear scarring in the right upper lobe. Peripheral mass in the inferior right lower lobe has enlarged, now measuring 3.3 x 4.2 cm (4/143), compared to 2.2 x 2.8 cm on 03/07/2020. There is a new separate nodule in the adjacent medial right lower lobe, measuring 1.1 x 1.5 cm (4/147). Also several new subpleural nodules in the right hemithorax, measuring up to 7 mm in the lateral right upper lobe (4/83). Small, partially loculated right pleural effusion is new. Simple appearing small left pleural effusion, also new. Calcified granuloma in the left lower lobe. Airway is unremarkable. Upper Abdomen: Visualized portions of the liver, adrenal glands, kidneys, spleen, pancreas, stomach and bowel are grossly unremarkable. Slightly enlarged upper abdominal lymph nodes measure up to 13 mm in the gastrohepatic ligament, similar. Cholecystectomy. Musculoskeletal: Degenerative changes in the spine. No worrisome lytic or sclerotic lesions. IMPRESSION: 1. Enlarging right lower lobe mass with multiple new right lung nodules. Findings may be due to primary bronchogenic carcinoma or pulmonary lymphoma. 2. Mediastinal, hilar and upper abdominal adenopathy is grossly stable from 03/07/2020 in this patient with a history of CLL. 3. aortic atherosclerosis (ICD10-I70.0). Coronary artery calcification. 4. New small bilateral pleural effusions, partially loculated and slightly larger on the right. 5. Enlarged pulmonic trunk, indicative of pulmonary arterial hypertension. 6.  Emphysema (ICD10-J43.9). Electronically Signed   By: Lorin Picket M.D.   On: 09/11/2020 14:12   NM PET Image Initial (PI) Skull Base To Thigh  Result Date: 09/19/2020 CLINICAL DATA:  Initial treatment strategy for RIGHT lung mass. EXAM: NUCLEAR MEDICINE PET SKULL BASE TO THIGH TECHNIQUE: 9.4 mCi F-18 FDG was injected intravenously. Full-ring PET imaging was performed from the skull base to  thigh after the radiotracer. CT data was obtained and used for attenuation correction and anatomic localization. Fasting blood glucose: 144 mg/dl COMPARISON:  09/11/2020 and multiple prior studies FINDINGS: Mediastinal blood pool activity: SUV max 3.0 Liver activity: SUV max 3.88 NECK: No hypermetabolic lymph nodes in the neck. Incidental CT findings: none CHEST: (Image 106, series 4) approximately 4.2 x 3.4 cm RIGHT lower lobe mass with intense FDG uptake (SUVmax = 10.8) Signs of pleural effusion in the RIGHT chest. Pleural fluid of similar volume when  compared to the recent CT evaluation. Small focus of increased FDG uptake in the posterior RIGHT chest (SUVmax = 5.7 (image 116, series 4)) no CT correlate for this focal area of increased FDG uptake. Adjacent, separate nodule in the medial RIGHT lower lobe as described on the recent chest CT also displays increased metabolic activity (image 568, series 4) nodule itself is better seen on image 109 (SUVmax = 4.3) Mild increased FDG uptake throughout the RIGHT-sided effusion. Pleural based nodularity in the posterior RIGHT chest without focus of discrete uptake beyond the background of adjacent pleural fluid aside from areas mention previously. Bulky subcarinal nodal enlargement unchanged from recent CT measuring approximately 2.0 cm short axis (image 84, series 4) (SUVmax = 4.3 Generalized nodal enlargement throughout the remainder of the chest unchanged compared to the recent CT with less FDG uptake than the subcarinal lymph node, for instance an AP window lymph node on image 72 of series 4 shows a maximum SUV of 2.6) Incidental CT findings: Calcified atheromatous plaque of the thoracic aorta. No aneurysmal dilation. Calcified coronary artery disease, three-vessel disease. No pericardial effusion. Pulmonary findings as outlined on the previous CT. Fullness of bilateral hila in addition to mediastinal nodal enlargement discussed above. Hilar lymph nodes without FDG  uptake above the background uptake that was described previously, most avid lymph node in the chest is in the subcarinal region. ABDOMEN/PELVIS: Spleen upper limits of normal for size approximately 13 cm, slightly smaller than on studies from 2018. FDG uptake in the spleen is less than liver though only slightly less at 3.2 as compared to 3.9. No focal solid organ uptake. No adenopathy in the abdomen or pelvis with increased metabolic features. Incidental CT findings: Post cholecystectomy. Mild splenic enlargement. Pancreas and adrenal glands are normal. RIGHT renal cysts. No hydronephrosis. Urinary bladder with LEFT posterolateral bladder diverticulum. Heterogeneous prostate. Calcific atheromatous plaque of the abdominal aorta without aneurysmal dilation. Scattered small lymph nodes throughout the retroperitoneum none with FDG uptake above blood pool activity. No acute gastrointestinal process. Colonic diverticulosis. Normal appendix. Small hydrocele on the RIGHT. Small fat containing LEFT inguinal hernia. SKELETON: Focal area of uptake without CT correlate in the LEFT scapula (image 50, series 4) (SUVmax = 4.3) Focal area of uptake in the LEFT iliac bone approximately w 1.2 cm, similar size to the above LEFT scapular focus without discrete CT correlate (SUVmax = 5.3) Focal area of uptake in RIGHT posterior iliac larger and more hyperintense and other areas showing sclerosis (image 173, series 4) 2.2 cm, a new area of abnormality when compared to remote studies (SUVmax = 7.7) Subtle uptake in the RIGHT acetabulum, only mild degenerative changes in this location with a maximum SUV of 5.0 Incidental CT findings: none IMPRESSION: 1. Intensely FDG avid mass in the RIGHT lung base with adjacent nodule also FDG avid, most suspicious for multifocal bronchogenic neoplasm based on the discrepant FDG uptake that is seen within enlarged lymph nodes in the chest. Referral to multi disciplinary thoracic oncologic setting and  consideration for biopsy is suggested. 2. Findings suspicious for malignant effusion in the RIGHT chest as described. Many visible small pleural based nodules are not FDG avid but there is a focus of increased FDG uptake in the dependent aspect of the effusion near the costodiaphragmatic sulcus. 3. Increased FDG uptake within the subcarinal lymph node as compared other lymph nodes. This is nonspecific, this could represent nodal involvement from primary pulmonary neoplasm or more profound nodal involvement with lymphoproliferative process. 4. Signs of  bony involvement most notably in the RIGHT posterior iliac with area of defined sclerosis and increased FDG uptake new from previous imaging. 5. Mild splenic enlargement with FDG uptake slightly less than adjacent liver. 6. Diverticulosis, LEFT inguinal hernia and signs of cholecystectomy. Aortic Atherosclerosis (ICD10-I70.0) and Emphysema (ICD10-J43.9). Electronically Signed   By: Zetta Bills M.D.   On: 09/19/2020 11:06    Labs:  CBC: Recent Labs    03/22/20 0800 04/19/20 0758 06/12/20 0908 09/19/20 0838  WBC 13.2* 14.6* 12.3* 11.2*  HGB 13.9 14.9 14.6 14.2  HCT 40.5 41.6 40.9 41.4  PLT 112* 112* 121* 146*    COAGS: No results for input(s): INR, APTT in the last 8760 hours.  BMP: Recent Labs    12/14/19 0910 03/07/20 1124  NA 142 139  K 4.2 4.6  CL 109 109  CO2 22 23  GLUCOSE 185* 103*  BUN 20 23  CALCIUM 8.6* 9.2  CREATININE 1.17 1.15  GFRNONAA >60 >60  GFRAA >60 >60    LIVER FUNCTION TESTS: Recent Labs    12/14/19 0910 03/07/20 1124  BILITOT 2.2* 2.2*  AST 22 25  ALT 18 24  ALKPHOS 79 83  PROT 7.4 7.7  ALBUMIN 4.4 4.3    TUMOR MARKERS: No results for input(s): AFPTM, CEA, CA199, CHROMGRNA in the last 8760 hours.  Assessment and Plan:  Enlarging Right lung mass New lymphadenopathy All +PET Scheduled now for biopsy of RLL mass per Dr Elsworth Soho  Risks and benefits of CT guided lung nodule biopsy was discussed  with the patient including, but not limited to bleeding, hemoptysis, respiratory failure requiring intubation, infection, pneumothorax requiring chest tube placement, stroke from air embolism or even death.  All of the patient's questions were answered and the patient is agreeable to proceed.  Consent signed and in chart.   Thank you for this interesting consult.  I greatly enjoyed meeting Clint S Buntin and look forward to participating in their care.  A copy of this report was sent to the requesting provider on this date.  Electronically Signed: Lavonia Drafts, PA-C 09/25/2020, 10:20 AM   I spent a total of  30 Minutes   in face to face in clinical consultation, greater than 50% of which was counseling/coordinating care for RLL mass biopsy

## 2020-09-26 NOTE — Telephone Encounter (Signed)
Richard Evans  This is for you I think  THanks  MR

## 2020-09-26 NOTE — Telephone Encounter (Signed)
Dr. Elsworth Soho Please advise on the following MyChart message.  Thank you.    - Message -----    From: Richard Evans    Sent: 09/26/2020  4:15 PM EDT      To: [name]. Elsworth Soho, MD Subject: Question regarding CT BIOPSY  What is the diagnosis of the mass in the lower right lung?

## 2020-09-27 NOTE — Telephone Encounter (Signed)
Discussed with pathologist and gave results to patient. Biopsy definitely shows lung cancer, favor squamous, waiting on immunostains Pleural fluid showed CLL  PET scan showed lymphadenopathy and bone lesions Defer to oncology on further plan of care

## 2020-09-28 LAB — CYTOLOGY - NON PAP

## 2020-09-28 LAB — SURGICAL PATHOLOGY

## 2020-10-02 ENCOUNTER — Ambulatory Visit: Payer: PPO | Admitting: Pulmonary Disease

## 2020-10-05 ENCOUNTER — Encounter (HOSPITAL_COMMUNITY): Payer: Self-pay

## 2020-10-05 ENCOUNTER — Ambulatory Visit (INDEPENDENT_AMBULATORY_CARE_PROVIDER_SITE_OTHER): Payer: PPO

## 2020-10-05 ENCOUNTER — Ambulatory Visit: Payer: PPO | Admitting: Pulmonary Disease

## 2020-10-05 ENCOUNTER — Other Ambulatory Visit: Payer: Self-pay

## 2020-10-05 ENCOUNTER — Encounter: Payer: Self-pay | Admitting: Pulmonary Disease

## 2020-10-05 ENCOUNTER — Other Ambulatory Visit: Payer: Self-pay | Admitting: *Deleted

## 2020-10-05 VITALS — BP 130/60 | HR 70 | Temp 98.0°F | Ht 72.0 in | Wt 187.4 lb

## 2020-10-05 DIAGNOSIS — C3491 Malignant neoplasm of unspecified part of right bronchus or lung: Secondary | ICD-10-CM | POA: Diagnosis not present

## 2020-10-05 DIAGNOSIS — R0600 Dyspnea, unspecified: Secondary | ICD-10-CM | POA: Diagnosis not present

## 2020-10-05 DIAGNOSIS — J9611 Chronic respiratory failure with hypoxia: Secondary | ICD-10-CM

## 2020-10-05 DIAGNOSIS — J432 Centrilobular emphysema: Secondary | ICD-10-CM

## 2020-10-05 DIAGNOSIS — J90 Pleural effusion, not elsewhere classified: Secondary | ICD-10-CM | POA: Diagnosis not present

## 2020-10-05 DIAGNOSIS — J439 Emphysema, unspecified: Secondary | ICD-10-CM | POA: Diagnosis not present

## 2020-10-05 MED ORDER — PREDNISONE 10 MG PO TABS: ORAL_TABLET | ORAL | 0 refills | Status: AC

## 2020-10-05 MED ORDER — ANORO ELLIPTA 62.5-25 MCG/INH IN AEPB: 1.0000 | INHALATION_SPRAY | Freq: Every day | RESPIRATORY_TRACT | 0 refills | Status: DC

## 2020-10-05 MED ORDER — FUROSEMIDE 40 MG PO TABS: 40.0000 mg | ORAL_TABLET | Freq: Every day | ORAL | 0 refills | Status: DC

## 2020-10-05 NOTE — Progress Notes (Signed)
LMTCB

## 2020-10-05 NOTE — Assessment & Plan Note (Signed)
We will provide him a prescription for Anoro. We will give him a short course of prednisone since this is helped him in the past Prednisone 10 mg tabs Take 4 tabs  daily with food x 4 days, then 3 tabs daily x 4 days, then 2 tabs daily x 4 days, then 1 tab daily x4 days then stop. #40  For the pleural effusion will trial low-dose Lasix

## 2020-10-05 NOTE — Progress Notes (Signed)
   Subjective:    Patient ID: Richard Evans, male    DOB: 02-25-1946, 74 y.o.   MRN: [id number]  HPI  71 yoex heavy smoker for FU of COPD & RLL lung mass He was seen in 2017 forhemoptysis and abnormal CTshowing centrilobular emphysema  PMH -  06/2016 CLL - presented with lymphocytosis,lymphadenopathy and autoimmune hemolytic anemia He was treated with high-dose steroids in 2017 and then Rituxan in 2018  He smoked about a pack per day until 2010 when he quit-about 50-pack-years.  On his last visit 09/12/2020, we set him up with oxygen for hypoxia.  He has been using up to 3 L but continues to desaturate on walking, today he desaturated to 84%.  He has dyspnea on minimal exertion now class III Routine activities such as showering, puts him out of breath.  We discussed PET scan results, his daughter and wife accompanies and he had 2 other daughters on the phone.  We also discussed biopsy results which showed squamous cell carcinoma. Pleural fluid showed lymphocytes suggestive of CLL  He complains of right shoulder pain, this was not relieved by Tylenol, I sent him tramadol and this did not help.  I prescribed Tylenol 3 but he did not fill this instead he took Advil which seems to work better   Significant tests/ events reviewed  CT chest WO contrast 09/2020 >> Enlarging right lower lobe mass with multiple new right lung nodules.  Mediastinal, hilar and upper abdominal adenopathy is grossly stable from 03/07/2020 in this patient with a history of CLL.  New small bilateral pleural effusions, partially loculated and slightly larger on the right    Spirometry1/2018 -no evidence of airway obstruction with FEV1 of 85% and FVC of 81%   CT imaging10/2017showed underlying centrilobular emphysema and right upper lobe airspace disease which was new when compared to his CT from 06/2016 .  CT chest 12/2018Stable to minimal progression of mild mediastinal and hilar  lymphadenopathy Stable to slight decrease in size of the soft tissue lesion adjacent to the distal duodenum.  Review of Systems neg for any significant sore throat, dysphagia, itching, sneezing, nasal congestion or excess/ purulent secretions, fever, chills, sweats, unintended wt loss, pleuritic or exertional cp, hempoptysis, orthopnea pnd or change in chronic leg swelling. Also denies presyncope, palpitations, heartburn, abdominal pain, nausea, vomiting, diarrhea or change in bowel or urinary habits, dysuria,hematuria, rash, arthralgias, visual complaints, headache, numbness weakness or ataxia.     Objective:   Physical Exam  Gen. Pleasant, well-nourished, in no distress ENT - no thrush, no pallor/icterus,no post nasal drip Neck: No JVD, no thyromegaly, no carotid bruits Lungs: no use of accessory muscles, no dullness to percussion, decreased RT base without rales or rhonchi  Cardiovascular: Rhythm regular, heart sounds  normal, no murmurs or gallops, no peripheral edema Musculoskeletal: No deformities, no cyanosis or clubbing        Assessment & Plan:

## 2020-10-05 NOTE — H&P (View-Only) (Signed)
   Subjective:    Patient ID: Richard Evans, male    DOB: 08-Feb-1946, 74 y.o.   MRN: [id number]  HPI  74 yoex heavy smoker for FU of COPD & RLL lung mass He was seen in 2017 forhemoptysis and abnormal CTshowing centrilobular emphysema  PMH -  06/2016 CLL - presented with lymphocytosis,lymphadenopathy and autoimmune hemolytic anemia He was treated with high-dose steroids in 2017 and then Rituxan in 2018  He smoked about a pack per day until 2010 when he quit-about 50-pack-years.  On his last visit 09/12/2020, we set him up with oxygen for hypoxia.  He has been using up to 3 L but continues to desaturate on walking, today he desaturated to 84%.  He has dyspnea on minimal exertion now class III Routine activities such as showering, puts him out of breath.  We discussed PET scan results, his daughter and wife accompanies and he had 2 other daughters on the phone.  We also discussed biopsy results which showed squamous cell carcinoma. Pleural fluid showed lymphocytes suggestive of CLL  He complains of right shoulder pain, this was not relieved by Tylenol, I sent him tramadol and this did not help.  I prescribed Tylenol 3 but he did not fill this instead he took Advil which seems to work better   Significant tests/ events reviewed  CT chest WO contrast 09/2020 >> Enlarging right lower lobe mass with multiple new right lung nodules.  Mediastinal, hilar and upper abdominal adenopathy is grossly stable from 03/07/2020 in this patient with a history of CLL.  New small bilateral pleural effusions, partially loculated and slightly larger on the right    Spirometry1/2018 -no evidence of airway obstruction with FEV1 of 85% and FVC of 81%   CT imaging10/2017showed underlying centrilobular emphysema and right upper lobe airspace disease which was new when compared to his CT from 06/2016 .  CT chest 12/2018Stable to minimal progression of mild mediastinal and hilar  lymphadenopathy Stable to slight decrease in size of the soft tissue lesion adjacent to the distal duodenum.  Review of Systems neg for any significant sore throat, dysphagia, itching, sneezing, nasal congestion or excess/ purulent secretions, fever, chills, sweats, unintended wt loss, pleuritic or exertional cp, hempoptysis, orthopnea pnd or change in chronic leg swelling. Also denies presyncope, palpitations, heartburn, abdominal pain, nausea, vomiting, diarrhea or change in bowel or urinary habits, dysuria,hematuria, rash, arthralgias, visual complaints, headache, numbness weakness or ataxia.     Objective:   Physical Exam  Gen. Pleasant, well-nourished, in no distress ENT - no thrush, no pallor/icterus,no post nasal drip Neck: No JVD, no thyromegaly, no carotid bruits Lungs: no use of accessory muscles, no dullness to percussion, decreased RT base without rales or rhonchi  Cardiovascular: Rhythm regular, heart sounds  normal, no murmurs or gallops, no peripheral edema Musculoskeletal: No deformities, no cyanosis or clubbing        Assessment & Plan:

## 2020-10-05 NOTE — Progress Notes (Signed)
The proposed treatment discussed in cancer conference is for discussion purpose only and is not a binding recommendation. The patient was not physically examined nor present for their treatment options. Therefore, final treatment plans cannot be decided.  ?

## 2020-10-05 NOTE — Patient Instructions (Addendum)
CXR today  Prednisone 10 mg tabs Take 4 tabs  daily with food x 4 days, then 3 tabs daily x 4 days, then 2 tabs daily x 4 days, then 1 tab daily x4 days then stop. #40  Lasix 40 mg daily x 3 days  Increase oxygen to 5L during exertion & 3L at rest Rx for anoro & sample

## 2020-10-05 NOTE — Assessment & Plan Note (Addendum)
Unfortunately this appears to be stage IV with mets to the bone.  while lymphadenopathy in the chest could be related to CLL, bone lesions have to be attributed to non-small cell lung cancer. He has an upcoming appointment with oncology 11/15 to discuss options. He is clear that he would not want chemotherapy especially if side effects would make him worse. He would be acceptable of radiation therapy but I explained to him that this would only be of palliative intent.  His hemoptysis seems to have subsided for now. Pain in the shoulder is the main issue and I wonder if this is radiating pain related to right lower lobe mild lung mass which is irritating the diaphragm.  He did not have mets in his right shoulder.  He does have bone mets in his left shoulder and bilateral iliac bones  Extensive discussion with his wife and daughter in the room and 2 daughters on the phone.  If he is not a candidate for further therapy then would suggest hospice

## 2020-10-05 NOTE — Assessment & Plan Note (Signed)
His hypoxia is rapidly worsening.  Not clear what is causing this.  We will repeat chest x-ray today to see if pleural effusion is increased although this does not seem to be the case on exam. He does not seem to have pedal edema to indicate heart failure.  He does not seem to have any bronchospasm today. Recent CT/PET scan has not shown lymphangitic spread of tumor. I have asked him to increase oxygen to 5 L on exertion and use 2 to 3 L at rest

## 2020-10-06 ENCOUNTER — Emergency Department (HOSPITAL_COMMUNITY)
Admission: EM | Admit: 2020-10-06 | Discharge: 2020-10-06 | Disposition: A | Discharge status: Home / Self Care | Payer: PPO | Attending: Emergency Medicine | Admitting: Emergency Medicine

## 2020-10-06 ENCOUNTER — Other Ambulatory Visit: Payer: Self-pay

## 2020-10-06 ENCOUNTER — Encounter (HOSPITAL_COMMUNITY): Payer: Self-pay

## 2020-10-06 ENCOUNTER — Telehealth: Payer: Self-pay | Admitting: Pulmonary Disease

## 2020-10-06 ENCOUNTER — Emergency Department (HOSPITAL_COMMUNITY): Payer: PPO

## 2020-10-06 DIAGNOSIS — C3491 Malignant neoplasm of unspecified part of right bronchus or lung: Secondary | ICD-10-CM | POA: Diagnosis not present

## 2020-10-06 DIAGNOSIS — Z79899 Other long term (current) drug therapy: Secondary | ICD-10-CM | POA: Insufficient documentation

## 2020-10-06 DIAGNOSIS — R0902 Hypoxemia: Secondary | ICD-10-CM | POA: Diagnosis not present

## 2020-10-06 DIAGNOSIS — R0602 Shortness of breath: Secondary | ICD-10-CM | POA: Insufficient documentation

## 2020-10-06 DIAGNOSIS — Z20822 Contact with and (suspected) exposure to covid-19: Secondary | ICD-10-CM | POA: Insufficient documentation

## 2020-10-06 DIAGNOSIS — I10 Essential (primary) hypertension: Secondary | ICD-10-CM | POA: Diagnosis not present

## 2020-10-06 DIAGNOSIS — J90 Pleural effusion, not elsewhere classified: Secondary | ICD-10-CM | POA: Diagnosis not present

## 2020-10-06 DIAGNOSIS — Z87891 Personal history of nicotine dependence: Secondary | ICD-10-CM | POA: Diagnosis not present

## 2020-10-06 DIAGNOSIS — R0600 Dyspnea, unspecified: Secondary | ICD-10-CM

## 2020-10-06 DIAGNOSIS — R0609 Other forms of dyspnea: Secondary | ICD-10-CM

## 2020-10-06 HISTORY — DX: Malignant neoplasm of unspecified part of right bronchus or lung: C34.91

## 2020-10-06 LAB — CBC WITH DIFFERENTIAL/PLATELET
Abs Immature Granulocytes: 0.32 10*3/uL — ABNORMAL HIGH (ref 0.00–0.07)
Basophils Absolute: 0.1 10*3/uL (ref 0.0–0.1)
Basophils Relative: 1 %
Eosinophils Absolute: 0 10*3/uL (ref 0.0–0.5)
Eosinophils Relative: 0 %
HCT: 35.5 % — ABNORMAL LOW (ref 39.0–52.0)
Hemoglobin: 11.9 g/dL — ABNORMAL LOW (ref 13.0–17.0)
Immature Granulocytes: 4 %
Lymphocytes Relative: 29 %
Lymphs Abs: 2.9 10*3/uL (ref 0.7–4.0)
MCH: 31.3 pg (ref 26.0–34.0)
MCHC: 33.5 g/dL (ref 30.0–36.0)
MCV: 93.4 fL (ref 80.0–100.0)
Monocytes Absolute: 1 10*3/uL (ref 0.1–1.0)
Monocytes Relative: 10 %
Neutro Abs: 5.8 10*3/uL (ref 1.7–7.7)
Neutrophils Relative %: 56 %
Platelets: 161 10*3/uL (ref 150–400)
RBC: 3.8 MIL/uL — ABNORMAL LOW (ref 4.22–5.81)
RDW: 23 % — ABNORMAL HIGH (ref 11.5–15.5)
WBC: 9.4 10*3/uL (ref 4.0–10.5)
nRBC: 0 % (ref 0.0–0.2)

## 2020-10-06 LAB — COMPREHENSIVE METABOLIC PANEL
ALT: 29 U/L (ref 0–44)
AST: 25 U/L (ref 15–41)
Albumin: 4.1 g/dL (ref 3.5–5.0)
Alkaline Phosphatase: 93 U/L (ref 38–126)
Anion gap: 9 (ref 5–15)
BUN: 26 mg/dL — ABNORMAL HIGH (ref 8–23)
CO2: 23 mmol/L (ref 22–32)
Calcium: 9.1 mg/dL (ref 8.9–10.3)
Chloride: 105 mmol/L (ref 98–111)
Creatinine, Ser: 1 mg/dL (ref 0.61–1.24)
GFR, Estimated: >60 mL/min (ref >60)
Glucose, Bld: 248 mg/dL — ABNORMAL HIGH (ref 70–99)
Potassium: 4.1 mmol/L (ref 3.5–5.1)
Sodium: 137 mmol/L (ref 135–145)
Total Bilirubin: 2.2 mg/dL — ABNORMAL HIGH (ref 0.3–1.2)
Total Protein: 7.5 g/dL (ref 6.5–8.1)

## 2020-10-06 LAB — RESPIRATORY PANEL BY RT PCR (FLU A&B, COVID)
Influenza A by PCR: NEGATIVE
Influenza B by PCR: NEGATIVE
SARS Coronavirus 2 by RT PCR: NEGATIVE

## 2020-10-06 NOTE — ED Triage Notes (Signed)
Pt brought in by EMS. Pt had 760 ml of fluid removed off right lung 10 days ago. DX with stage 4 lung cancer in right lung yesterday and was told he had fluid on his lung . Pt has had increase SOB and wears o2 at [facility]

## 2020-10-06 NOTE — Telephone Encounter (Signed)
Spoke with patient's daughter [name]. She stated that she since the last phone call, patient O2 levels have started to drop. He is currently on 5L of O2. He will occasionally take his O2 off in order to move around the house but has not been able to due to his O2 levels dropping to 70%. Even with the O2 on, his levels are struggling to get above 85%. He is extremely fatigued and gasping for air.   Patient is currently scheduled for a thoracentesis on 10/11/20 but she fears this is too long of a wait. She is concerned that she needs to take him to the hospital.   RA is listed as unavailable this afternoon. Aaron Edelman, can you please advise? Thanks!

## 2020-10-06 NOTE — ED Notes (Signed)
PT educated with Dc instructions and verbalized complete understanding and denies questions at this time. PT IV removed fully intact and dressing applied. PT was able to provide home O2 to leave Er via wheelchair to exit with family.

## 2020-10-06 NOTE — Telephone Encounter (Signed)
[name] daughter states patient oxygen levels are low. [name] phone number is [phone].

## 2020-10-06 NOTE — Telephone Encounter (Signed)
10/06/2020  Given patient's symptoms I believe it would be best for the patient to be transported to an emergency room for further evaluation.  Patient may need emergent thoracentesis.  Please also route this message to Dr. Elsworth Soho as Juluis Rainier.  Wyn Quaker, FNP

## 2020-10-06 NOTE — Discharge Instructions (Signed)
Keep your appointment Wednesday for your thoracentesis.  If you get worse over the weekend just go to [facility]

## 2020-10-06 NOTE — Telephone Encounter (Signed)
Richard Evans returning a phone call. Richard Evans can be reached at [phone].

## 2020-10-06 NOTE — ED Provider Notes (Signed)
[facility] EMERGENCY DEPARTMENT Provider Note   CSN: [id number] Arrival date & time: 10/06/20  1814     History Chief Complaint  Patient presents with  . Shortness of Breath    Richard Evans is a 74 y.o. male.  Patient with a pleural effusion.  Patient had lung cancer and had a thoracentesis done couple weeks ago.  He was more short of breath today.  Patient usually uses 3 L of nasal oxygen.  The history is provided by the patient and medical records. No language interpreter was used.  Shortness of Breath Severity:  Moderate Onset quality:  Sudden Timing:  Constant Progression:  Worsening Chronicity:  Recurrent Context: activity   Relieved by:  Nothing Worsened by:  Nothing Ineffective treatments:  None tried Associated symptoms: no abdominal pain, no chest pain, no cough, no headaches and no rash        Past Medical History:  Diagnosis Date  . Hemolytic anemia ([place])   . History of epistaxis    hx of epistaxis on ASA.[facility]  . Hypertension   . Pneumonia   . Squamous cell lung cancer, right ([place])   . Vocal cord nodule    benign    Patient Active Problem List   Diagnosis Date Noted  . Squamous cell lung cancer, right ([place]) 09/12/2020  . Chronic respiratory failure with hypoxia ([place]) 09/12/2020  . Muscle weakness (generalized)   . BPH (benign prostatic hyperplasia) 09/27/2016  . Mediastinal lymphadenopathy 09/24/2016  . Emphysema lung ([place]) 09/24/2016  . Superficial vein thrombosis 07/08/2016  . Cellulitis of right upper arm 07/08/2016  . Other fatigue   . SOB (shortness of breath)   . Essential hypertension   . Elevated bilirubin   . Hyperglycemia   . Leukocytosis   . Splenomegaly   . Jaundice   . Hemolytic anemia ([place]) 06/30/2016  . Symptomatic anemia 06/30/2016  . ANEMIA, MILD 01/02/2011  . BLOOD IN STOOL, OCCULT 12/11/2010    Past Surgical History:  Procedure Laterality Date  . COLONOSCOPY  12/96   Dr.  Rehman>diverticulosis/ external hemorrhoids  . THROAT SURGERY  1977   nodules removed from larnyx       Family History  Problem Relation Age of Onset  . Brain cancer Mother   . Heart attack Father   . Brain cancer Brother   . Cancer - Lung Brother   . Stroke Brother     Social History   Tobacco Use  . Smoking status: Former Smoker    Packs/day: 2.00    Years: 50.00    Pack years: 100.00    Types: Cigarettes    Quit date: 11/30/2009    Years since quitting: 10.8  . Smokeless tobacco: Never Used  Vaping Use  . Vaping Use: Never used  Substance Use Topics  . Alcohol use: No  . Drug use: No    Home Medications Prior to Admission medications   Medication Sig Start Date End Date Taking? Authorizing Provider  atenolol (TENORMIN) 25 MG tablet Take 25 mg by mouth at bedtime.     [provider]  Cholecalciferol (VITAMIN D) 2000 units CAPS Take 2,000 Units by mouth daily.     [provider]  diphenhydrAMINE (BENADRYL) 25 mg capsule Take 25 mg by mouth at bedtime.     [provider]  ELDERBERRY PO Take 1 capsule by mouth daily.     [provider]  folic acid (FOLVITE) 073 MCG tablet Take 800 mcg  by mouth daily.    [provider]  furosemide (LASIX) 40 MG tablet Take 1 tablet (40 mg total) by mouth daily for 3 days. 10/05/20 10/08/20  Rigoberto Noel, MD  ibuprofen (ADVIL) 400 MG tablet Take 400 mg by mouth every 6 (six) hours as needed.    [provider]  predniSONE (DELTASONE) 10 MG tablet Take 4 tablets (40 mg total) by mouth daily with breakfast for 4 days, THEN 3 tablets (30 mg total) daily with breakfast for 4 days, THEN 2 tablets (20 mg total) daily with breakfast for 4 days, THEN 1 tablet (10 mg total) daily with breakfast for 4 days. 10/05/20 10/21/20  Rigoberto Noel, MD  Tamsulosin HCl (FLOMAX) 0.4 MG CAPS Take 0.4 mg by mouth at bedtime.     [provider]  umeclidinium-vilanterol (ANORO ELLIPTA) 62.5-25  MCG/INH AEPB Inhale 1 puff into the lungs daily. 09/12/20   Rigoberto Noel, MD  umeclidinium-vilanterol (ANORO ELLIPTA) 62.5-25 MCG/INH AEPB Inhale 1 puff into the lungs daily. 10/05/20   Rigoberto Noel, MD    Allergies    Anoro ellipta [umeclidinium-vilanterol], Norco [hydrocodone-acetaminophen], and Adhesive [tape]  Review of Systems   Review of Systems  Constitutional: Negative for appetite change and fatigue.  HENT: Negative for congestion, ear discharge and sinus pressure.   Eyes: Negative for discharge.  Respiratory: Positive for shortness of breath. Negative for cough.   Cardiovascular: Negative for chest pain.  Gastrointestinal: Negative for abdominal pain and diarrhea.  Genitourinary: Negative for frequency and hematuria.  Musculoskeletal: Negative for back pain.  Skin: Negative for rash.  Neurological: Negative for seizures and headaches.  Psychiatric/Behavioral: Negative for hallucinations.    Physical Exam Updated Vital Signs BP (!) 155/82 (BP Location: Right Arm)   Pulse 73   Temp 98 F (36.7 C) (Oral)   Resp 15   Wt 84.4 kg   SpO2 94%   BMI 25.23 kg/m   Physical Exam Vitals and nursing note reviewed.  Constitutional:      Appearance: He is well-developed.  HENT:     Head: Normocephalic.     Nose: Nose normal.  Eyes:     General: No scleral icterus.    Conjunctiva/sclera: Conjunctivae normal.  Neck:     Thyroid: No thyromegaly.  Cardiovascular:     Rate and Rhythm: Normal rate and regular rhythm.     Heart sounds: No murmur heard.  No friction rub. No gallop.   Pulmonary:     Breath sounds: No stridor. No wheezing or rales.  Chest:     Chest wall: No tenderness.  Abdominal:     General: There is no distension.     Tenderness: There is no abdominal tenderness. There is no rebound.  Musculoskeletal:        General: Normal range of motion.     Cervical back: Neck supple.  Lymphadenopathy:     Cervical: No cervical adenopathy.  Skin:    Findings:  No erythema or rash.  Neurological:     Mental Status: He is alert and oriented to person, place, and time.     Motor: No abnormal muscle tone.     Coordination: Coordination normal.  Psychiatric:        Behavior: Behavior normal.     ED Results / Procedures / Treatments   Labs (all labs ordered are listed, but only abnormal results are displayed) Labs Reviewed  CBC WITH DIFFERENTIAL/PLATELET - Abnormal; Notable for the following components:  Result Value   RBC 3.80 (*)    Hemoglobin 11.9 (*)    HCT 35.5 (*)    RDW 23.0 (*)    Abs Immature Granulocytes 0.32 (*)    All other components within normal limits  COMPREHENSIVE METABOLIC PANEL - Abnormal; Notable for the following components:   Glucose, Bld 248 (*)    BUN 26 (*)    Total Bilirubin 2.2 (*)    All other components within normal limits  RESPIRATORY PANEL BY RT PCR (FLU A&B, COVID)    EKG None  Radiology DG Chest 2 View  Result Date: 10/05/2020 CLINICAL DATA:  Dyspnea.  Emphysema. EXAM: CHEST - 2 VIEW COMPARISON:  09/25/2020 FINDINGS: Cardiac and mediastinal contours normal.  Vascularity normal. Interval development of right pleural effusion with mild right lower lobe airspace disease. Left lung clear. Scarring in the right apex. IMPRESSION: Interval development of right pleural effusion and mild right lower lobe airspace disease. Previously noted mass in the right lung base obscured by fluid. Electronically Signed   By: Franchot Gallo M.D.   On: 10/05/2020 11:24   DG Chest Port 1 View  Result Date: 10/06/2020 CLINICAL DATA:  Shortness of breath, history of lung carcinoma and recent right thoracentesis EXAM: PORTABLE CHEST 1 VIEW COMPARISON:  10/05/2020 FINDINGS: Cardiac shadow is stable. Right-sided pleural effusion is again identified similar to that seen on the prior exam. The known right basilar mass is not well visualized due to the underlying fluid. Left lung remains clear. No bony abnormality is seen.  IMPRESSION: Stable right-sided pleural effusion. The known basilar mass is not well appreciated on this exam. Electronically Signed   By: Inez Catalina M.D.   On: 10/06/2020 19:30    Procedures Procedures (including critical care time)  Medications Ordered in ED Medications - No data to display  ED Course  I have reviewed the triage vital signs and the nursing notes.  Pertinent labs & imaging results that were available during my care of the patient were reviewed by me and considered in my medical decision making (see chart for details).    MDM Rules/Calculators/A&P                          Patient with pleural effusion.  Not any different than yesterday.  He is tolerating 4 L nasal oxygen with sats over 95%.  We will discharge him home and he will have a thoracentesis done on Wednesday.  Patient was instructed if he gets worse he should return to [facility] over the weekend      This patient presents to the ED for concern of shortness of breath this involves an extensive number of treatment options, and is a complaint that carries with it a high risk of complications and morbidity.  The differential diagnosis includes pleural effusion   Lab Tests:   I Ordered, reviewed, and interpreted labs, which include CBC chemistries hemoglobin mildly low  Medicines ordered:   I ordered medication oxygen  Imaging Studies ordered:   I ordered imaging studies which included chest x-ray  I independently visualized and interpreted imaging which showed pleural effusion no significant change  Additional history obtained:   Additional history obtained from record  Previous records obtained and reviewed.  Consultations Obtained:     Reevaluation:  After the interventions stated above, I reevaluated the patient and found mild improvement  Critical Interventions:  .   Final Clinical Impression(s) / ED Diagnoses Final  diagnoses:  Dyspnea on exertion    Rx / DC  Orders ED Discharge Orders    None       Milton Ferguson, MD 10/08/20 1014

## 2020-10-06 NOTE — Telephone Encounter (Signed)
Fluid collection has increased around right chest. Please arrange for thoracentesis next Wednesday 11/10 at endoscopy [facility] by me  Patient is aware of results and is agreeable with thoracentesis.   I have spoken to [place] with [facility] and scheduled thoracentesis for 10/11/2020 at 2:00. Patient is aware of date/time and voiced his understanding.   Routing to Dr. Elsworth Soho as an Juluis Rainier.

## 2020-10-06 NOTE — Telephone Encounter (Signed)
Spoke with Richard Evans. She stated that she will talk with the patient and see if he would be willing to come to either [facility] or [facility] ED. Advised her to keep [place] updated. She verbalized understanding.

## 2020-10-06 NOTE — Progress Notes (Signed)
Richard Evans spoke with the pt already and notified of results and thora is scheduled.

## 2020-10-06 NOTE — Telephone Encounter (Signed)
LMTCB for [employer]

## 2020-10-09 ENCOUNTER — Telehealth: Payer: Self-pay | Admitting: Pulmonary Disease

## 2020-10-09 NOTE — Telephone Encounter (Signed)
I have reviewed EKG. He has right bundle branch block which is unchanged from his EKG in 2017. No change in plan

## 2020-10-09 NOTE — Telephone Encounter (Signed)
Called and spoke with Richard Evans letting her know the info stated by RA and she verbalized understanding. Nothing further needed.

## 2020-10-09 NOTE — Telephone Encounter (Signed)
Called and spoke with pt's daughter Richard Evans who stated she took pt to the ED 11/5 after calling the office 11/5 as it was recommended for pt to be taken to ED for eval. Pt had an EKG performed that they would like for Dr. Elsworth Soho to review as they are a little concerned about the EKG.  Dr. Elsworth Soho, please advise.

## 2020-10-11 ENCOUNTER — Encounter (HOSPITAL_COMMUNITY)
Admission: RE | Disposition: A | Discharge status: Home / Self Care | Payer: Self-pay | Source: Home / Self Care | Attending: Pulmonary Disease

## 2020-10-11 ENCOUNTER — Encounter (HOSPITAL_COMMUNITY): Payer: Self-pay | Admitting: Pulmonary Disease

## 2020-10-11 ENCOUNTER — Other Ambulatory Visit: Payer: Self-pay

## 2020-10-11 ENCOUNTER — Ambulatory Visit (HOSPITAL_COMMUNITY)
Admission: RE | Admit: 2020-10-11 | Discharge: 2020-10-11 | Disposition: A | Discharge status: Home / Self Care | Payer: PPO | Attending: Pulmonary Disease | Admitting: Pulmonary Disease

## 2020-10-11 ENCOUNTER — Ambulatory Visit (HOSPITAL_COMMUNITY): Payer: PPO

## 2020-10-11 DIAGNOSIS — I700 Atherosclerosis of aorta: Secondary | ICD-10-CM | POA: Diagnosis not present

## 2020-10-11 DIAGNOSIS — Z9889 Other specified postprocedural states: Secondary | ICD-10-CM

## 2020-10-11 DIAGNOSIS — J90 Pleural effusion, not elsewhere classified: Secondary | ICD-10-CM | POA: Diagnosis not present

## 2020-10-11 DIAGNOSIS — J948 Other specified pleural conditions: Secondary | ICD-10-CM | POA: Diagnosis not present

## 2020-10-11 DIAGNOSIS — R21 Rash and other nonspecific skin eruption: Secondary | ICD-10-CM | POA: Diagnosis not present

## 2020-10-11 DIAGNOSIS — J432 Centrilobular emphysema: Secondary | ICD-10-CM | POA: Diagnosis not present

## 2020-10-11 DIAGNOSIS — C3431 Malignant neoplasm of lower lobe, right bronchus or lung: Secondary | ICD-10-CM | POA: Insufficient documentation

## 2020-10-11 DIAGNOSIS — Z87891 Personal history of nicotine dependence: Secondary | ICD-10-CM | POA: Insufficient documentation

## 2020-10-11 DIAGNOSIS — Z856 Personal history of leukemia: Secondary | ICD-10-CM | POA: Diagnosis not present

## 2020-10-11 DIAGNOSIS — M25511 Pain in right shoulder: Secondary | ICD-10-CM | POA: Insufficient documentation

## 2020-10-11 DIAGNOSIS — Z9981 Dependence on supplemental oxygen: Secondary | ICD-10-CM | POA: Diagnosis not present

## 2020-10-11 DIAGNOSIS — D72820 Lymphocytosis (symptomatic): Secondary | ICD-10-CM | POA: Insufficient documentation

## 2020-10-11 HISTORY — PX: THORACENTESIS: SHX235

## 2020-10-11 LAB — BODY FLUID CELL COUNT WITH DIFFERENTIAL
Eos, Fluid: 0 %
Lymphs, Fluid: 34 %
Monocyte-Macrophage-Serous Fluid: 65 % (ref 50–90)
Neutrophil Count, Fluid: 1 % (ref 0–25)
Total Nucleated Cell Count, Fluid: 3450 cu mm — ABNORMAL HIGH (ref 0–1000)

## 2020-10-11 LAB — LACTATE DEHYDROGENASE, PLEURAL OR PERITONEAL FLUID: LD, Fluid: 245 U/L — ABNORMAL HIGH (ref 3–23)

## 2020-10-11 LAB — PROTEIN, PLEURAL OR PERITONEAL FLUID: Total protein, fluid: 4.4 g/dL

## 2020-10-11 SURGERY — THORACENTESIS
Anesthesia: Moderate Sedation

## 2020-10-11 MED ORDER — LIDOCAINE HCL (PF) 1 % IJ SOLN
INTRAMUSCULAR | Status: DC | PRN
  Administered 2020-10-11: 10 mL

## 2020-10-11 MED ORDER — LIDOCAINE HCL (PF) 1 % IJ SOLN
INTRAMUSCULAR | Status: AC
  Filled 2020-10-11: qty 30

## 2020-10-11 NOTE — Interval H&P Note (Signed)
History and Physical Interval Note:  10/11/2020 2:58 PM  Richard Evans  has presented today for surgery, with the diagnosis of pleural effusion.  The various methods of treatment have been discussed with the patient and family. After consideration of risks, benefits and other options for treatment, the patient has consented to  Procedure(s): THORACENTESIS (N/A) as a surgical intervention.  The patient's history has been reviewed, patient examined, no change in status, stable for surgery.  I have reviewed the patient's chart and labs.  Questions were answered to the patient's satisfaction.     [name] Elsworth Soho

## 2020-10-11 NOTE — Progress Notes (Signed)
Time out completed at 1429

## 2020-10-11 NOTE — Procedures (Signed)
Thoracentesis  Procedure Note  [name]  [id number]  1946/05/09  Date:10/11/20  Time:3:03 PM   Provider Performing:Amiel Mccaffrey V. Zainab Crumrine   Procedure: Thoracentesis with imaging guidance (75883)  Indication(s) Pleural Effusion  Consent Risks of the procedure as well as the alternatives and risks of each were explained to the patient and/or caregiver.  Consent for the procedure was obtained and is signed in the bedside chart  Anesthesia Topical only with 1% lidocaine    Time Out Verified patient identification, verified procedure, site/side was marked, verified correct patient position, special equipment/implants available, medications/allergies/relevant history reviewed, required imaging and test results available.   Sterile Technique Maximal sterile technique including full sterile barrier drape, hand hygiene, sterile gown, sterile gloves, mask, hair covering, sterile ultrasound probe cover (if used).  Procedure Description Ultrasound was used to identify appropriate pleural anatomy for placement and overlying skin marked.  See pic in media section.  Area of drainage cleaned and draped in sterile fashion. Lidocaine was used to anesthetize the skin and subcutaneous tissue.  1100 cc's of amber yellow, blood tinged appearing fluid was drained from the right pleural space. Catheter then removed and bandaid applied to site.   Complications/Tolerance None; patient tolerated the procedure well. Chest X-ray is ordered to confirm no post-procedural complication.   EBL Minimal   Specimen(s) Pleural fluid  Richard Evans V. Elsworth Soho MD

## 2020-10-11 NOTE — Progress Notes (Signed)
Spoke with Dr Elsworth Soho, pt cxr ok, pt may be discharged home, incision site to right mid, outer back checked and wnl, minimal sanguinous drainage noted, area pinkened, old band-aid removed, site cleaned with chloraprep and new band-aid applied

## 2020-10-14 DIAGNOSIS — R918 Other nonspecific abnormal finding of lung field: Secondary | ICD-10-CM | POA: Diagnosis not present

## 2020-10-14 DIAGNOSIS — J9611 Chronic respiratory failure with hypoxia: Secondary | ICD-10-CM | POA: Diagnosis not present

## 2020-10-14 LAB — BODY FLUID CULTURE: Culture: NO GROWTH

## 2020-10-14 LAB — CHOLESTEROL, BODY FLUID: Cholesterol, Fluid: 47 mg/dL

## 2020-10-16 ENCOUNTER — Other Ambulatory Visit: Payer: Self-pay | Admitting: Oncology

## 2020-10-16 ENCOUNTER — Inpatient Hospital Stay: Payer: PPO | Attending: Oncology | Admitting: Oncology

## 2020-10-16 ENCOUNTER — Other Ambulatory Visit: Payer: Self-pay

## 2020-10-16 VITALS — BP 145/61 | HR 64 | Temp 97.6°F | Resp 18 | Ht 72.0 in | Wt 185.6 lb

## 2020-10-16 DIAGNOSIS — J439 Emphysema, unspecified: Secondary | ICD-10-CM | POA: Diagnosis not present

## 2020-10-16 DIAGNOSIS — Z885 Allergy status to narcotic agent status: Secondary | ICD-10-CM | POA: Diagnosis not present

## 2020-10-16 DIAGNOSIS — C7951 Secondary malignant neoplasm of bone: Secondary | ICD-10-CM | POA: Insufficient documentation

## 2020-10-16 DIAGNOSIS — C9110 Chronic lymphocytic leukemia of B-cell type not having achieved remission: Secondary | ICD-10-CM | POA: Diagnosis not present

## 2020-10-16 DIAGNOSIS — Z923 Personal history of irradiation: Secondary | ICD-10-CM | POA: Diagnosis not present

## 2020-10-16 DIAGNOSIS — Z888 Allergy status to other drugs, medicaments and biological substances status: Secondary | ICD-10-CM | POA: Insufficient documentation

## 2020-10-16 DIAGNOSIS — R161 Splenomegaly, not elsewhere classified: Secondary | ICD-10-CM | POA: Diagnosis not present

## 2020-10-16 DIAGNOSIS — J90 Pleural effusion, not elsewhere classified: Secondary | ICD-10-CM | POA: Insufficient documentation

## 2020-10-16 DIAGNOSIS — I700 Atherosclerosis of aorta: Secondary | ICD-10-CM | POA: Diagnosis not present

## 2020-10-16 DIAGNOSIS — C8590 Non-Hodgkin lymphoma, unspecified, unspecified site: Secondary | ICD-10-CM

## 2020-10-16 DIAGNOSIS — Z9981 Dependence on supplemental oxygen: Secondary | ICD-10-CM | POA: Insufficient documentation

## 2020-10-16 DIAGNOSIS — D591 Autoimmune hemolytic anemia, unspecified: Secondary | ICD-10-CM | POA: Insufficient documentation

## 2020-10-16 DIAGNOSIS — Z7189 Other specified counseling: Secondary | ICD-10-CM

## 2020-10-16 DIAGNOSIS — K4090 Unilateral inguinal hernia, without obstruction or gangrene, not specified as recurrent: Secondary | ICD-10-CM | POA: Diagnosis not present

## 2020-10-16 DIAGNOSIS — C3491 Malignant neoplasm of unspecified part of right bronchus or lung: Secondary | ICD-10-CM | POA: Diagnosis not present

## 2020-10-16 DIAGNOSIS — Z79899 Other long term (current) drug therapy: Secondary | ICD-10-CM | POA: Insufficient documentation

## 2020-10-16 LAB — CYTOLOGY - NON PAP

## 2020-10-16 MED ORDER — ANORO ELLIPTA 62.5-25 MCG/INH IN AEPB: 1.0000 | INHALATION_SPRAY | Freq: Every day | RESPIRATORY_TRACT | 2 refills | Status: AC

## 2020-10-16 NOTE — Progress Notes (Signed)
Hematology and Oncology Follow Up Visit  Richard Evans [id number] 1946/07/16 74 y.o. 10/16/2020 9:57 AM [name], MDVyas, [name], MD   Principle Diagnosis: 74 year old man with:  1. CLL presented with lymphocytosis, adenopathy and autoimmune hemolytic anemia in 2017.    2. Stage IV squamous cell carcinoma of the lung documented in October 2021. He was found to have right lung mass with multifocal neoplasm lymphadenopathy as well as bone involvement. Molecular testing showed no PD-L1 expression.    Prior Therapy:    Prednisone 70 mg daily started on 06/30/2016. He achieved complete response and prednisone tapered completely off on 09/11/2016. He relapsed in October 2017.  Prednisone 90 mg daily started on 09/30/2016. He achieved complete response on 10/17/2016. He completed prednisone taper on 12/18/2016.  Rituximab at 375 mg/m weekly started on 08/08/2017. He is status post 4 weeks of therapy completed on 08/27/2017.  Prednisone 60 mg daily started on 09/17/2017.  He was tapered off in December 2018 after achieving a complete response.  He is status post CT-guided lung biopsy and thoracentesis on 09/25/2020.  Current therapy: Under consideration to start systemic therapy.    Interim History:  Richard Evans presents today for a follow-up visit. Since the last visit, he reports overall stable respiratory status.  He was prescribed oral prednisone which she has helped his symptoms.  He denies any nausea, vomiting or abdominal pain.  He denies any dyspnea at rest.  He continues to be oxygen dependent although he has limited mobility because of dyspnea.  He does report left-sided hip discomfort.  His appetite remained reasonable at this time.  .           Medications: Updated on review. Current Outpatient Medications  Medication Sig Dispense Refill  . atenolol (TENORMIN) 25 MG tablet Take 25 mg by mouth at bedtime.     . Cholecalciferol (VITAMIN D) 2000 units CAPS Take  2,000 Units by mouth daily.     . diphenhydrAMINE (BENADRYL) 25 mg capsule Take 25 mg by mouth at bedtime.     Marland Kitchen ELDERBERRY PO Take 1 capsule by mouth daily.     . folic acid (FOLVITE) 443 MCG tablet Take 800 mcg by mouth daily.    . furosemide (LASIX) 40 MG tablet Take 1 tablet (40 mg total) by mouth daily for 3 days. 3 tablet 0  . glipiZIDE (GLUCOTROL XL) 5 MG 24 hr tablet Take 5 mg by mouth daily.    Marland Kitchen ibuprofen (ADVIL) 400 MG tablet Take 400 mg by mouth in the morning and at bedtime.     . predniSONE (DELTASONE) 10 MG tablet Take 4 tablets (40 mg total) by mouth daily with breakfast for 4 days, THEN 3 tablets (30 mg total) daily with breakfast for 4 days, THEN 2 tablets (20 mg total) daily with breakfast for 4 days, THEN 1 tablet (10 mg total) daily with breakfast for 4 days. 40 tablet 0  . Tamsulosin HCl (FLOMAX) 0.4 MG CAPS Take 0.4 mg by mouth at bedtime.     Marland Kitchen umeclidinium-vilanterol (ANORO ELLIPTA) 62.5-25 MCG/INH AEPB Inhale 1 puff into the lungs daily. (Patient not taking: Reported on 10/10/2020) 14 each 0  . umeclidinium-vilanterol (ANORO ELLIPTA) 62.5-25 MCG/INH AEPB Inhale 1 puff into the lungs daily. 60 each 0   Current Facility-Administered Medications  Medication Dose Route Frequency Provider Last Rate Last Admin  . acetaminophen-codeine (TYLENOL #3) 300-30 MG per tablet 1 tablet  1 tablet Oral Q8H PRN Rigoberto Noel, MD  Allergies:  Allergies  Allergen Reactions  . Norco [Hydrocodone-Acetaminophen] Other (See Comments)    Pt becomes fidgety & it does not help with pain.  . Adhesive [Tape] Itching and Rash    Paper tape or wrapped bandage is preferred      Physical Exam:       Blood pressure (!) 145/61, pulse 64, temperature 97.6 F (36.4 C), temperature source Tympanic, resp. rate 18, height 6' (1.829 m), weight 185 lb 9.6 oz (84.2 kg), SpO2 94 %.    ECOG: 1     General appearance: Comfortable appearing without any discomfort Head: Normocephalic  without any trauma Oropharynx: Mucous membranes are moist and pink without any thrush or ulcers. Eyes: Pupils are equal and round reactive to light. Lymph nodes: No cervical, supraclavicular, inguinal or axillary lymphadenopathy.   Heart:regular rate and rhythm.  S1 and S2 without leg edema. Lung: Clear without any rhonchi or wheezes.  No dullness to percussion. Abdomin: Soft, nontender, nondistended with good bowel sounds.  No hepatosplenomegaly. Musculoskeletal: No joint deformity or effusion.  Full range of motion noted. Neurological: No deficits noted on motor, sensory and deep tendon reflex exam. Skin: No petechial rash or dryness.  Appeared moist.  s.          Lab Results: Lab Results  Component Value Date   WBC 9.4 10/06/2020   HGB 11.9 (L) 10/06/2020   HCT 35.5 (L) 10/06/2020   MCV 93.4 10/06/2020   PLT 161 10/06/2020     Chemistry      Component Value Date/Time   NA 137 10/06/2020 1847   NA 141 12/03/2017 0741   K 4.1 10/06/2020 1847   K 4.1 12/03/2017 0741   CL 105 10/06/2020 1847   CO2 23 10/06/2020 1847   CO2 22 12/03/2017 0741   BUN 26 (H) 10/06/2020 1847   BUN 22.1 12/03/2017 0741   CREATININE 1.00 10/06/2020 1847   CREATININE 1.15 03/07/2020 1124   CREATININE 1.1 12/03/2017 0741      Component Value Date/Time   CALCIUM 9.1 10/06/2020 1847   CALCIUM 9.3 12/03/2017 0741   ALKPHOS 93 10/06/2020 1847   ALKPHOS 66 12/03/2017 0741   AST 25 10/06/2020 1847   AST 25 03/07/2020 1124   AST 16 12/03/2017 0741   ALT 29 10/06/2020 1847   ALT 24 03/07/2020 1124   ALT 22 12/03/2017 0741   BILITOT 2.2 (H) 10/06/2020 1847   BILITOT 2.2 (H) 03/07/2020 1124   BILITOT 1.01 12/03/2017 0741       IMPRESSION: 1. Intensely FDG avid mass in the RIGHT lung base with adjacent nodule also FDG avid, most suspicious for multifocal bronchogenic neoplasm based on the discrepant FDG uptake that is seen within enlarged lymph nodes in the chest. Referral to multi  disciplinary thoracic oncologic setting and consideration for biopsy is suggested. 2. Findings suspicious for malignant effusion in the RIGHT chest as described. Many visible small pleural based nodules are not FDG avid but there is a focus of increased FDG uptake in the dependent aspect of the effusion near the costodiaphragmatic sulcus. 3. Increased FDG uptake within the subcarinal lymph node as compared other lymph nodes. This is nonspecific, this could represent nodal involvement from primary pulmonary neoplasm or more profound nodal involvement with lymphoproliferative process. 4. Signs of bony involvement most notably in the RIGHT posterior iliac with area of defined sclerosis and increased FDG uptake new from previous imaging. 5. Mild splenic enlargement with FDG uptake slightly less than adjacent liver.  6. Diverticulosis, LEFT inguinal hernia and signs of cholecystectomy.  Aortic Atherosclerosis (ICD10-I70.0) and Emphysema (ICD10-J43.9). G ROSS AND MICROSCOPIC INFORMATION  Impression and Plan:   74 year old man with:   1. CLL diagnosed in 2017 after presenting with autoimmune hemolytic anemia and adenopathy.  He has been on active surveillance without any indication for treatment. I recommended continuing to do so given his recent lung cancer diagnosis.    2.  Right lower lobe lung neoplasm: PET CT scan showed extensive disease including right lung base with adjacent nodule in addition to bony involvement occluding the right posterior iliac area. He also has developed a recurrent pleural effusion which has required recurrent thoracentesis.  The natural course of this disease was reviewed and treatment options were discussed. Given that we are dealing with stage IV disease with bone involvement as well as pleural effusion I recommend proceeding with systemic chemotherapy. Given his histology of squamous cell carcinoma that is PD-L1 not expressed systemic chemotherapy  with Pembrolizumab is recommended.  Options of chemotherapy backbone include carboplatin paclitaxel among others. Complications associated with this treatments include nausea, vomiting, myelosuppression, neutropenia possible sepsis. Alternative treatment option would be chemotherapy alone without Pembrolizumab versus supportive care only. Positive radiation therapy would likely not address his recurrent pleural effusion and would be considered inferior.  After discussion today, he will think about this option and let me know in the near future.  2. Autoimmune hemolytic anemia: Continues to be well compensated at this time with hemoglobin is adequate.    3. Growth factor support: He will require growth factor support after each cycle of chemotherapy given his risk of neutropenia.  4. IV access risks and benefits of a Port-A-Cath insertion were discussed. Complications that include bleeding, infection and thrombosis were reiterated.  5. Antiemetics: Prescription for nausea medication will be available to him.    6. Follow-up: In the next few weeks to start chemotherapy once he decides on proceeding.  30 minutes were spent on this encounter. The time was dedicated to reviewing his disease status, discussing treatment options and future plan of care review.  Zola Button, MD 11/15/20219:57 AM

## 2020-10-16 NOTE — Progress Notes (Signed)
ALERT: A disease instance has been permanently removed from this patient's pathway record and replaced with a new disease instance. Information on the new disease instance will be transmitted in a separate message.  Disease Being Removed: [Other Dx]  Reason for Removal: Reason not listed 

## 2020-10-16 NOTE — Progress Notes (Signed)
START ON PATHWAY REGIMEN - Non-Small Cell Lung     A cycle is every 21 days:     Pembrolizumab      Paclitaxel      Carboplatin   **Always confirm dose/schedule in your pharmacy ordering system**  Patient Characteristics: Stage IV Metastatic, Squamous, PS = 2, First Line, PD-L1 Expression Positive 1-49% (TPS) / Negative / Not Tested / Awaiting Test Results Therapeutic Status: Stage IV Metastatic Histology: Squamous Cell Line of therapy: First Line ECOG Performance Status: 2 PD-L1 Expression Status: PD-L1 Negative  Intent of Therapy: Non-Curative / Palliative Intent, Discussed with Patient

## 2020-10-19 DIAGNOSIS — C7951 Secondary malignant neoplasm of bone: Secondary | ICD-10-CM | POA: Diagnosis not present

## 2020-10-19 DIAGNOSIS — I10 Essential (primary) hypertension: Secondary | ICD-10-CM | POA: Diagnosis not present

## 2020-10-19 DIAGNOSIS — C3491 Malignant neoplasm of unspecified part of right bronchus or lung: Secondary | ICD-10-CM | POA: Diagnosis not present

## 2020-10-19 DIAGNOSIS — E1165 Type 2 diabetes mellitus with hyperglycemia: Secondary | ICD-10-CM | POA: Diagnosis not present

## 2020-10-19 DIAGNOSIS — Z299 Encounter for prophylactic measures, unspecified: Secondary | ICD-10-CM | POA: Diagnosis not present

## 2020-10-19 DIAGNOSIS — D589 Hereditary hemolytic anemia, unspecified: Secondary | ICD-10-CM | POA: Diagnosis not present

## 2020-10-23 DIAGNOSIS — D589 Hereditary hemolytic anemia, unspecified: Secondary | ICD-10-CM | POA: Diagnosis not present

## 2020-10-23 DIAGNOSIS — Z299 Encounter for prophylactic measures, unspecified: Secondary | ICD-10-CM | POA: Diagnosis not present

## 2020-10-23 DIAGNOSIS — Z2821 Immunization not carried out because of patient refusal: Secondary | ICD-10-CM | POA: Diagnosis not present

## 2020-10-23 DIAGNOSIS — J432 Centrilobular emphysema: Secondary | ICD-10-CM | POA: Diagnosis not present

## 2020-10-23 DIAGNOSIS — I10 Essential (primary) hypertension: Secondary | ICD-10-CM | POA: Diagnosis not present

## 2020-10-23 DIAGNOSIS — C7951 Secondary malignant neoplasm of bone: Secondary | ICD-10-CM | POA: Diagnosis not present

## 2020-10-23 DIAGNOSIS — C9110 Chronic lymphocytic leukemia of B-cell type not having achieved remission: Secondary | ICD-10-CM | POA: Diagnosis not present

## 2020-10-30 DIAGNOSIS — M542 Cervicalgia: Secondary | ICD-10-CM | POA: Diagnosis not present

## 2020-10-30 DIAGNOSIS — M25511 Pain in right shoulder: Secondary | ICD-10-CM | POA: Diagnosis not present

## 2020-10-30 DIAGNOSIS — Z85118 Personal history of other malignant neoplasm of bronchus and lung: Secondary | ICD-10-CM | POA: Diagnosis not present

## 2020-10-30 DIAGNOSIS — J984 Other disorders of lung: Secondary | ICD-10-CM | POA: Diagnosis not present

## 2020-10-30 DIAGNOSIS — M2578 Osteophyte, vertebrae: Secondary | ICD-10-CM | POA: Diagnosis not present

## 2020-10-30 DIAGNOSIS — M50322 Other cervical disc degeneration at C5-C6 level: Secondary | ICD-10-CM | POA: Diagnosis not present

## 2020-10-30 DIAGNOSIS — M47812 Spondylosis without myelopathy or radiculopathy, cervical region: Secondary | ICD-10-CM | POA: Diagnosis not present

## 2020-10-31 DIAGNOSIS — J449 Chronic obstructive pulmonary disease, unspecified: Secondary | ICD-10-CM | POA: Diagnosis not present

## 2020-10-31 DIAGNOSIS — J90 Pleural effusion, not elsewhere classified: Secondary | ICD-10-CM | POA: Diagnosis not present

## 2020-10-31 DIAGNOSIS — R0602 Shortness of breath: Secondary | ICD-10-CM | POA: Diagnosis not present

## 2020-11-07 ENCOUNTER — Ambulatory Visit (INDEPENDENT_AMBULATORY_CARE_PROVIDER_SITE_OTHER): Payer: PPO

## 2020-11-07 ENCOUNTER — Other Ambulatory Visit: Payer: Self-pay

## 2020-11-07 ENCOUNTER — Ambulatory Visit: Payer: PPO | Admitting: Pulmonary Disease

## 2020-11-07 ENCOUNTER — Encounter: Payer: Self-pay | Admitting: Pulmonary Disease

## 2020-11-07 VITALS — BP 144/80 | HR 82 | Temp 98.3°F | Ht 72.0 in | Wt 189.1 lb

## 2020-11-07 DIAGNOSIS — J90 Pleural effusion, not elsewhere classified: Secondary | ICD-10-CM | POA: Diagnosis not present

## 2020-11-07 DIAGNOSIS — C3491 Malignant neoplasm of unspecified part of right bronchus or lung: Secondary | ICD-10-CM

## 2020-11-07 DIAGNOSIS — J9611 Chronic respiratory failure with hypoxia: Secondary | ICD-10-CM

## 2020-11-07 NOTE — H&P (View-Only) (Signed)
   Subjective:    Patient ID: Richard Evans, male    DOB: 1946-02-28, 74 y.o.   MRN: [id number]  HPI  74yoex heavy smokerfor FU of COPD, chronic hypoxic respiratory failure & RLL lung cancer He was seen in 2017 forhemoptysis and abnormal CTshowing centrilobular emphysema  PMH -06/2016 CLL - presented with lymphocytosis,lymphadenopathy andautoimmune hemolytic anemia He was treated with high-dose steroids in 2017 and then Rituxan in 2018  He smoked about a pack per day until 2010 when he quit-about 50-pack-years.  Chief Complaint  Patient presents with  . Follow-up    1 month follow up   anoro was $45 dollars per month.  pt is c/o lots of fluid built up and he cannot lay down to sleep.    We started him on oxygen October 2021. Last office visit I gave him samples of Anoro and prednisone taper.  He underwent thoracentesis of RT effusion , cytology showed mesothelial cells and lymphocytosis consistent with CLL on flow cytometry.  Breathing improved after thoracentesis, however he had repeat chest x-ray done with his PCP which I reviewed 11/30 which shows reaccumulation of right effusion.  I reviewed oncology note -discussion was about chemotherapy and he absolutely does not want to go through that again. He was prescribed oxycodone 5 mg twice daily and prednisone 40 mg by his PCP and he has been taking this with some relief.  Arrives in a wheelchair today accompanied by his wife and daughter who is an EMT.  Continues to complain of pain right lower line area and also in his right upper shoulder blade  Significant tests/ events reviewed  CT chest WO contrast 09/2020 >>Enlarging right lower lobe mass with multiple new right lung nodules.  Mediastinal, hilar and upper abdominal adenopathy is grossly stable from 03/07/2020 in this patient with a history of CLL. New small bilateral pleural effusions, partially loculated and slightly larger on the right    Spirometry1/2018  -no evidence of airway obstruction with FEV1 of 85% and FVC of 81%   CT imaging10/2017showed underlying centrilobular emphysema and right upper lobe airspace disease which was new when compared to his CT from 06/2016 .  CT chest 12/2018Stable to minimal progression of mild mediastinal and hilar lymphadenopathy Stable to slight decrease in size of the soft tissue lesion adjacent to the distal duodenum.  Review of Systems neg for any significant sore throat, dysphagia, itching, sneezing, nasal congestion or excess/ purulent secretions, fever, chills, sweats, unintended wt loss, pleuritic or exertional cp, hempoptysis, orthopnea pnd or change in chronic leg swelling. Also denies presyncope, palpitations, heartburn, abdominal pain, nausea, vomiting, diarrhea or change in bowel or urinary habits, dysuria,hematuria, rash, arthralgias, visual complaints, headache, numbness weakness or ataxia.     Objective:   Physical Exam  Gen. Pleasant, well-nourished, in no distress ENT - no thrush, no pallor/icterus,no post nasal drip Neck: No JVD, no thyromegaly, no carotid bruits Lungs: no use of accessory muscles, no dullness to percussion, decreased right base without rales or rhonchi  Cardiovascular: Rhythm regular, heart sounds  normal, no murmurs or gallops, no peripheral edema Musculoskeletal: No deformities, no cyanosis or clubbing , point tenderness over right ribs axillary line       Assessment & Plan:

## 2020-11-07 NOTE — Assessment & Plan Note (Signed)
Continue oxygen during exertion and rest 2 to 4 L

## 2020-11-07 NOTE — Patient Instructions (Signed)
Chest x-ray today. Based on this we will decide to place a Pleurx catheter on Friday. We will check if you qualify for insurance requirements

## 2020-11-07 NOTE — Progress Notes (Signed)
   Subjective:    Patient ID: Richard Evans, male    DOB: [birth year], 74 y.o.   MRN: [id number]  HPI  74yoex heavy smokerfor FU of COPD, chronic hypoxic respiratory failure & RLL lung cancer He was seen in 2017 forhemoptysis and abnormal CTshowing centrilobular emphysema  PMH -06/2016 CLL - presented with lymphocytosis,lymphadenopathy andautoimmune hemolytic anemia He was treated with high-dose steroids in 2017 and then Rituxan in 2018  He smoked about a pack per day until 2010 when he quit-about 50-pack-years.  Chief Complaint  Patient presents with  . Follow-up    1 month follow up   anoro was $45 dollars per month.  pt is c/o lots of fluid built up and he cannot lay down to sleep.    We started him on oxygen October 2021. Last office visit I gave him samples of Anoro and prednisone taper.  He underwent thoracentesis of RT effusion , cytology showed mesothelial cells and lymphocytosis consistent with CLL on flow cytometry.  Breathing improved after thoracentesis, however he had repeat chest x-ray done with his PCP which I reviewed 11/30 which shows reaccumulation of right effusion.  I reviewed oncology note -discussion was about chemotherapy and he absolutely does not want to go through that again. He was prescribed oxycodone 5 mg twice daily and prednisone 40 mg by his PCP and he has been taking this with some relief.  Arrives in a wheelchair today accompanied by his wife and daughter who is an EMT.  Continues to complain of pain right lower line area and also in his right upper shoulder blade  Significant tests/ events reviewed  CT chest WO contrast 09/2020 >>Enlarging right lower lobe mass with multiple new right lung nodules.  Mediastinal, hilar and upper abdominal adenopathy is grossly stable from 03/07/2020 in this patient with a history of CLL. New small bilateral pleural effusions, partially loculated and slightly larger on the right    Spirometry1/2018  -no evidence of airway obstruction with FEV1 of 85% and FVC of 81%   CT imaging10/2017showed underlying centrilobular emphysema and right upper lobe airspace disease which was new when compared to his CT from 06/2016 .  CT chest 12/2018Stable to minimal progression of mild mediastinal and hilar lymphadenopathy Stable to slight decrease in size of the soft tissue lesion adjacent to the distal duodenum.  Review of Systems neg for any significant sore throat, dysphagia, itching, sneezing, nasal congestion or excess/ purulent secretions, fever, chills, sweats, unintended wt loss, pleuritic or exertional cp, hempoptysis, orthopnea pnd or change in chronic leg swelling. Also denies presyncope, palpitations, heartburn, abdominal pain, nausea, vomiting, diarrhea or change in bowel or urinary habits, dysuria,hematuria, rash, arthralgias, visual complaints, headache, numbness weakness or ataxia.     Objective:   Physical Exam  Gen. Pleasant, well-nourished, in no distress ENT - no thrush, no pallor/icterus,no post nasal drip Neck: No JVD, no thyromegaly, no carotid bruits Lungs: no use of accessory muscles, no dullness to percussion, decreased right base without rales or rhonchi  Cardiovascular: Rhythm regular, heart sounds  normal, no murmurs or gallops, no peripheral edema Musculoskeletal: No deformities, no cyanosis or clubbing , point tenderness over right ribs axillary line       Assessment & Plan:

## 2020-11-07 NOTE — Assessment & Plan Note (Signed)
He does not want any further chemotherapy.  His main issue is pain especially over his right ribs and right shoulder blade.  Right lower chest pain may be related to pleural extension of tumor.  We will have to discuss with radiation oncology if he is a candidate for radiation .  The shoulder pain is difficult to explain and may be referred pain Regardless Advil and oxycodone prescribed by his PCP would be the way forward right now.  He should also be referred to hospice care

## 2020-11-07 NOTE — Assessment & Plan Note (Addendum)
I independently reviewed chest x-ray today which shows reaccumulation of right effusion and increased in size This has reaccumulated within a few weeks.  He had significant relief from thoracentesis Pleural fluid analysis only shows cells of CLL and not lung cancer however given that his main focuses on quality of life going forward I think we can proceed with Pleurx catheter placement.  We will confirm with insurance that he has benefits and plan to do this Friday

## 2020-11-08 ENCOUNTER — Telehealth: Payer: Self-pay

## 2020-11-08 ENCOUNTER — Other Ambulatory Visit (HOSPITAL_COMMUNITY)
Admission: RE | Admit: 2020-11-08 | Discharge: 2020-11-08 | Disposition: A | Discharge status: Home / Self Care | Payer: PPO | Source: Ambulatory Visit | Attending: Pulmonary Disease | Admitting: Pulmonary Disease

## 2020-11-08 DIAGNOSIS — Z20822 Contact with and (suspected) exposure to covid-19: Secondary | ICD-10-CM | POA: Insufficient documentation

## 2020-11-08 DIAGNOSIS — I10 Essential (primary) hypertension: Secondary | ICD-10-CM | POA: Diagnosis not present

## 2020-11-08 DIAGNOSIS — C3491 Malignant neoplasm of unspecified part of right bronchus or lung: Secondary | ICD-10-CM | POA: Diagnosis not present

## 2020-11-08 DIAGNOSIS — Z01812 Encounter for preprocedural laboratory examination: Secondary | ICD-10-CM | POA: Insufficient documentation

## 2020-11-08 DIAGNOSIS — R0789 Other chest pain: Secondary | ICD-10-CM | POA: Diagnosis not present

## 2020-11-08 DIAGNOSIS — Z299 Encounter for prophylactic measures, unspecified: Secondary | ICD-10-CM | POA: Diagnosis not present

## 2020-11-08 LAB — SARS CORONAVIRUS 2 (TAT 6-24 HRS): SARS Coronavirus 2: NEGATIVE

## 2020-11-08 NOTE — Telephone Encounter (Signed)
Called and spoke with patient. Let them know their Pleurx Procedure is scheduled for 11/10/20 with Dr. Elsworth Soho at 2pm.  Patient was instructed to arrive at [place] at 1pm. They were instructed to bring someone with them as they will not be able to drive home from procedure. Patient instructed not to have anything to eat or drink after midnight.   Patient's covid screening is scheduled at [facility] for 11/08/20 at 2:45pm.  Patient voiced understanding, nothing further needed  Routing to Dr. Elsworth Soho as Juluis Rainier

## 2020-11-09 ENCOUNTER — Telehealth: Payer: Self-pay | Admitting: Pulmonary Disease

## 2020-11-09 NOTE — Telephone Encounter (Signed)
Spoke with the pt's spouse  I advised per other note that he is not to have anything to eat or drink after midnight tonight She has questions about him being able to take his meds before  I advised Endo will call today with instructions on this  Nothing further needed

## 2020-11-10 ENCOUNTER — Ambulatory Visit (HOSPITAL_COMMUNITY): Payer: PPO

## 2020-11-10 ENCOUNTER — Encounter (HOSPITAL_COMMUNITY): Payer: Self-pay | Admitting: Pulmonary Disease

## 2020-11-10 ENCOUNTER — Encounter (HOSPITAL_COMMUNITY)
Admission: RE | Disposition: A | Discharge status: Home / Self Care | Payer: Self-pay | Source: Home / Self Care | Attending: Pulmonary Disease

## 2020-11-10 ENCOUNTER — Telehealth: Payer: Self-pay | Admitting: Pulmonary Disease

## 2020-11-10 ENCOUNTER — Ambulatory Visit (HOSPITAL_COMMUNITY)
Admission: RE | Admit: 2020-11-10 | Discharge: 2020-11-10 | Disposition: A | Discharge status: Home / Self Care | Payer: PPO | Attending: Pulmonary Disease | Admitting: Pulmonary Disease

## 2020-11-10 DIAGNOSIS — J910 Malignant pleural effusion: Secondary | ICD-10-CM | POA: Diagnosis not present

## 2020-11-10 DIAGNOSIS — Z87891 Personal history of nicotine dependence: Secondary | ICD-10-CM | POA: Insufficient documentation

## 2020-11-10 DIAGNOSIS — Z7952 Long term (current) use of systemic steroids: Secondary | ICD-10-CM | POA: Insufficient documentation

## 2020-11-10 DIAGNOSIS — Z856 Personal history of leukemia: Secondary | ICD-10-CM | POA: Insufficient documentation

## 2020-11-10 DIAGNOSIS — J9611 Chronic respiratory failure with hypoxia: Secondary | ICD-10-CM | POA: Insufficient documentation

## 2020-11-10 DIAGNOSIS — J90 Pleural effusion, not elsewhere classified: Secondary | ICD-10-CM | POA: Diagnosis not present

## 2020-11-10 DIAGNOSIS — J432 Centrilobular emphysema: Secondary | ICD-10-CM | POA: Diagnosis not present

## 2020-11-10 DIAGNOSIS — C3431 Malignant neoplasm of lower lobe, right bronchus or lung: Secondary | ICD-10-CM | POA: Insufficient documentation

## 2020-11-10 DIAGNOSIS — Z9689 Presence of other specified functional implants: Secondary | ICD-10-CM

## 2020-11-10 DIAGNOSIS — Z9981 Dependence on supplemental oxygen: Secondary | ICD-10-CM | POA: Insufficient documentation

## 2020-11-10 HISTORY — PX: CHEST TUBE INSERTION: SHX231

## 2020-11-10 SURGERY — INSERTION, PLEURAL DRAINAGE CATHETER
Anesthesia: Moderate Sedation

## 2020-11-10 NOTE — Discharge Instructions (Signed)
Indwelling Pleural Catheter Home Guide  An indwelling pleural catheter is a thin, flexible tube that is inserted under your skin and into your chest. The catheter drains excess fluid that collects in the area between the chest wall and the lungs (pleural space).After the catheter is inserted, it can be attached to a bottle that collects fluid. The pleural catheter will allow you to drain fluid from your chest at home on a regular basis (sometimes daily). This will eliminate the need for frequent visits to the hospital or clinic to drain the fluid. The catheter may be removed after the excess fluid problem is resolved, usually after 2-3 months. It is important to follow instructions from your health care provider about how to drain and care for your catheter. What are the risks? Generally, this is a safe procedure. However, problems may occur, including:  Infection.  Skin damage around the catheter.  Lung damage.  Failure of the chest tube to work properly.  Spreading of cancer cells along the catheter, if you have cancer. Supplies needed:  Vacuum-sealed drainage bottle with attached drainage line.  Sterile dressing.  Sterile alcohol pads.  Sterile gloves.  Valve cap.  Sterile gauze pads, 4  4 inch (10 cm  10 cm).  Tape.  Adhesive dressing.  Sterile foam catheter pad. How to care for your catheter and insertion site  Wash your hands with soap and warm water before and after touching the catheter or insertion site. If soap and water are not available, use hand sanitizer.  Check your bandage (dressing) daily to make sure it is clean and dry.  Keep the skin around the catheter clean and dry.  Check the catheter regularly for any cracks or kinks in the tubing.  Check your catheter insertion site every day for signs of infection. Check for: ? Skin breakdown. ? Redness, swelling, or pain. ? Fluid or blood. ? Warmth. ? Pus or a bad smell. How to drain your catheter You  may need to drain your catheter every day, or more or less often as told by your health care provider. Follow instructions from your health care provider about how to drain your catheter. You may also refer to instructions that come with the drainage system. To drain the catheter: 1. Wash your hands with soap and warm water. If soap and water are not available, use hand sanitizer. 2. Carefully remove the dressing from around the catheter. 3. Wash your hands again. 4. Put on the gloves provided. 5. Prepare the vacuum-sealed drainage bottle and drainage line. Close the drainage line of the vacuum-sealed drainage bottle by squeezing the pinch clamp or rolling the wheel of the roller clamp toward the bottle. The vacuum in the bottle will be lost if the line is not closed completely. 6. Remove the access tip cover from the drainage line. Do not touch the end. Set it on a sterile surface. 7. Remove the catheter valve cap and throw it away. 8. Use an alcohol pad to clean the end of the catheter. 9. Insert the access tip into the catheter valve. Make sure the valve and access tip are securely connected. Listen for a click to confirm that they are connected. 10. Insert the T plunger to break the vacuum seal on the drainage bottle. 11. Open the clamp on the drainage line. 12. Allow the catheter to drain. Keep the catheter and the drainage bottle below the level of your chest. There may be a one-way valve on the end of the  tubing that will allow liquid and air to flow out of the catheter without letting air inside. 13. Drain the amount of fluid as told by your health care provider. It usually takes 5-15 minutes. Do not drain more than 1000 mL of fluid. You may feel a little discomfort while you are draining. If the pain is severe, stop draining and contact your health care provider. 14. After you finish draining the catheter, remove the drainage bottle tubing from the catheter. 15. Use a clean alcohol pad to  wipe the catheter tip. 16. Place a clean cap on the end of the catheter. 17. Use an alcohol pad to clean the skin around the catheter. 18. Allow the skin to air-dry. 19. Put the catheter pad on your skin. Curl the catheter into loops and place it on the pad. Do not place the catheter on your skin. 20. Replace the dressing over the catheter. 21. Discard the drainage bottle as instructed by your health care provider. Do not reuse the drainage bottle. How to change your dressing Change your dressing at least once a week, or more often if needed to keep the dressing dry. Be sure to change the dressing whenever it becomes moist. Your health care provider will tell you how often to change your dressing. 1. Wash your hands with soap and warm water. If soap and water are not available, use hand sanitizer. 2. Gently remove the old dressing. Avoid using scissors to remove the dressing. Sharp objects may damage the catheter. 3. Wash the skin around the insertion site with mild, fragrance-free soap and warm water. Rinse well, then pat the area dry with a clean cloth. 4. Check the skin around the catheter for signs of infection. Check for: ? Skin breakdown. ? Redness, swelling, or pain. ? Fluid or blood. ? Warmth. ? Pus or a bad smell. 5. If your catheter was stitched (sutured) to your skin, look at the suture to make sure it is still anchored in your skin. 6. Do not apply creams, ointments, or alcohol to the area. Let your skin air-dry completely before you apply a new dressing. 7. Curl the catheter into loops and place it on the sterile catheter pad. Do not place the catheter on your skin. 8. If you do not have a pad, use a clean dressing. Slide the dressing under the disk that holds the drainage catheter in place. 9. Use gauze to cover the catheter and the catheter pad. The catheter should rest on the pad or dressing, not on your skin. 10. Tape the dressing to your skin. You may be instructed to use an  adhesive dressing covering instead of gauze and tape. 11. Wash your hands with soap and warm water. If soap and water are not available, use hand sanitizer. General recommendations  Always wash your hands with soap and warm water before and after caring for your catheter and drainage bottle. Use a mild, fragrance-free soap. If soap and water are not available, use hand sanitizer.  Always make sure there are no leaks in the catheter or drainage bottle.  Each time you drain the catheter, note the color and amount of fluid.  Do not touch the tip of the catheter or the drainage bottle tubing.  Do not reuse drainage bottles.  Do not take baths, swim, or use a hot tub until your health care provider approves. Ask your health care provider if you may take showers. You may only be allowed to take sponge baths.  Take  deep breaths regularly, followed by a cough. Doing this can help to prevent lung infection. Contact a health care provider if:  You have any questions about caring for your catheter or drainage bottle.  You still have pain at the catheter insertion site more than 2 days after your procedure.  You have pain while draining your catheter.  Your catheter becomes bent, twisted, or cracked.  The connection between the catheter and the collection bottle becomes loose.  You have any of these around your catheter insertion site or coming from it: ? Skin breakdown. ? Redness, swelling, or pain. ? Fluid or blood. ? Warmth. ? Pus or a bad smell. Get help right away if:  You have a fever or chills.  You have chest pain.  You have dizziness or shortness of breath.  You have severe redness, swelling, or pain at your catheter insertion site.  The catheter comes out.  The catheter is blocked or clogged. Summary  An indwelling pleural catheter is a thin, flexible tube that is inserted under your skin and into your chest. The catheter drains excess fluid that collects in the area  between the chest wall and the lungs (pleural space).  It is important to follow instructions from your health care provider about how to drain and care for your catheter.  Do not touch the tip of the catheter or the drainage bottle tubing.  Always wash your hands with soap and water before and after caring for your catheter and drainage bottle. If soap and water are not available, use hand sanitizer. This information is not intended to replace advice given to you by your health care provider. Make sure you discuss any questions you have with your health care provider. Document Revised: 03/12/2019 Document Reviewed: 03/13/2017 Elsevier Patient Education  [place].   Use advil 400 mg twice daily as needed for pain Keep site dry Please bring the kit/ bottle with you for the next appointment We will try to get you pleurX supplies from [facility]

## 2020-11-10 NOTE — Discharge Planning (Signed)
RNCM faxed 46 Written Order and pt face sheet to [facility] [phone]) for pleural and peritoneal drainage supplies.  Packet given to Transitions of Care CMAs to mail documents for backup.

## 2020-11-10 NOTE — Discharge Planning (Signed)
RNCM consulted regarding pt needing home health for plure-x drainage catheter kit.  RNCM unsuccessful in securing home health services (all have declined to accept pt).  Advised to give pt early follow-up appointment to do teaching in the office.

## 2020-11-10 NOTE — Interval H&P Note (Signed)
History and Physical Interval Note:  11/10/2020 3:45 PM  Richard Evans  has presented today for surgery, with the diagnosis of pleural effusion.  The various methods of treatment have been discussed with the patient and family. After consideration of risks, benefits and other options for treatment, the patient has consented to  Procedure(s) with comments: INSERTION PLEURAL DRAINAGE CATHETER (N/A) - incision at 1430 as a surgical intervention.  The patient's history has been reviewed, patient examined, no change in status, stable for surgery.  I have reviewed the patient's chart and labs.  Questions were answered to the patient's satisfaction.     [name] Elsworth Soho

## 2020-11-10 NOTE — Telephone Encounter (Signed)
Please schedule OV with APP for pleurx catheter drainage & teaching in 1 week

## 2020-11-10 NOTE — Procedures (Addendum)
PleurX Insertion Procedure Note  JAIMESON GOPAL  [id number]  11-01-1946  Date:11/10/20  Time:3:05 PM   Provider Performing:Rakesh V. Alva  Procedure: PleurX Tunneled Pleural Catheter Placement (65790)  Indication(s) Relief of dyspnea from recurrent effusion  Consent Risks of the procedure as well as the alternatives and risks of each were explained to the patient and/or caregiver.  Consent for the procedure was obtained.   Anesthesia Topical only with 1% lidocaine    Time Out Verified patient identification, verified procedure, site/side was marked, verified correct patient position, special equipment/implants available, medications/allergies/relevant history reviewed, required imaging and test results available.   Sterile Technique Maximal sterile technique including sterile barrier drape, hand hygiene, sterile gown, sterile gloves, mask, hair covering.    Procedure Description Ultrasound used to identify appropriate pleural anatomy for placement and overlying skin marked.  Area of drainage cleaned and draped in sterile fashion.   Lidocaine was used to anesthetize the skin and subcutaneous tissue.   1.5 cm incision made overlying fluid and another about 5 cm anterior to this along chest wall.  PleurX catheter inserted in usual sterile fashion using modified seldinger technique.  Interrupted silk sutures placed at catheter insertion and tunneling points which will be removed at later date.  PleurX catheter then hooked to suction.  After fluid aspirated, pleurX capped and sterile dressing applied.   Complications/Tolerance None; patient tolerated the procedure well. Chest X-ray is ordered to confirm no post-procedural complication.   EBL Minimal   Specimen(s) none   Supervised by Dr Bonnita Nasuti. Elsworth Soho MD   I was present throughout the procedure and supervised/assisted during key parts of the exam.  Rodman Pickle, M.D. [facility] Pulmonary/Critical Care  Medicine

## 2020-11-10 NOTE — Progress Notes (Signed)
Camellia Wood from [facility] advised that patient does not qualify for home health. Dr. Elsworth Soho and Dr. Loanne Drilling aware that patient will need follow up appointment in office with 1 week, preferably by this Monday per Camellia Wood. Dr. Loanne Drilling advised nursing staff to send two PleurX drainage kits home with patient.

## 2020-11-12 ENCOUNTER — Encounter (HOSPITAL_COMMUNITY): Payer: Self-pay | Admitting: Pulmonary Disease

## 2020-11-13 DIAGNOSIS — J9611 Chronic respiratory failure with hypoxia: Secondary | ICD-10-CM | POA: Diagnosis not present

## 2020-11-13 DIAGNOSIS — R918 Other nonspecific abnormal finding of lung field: Secondary | ICD-10-CM | POA: Diagnosis not present

## 2020-11-16 NOTE — Telephone Encounter (Signed)
Has appointment with Volanda Napoleon NP on 12/20 .

## 2020-11-17 DIAGNOSIS — D649 Anemia, unspecified: Secondary | ICD-10-CM | POA: Diagnosis not present

## 2020-11-20 ENCOUNTER — Ambulatory Visit: Payer: PPO | Admitting: Primary Care

## 2020-11-20 ENCOUNTER — Encounter: Payer: Self-pay | Admitting: Primary Care

## 2020-11-20 ENCOUNTER — Other Ambulatory Visit: Payer: Self-pay

## 2020-11-20 ENCOUNTER — Telehealth: Payer: Self-pay | Admitting: Primary Care

## 2020-11-20 DIAGNOSIS — J90 Pleural effusion, not elsewhere classified: Secondary | ICD-10-CM | POA: Diagnosis not present

## 2020-11-20 NOTE — Patient Instructions (Addendum)
Drain pleurx every 2-3 days, do not drain more than 1,024ml. You can dispose of fluid in toilet.  If you have pain please stop drainage. If you notice bright red blood notify office  Follow-up: 10 days with [facility] NP    Indwelling Pleural Catheter Home Guide  An indwelling pleural catheter is a thin, flexible tube that is inserted under your skin and into your chest. The catheter drains excess fluid that collects in the area between the chest wall and the lungs (pleural space).After the catheter is inserted, it can be attached to a bottle that collects fluid. The pleural catheter will allow you to drain fluid from your chest at home on a regular basis (sometimes daily). This will eliminate the need for frequent visits to the hospital or clinic to drain the fluid. The catheter may be removed after the excess fluid problem is resolved, usually after 2-3 months. It is important to follow instructions from your health care provider about how to drain and care for your catheter. What are the risks? Generally, this is a safe procedure. However, problems may occur, including:  Infection.  Skin damage around the catheter.  Lung damage.  Failure of the chest tube to work properly.  Spreading of cancer cells along the catheter, if you have cancer. Supplies needed:  Vacuum-sealed drainage bottle with attached drainage line.  Sterile dressing.  Sterile alcohol pads.  Sterile gloves.  Valve cap.  Sterile gauze pads, 4  4 inch (10 cm  10 cm).  Tape.  Adhesive dressing.  Sterile foam catheter pad. How to care for your catheter and insertion site  Wash your hands with soap and warm water before and after touching the catheter or insertion site. If soap and water are not available, use hand sanitizer.  Check your bandage (dressing) daily to make sure it is clean and dry.  Keep the skin around the catheter clean and dry.  Check the catheter regularly for any cracks or kinks in the  tubing.  Check your catheter insertion site every day for signs of infection. Check for: ? Skin breakdown. ? Redness, swelling, or pain. ? Fluid or blood. ? Warmth. ? Pus or a bad smell. How to drain your catheter You may need to drain your catheter every day, or more or less often as told by your health care provider. Follow instructions from your health care provider about how to drain your catheter. You may also refer to instructions that come with the drainage system. To drain the catheter: 1. Wash your hands with soap and warm water. If soap and water are not available, use hand sanitizer. 2. Carefully remove the dressing from around the catheter. 3. Wash your hands again. 4. Put on the gloves provided. 5. Prepare the vacuum-sealed drainage bottle and drainage line. Close the drainage line of the vacuum-sealed drainage bottle by squeezing the pinch clamp or rolling the wheel of the roller clamp toward the bottle. The vacuum in the bottle will be lost if the line is not closed completely. 6. Remove the access tip cover from the drainage line. Do not touch the end. Set it on a sterile surface. 7. Remove the catheter valve cap and throw it away. 8. Use an alcohol pad to clean the end of the catheter. 9. Insert the access tip into the catheter valve. Make sure the valve and access tip are securely connected. Listen for a click to confirm that they are connected. 10. Insert the T plunger to break the  vacuum seal on the drainage bottle. 11. Open the clamp on the drainage line. 12. Allow the catheter to drain. Keep the catheter and the drainage bottle below the level of your chest. There may be a one-way valve on the end of the tubing that will allow liquid and air to flow out of the catheter without letting air inside. 13. Drain the amount of fluid as told by your health care provider. It usually takes 5-15 minutes. Do not drain more than 1000 mL of fluid. You may feel a little discomfort while  you are draining. If the pain is severe, stop draining and contact your health care provider. 14. After you finish draining the catheter, remove the drainage bottle tubing from the catheter. 15. Use a clean alcohol pad to wipe the catheter tip. 16. Place a clean cap on the end of the catheter. 17. Use an alcohol pad to clean the skin around the catheter. 18. Allow the skin to air-dry. 19. Put the catheter pad on your skin. Curl the catheter into loops and place it on the pad. Do not place the catheter on your skin. 20. Replace the dressing over the catheter. 21. Discard the drainage bottle as instructed by your health care provider. Do not reuse the drainage bottle. How to change your dressing Change your dressing at least once a week, or more often if needed to keep the dressing dry. Be sure to change the dressing whenever it becomes moist. Your health care provider will tell you how often to change your dressing. 1. Wash your hands with soap and warm water. If soap and water are not available, use hand sanitizer. 2. Gently remove the old dressing. Avoid using scissors to remove the dressing. Sharp objects may damage the catheter. 3. Wash the skin around the insertion site with mild, fragrance-free soap and warm water. Rinse well, then pat the area dry with a clean cloth. 4. Check the skin around the catheter for signs of infection. Check for: ? Skin breakdown. ? Redness, swelling, or pain. ? Fluid or blood. ? Warmth. ? Pus or a bad smell. 5. If your catheter was stitched (sutured) to your skin, look at the suture to make sure it is still anchored in your skin. 6. Do not apply creams, ointments, or alcohol to the area. Let your skin air-dry completely before you apply a new dressing. 7. Curl the catheter into loops and place it on the sterile catheter pad. Do not place the catheter on your skin. 8. If you do not have a pad, use a clean dressing. Slide the dressing under the disk that holds  the drainage catheter in place. 9. Use gauze to cover the catheter and the catheter pad. The catheter should rest on the pad or dressing, not on your skin. 10. Tape the dressing to your skin. You may be instructed to use an adhesive dressing covering instead of gauze and tape. 11. Wash your hands with soap and warm water. If soap and water are not available, use hand sanitizer. General recommendations  Always wash your hands with soap and warm water before and after caring for your catheter and drainage bottle. Use a mild, fragrance-free soap. If soap and water are not available, use hand sanitizer.  Always make sure there are no leaks in the catheter or drainage bottle.  Each time you drain the catheter, note the color and amount of fluid.  Do not touch the tip of the catheter or the drainage bottle tubing.  Do not reuse drainage bottles.  Do not take baths, swim, or use a hot tub until your health care provider approves. Ask your health care provider if you may take showers. You may only be allowed to take sponge baths.  Take deep breaths regularly, followed by a cough. Doing this can help to prevent lung infection. Contact a health care provider if:  You have any questions about caring for your catheter or drainage bottle.  You still have pain at the catheter insertion site more than 2 days after your procedure.  You have pain while draining your catheter.  Your catheter becomes bent, twisted, or cracked.  The connection between the catheter and the collection bottle becomes loose.  You have any of these around your catheter insertion site or coming from it: ? Skin breakdown. ? Redness, swelling, or pain. ? Fluid or blood. ? Warmth. ? Pus or a bad smell. Get help right away if:  You have a fever or chills.  You have chest pain.  You have dizziness or shortness of breath.  You have severe redness, swelling, or pain at your catheter insertion site.  The catheter comes  out.  The catheter is blocked or clogged. Summary  An indwelling pleural catheter is a thin, flexible tube that is inserted under your skin and into your chest. The catheter drains excess fluid that collects in the area between the chest wall and the lungs (pleural space).  It is important to follow instructions from your health care provider about how to drain and care for your catheter.  Do not touch the tip of the catheter or the drainage bottle tubing.  Always wash your hands with soap and water before and after caring for your catheter and drainage bottle. If soap and water are not available, use hand sanitizer. This information is not intended to replace advice given to you by your health care provider. Make sure you discuss any questions you have with your health care provider. Document Revised: 03/12/2019 Document Reviewed: 03/13/2017 Elsevier Patient Education  [place].

## 2020-11-20 NOTE — Telephone Encounter (Signed)
This was not sent through procedure pool, therefore I am not sure where things were sent. I will try to work through this tomorrow. One canister will get them to next week is that correct?

## 2020-11-20 NOTE — Telephone Encounter (Signed)
Lauren, please advise if you know if there is a specific referral that needs to be placed to help pt get the necessary supplies needed.

## 2020-11-20 NOTE — Telephone Encounter (Signed)
Few things first,   Have they filled out the [facility] form for supplies and been denied?   How often are they draining?   And how many canisters do they have left?

## 2020-11-20 NOTE — Progress Notes (Signed)
@Patient  ID: Richard Evans, male    DOB: Jul 28, 1946, 74 y.o.   MRN: [id number]  Chief Complaint  Patient presents with  . Follow-up    Referring provider: [name], MD  HPI: 74yoex heavy smoker, quit in 2010 (50 pack year hx). Following with Dr. Elsworth Soho for COPD, chronic hypoxic respiratory failure & RLL lung cancer  PMH -06/2016 CLL - presented with lymphocytosis,lymphadenopathy andautoimmune hemolytic anemia  He was treated with high-dose steroids in 2017 and then Rituxan in 2018  He was seen in 2017 forhemoptysis and abnormal CTshowing centrilobular emphysema   Previous [facility] pulmonary encounter: 11/07/20- Dr. Elsworth Soho We started him on oxygen October 2021. Last office visit I gave him samples of Anoro and prednisone taper.  He underwent thoracentesis of RT effusion , cytology showed mesothelial cells and lymphocytosis consistent with CLL on flow cytometry.  Breathing improved after thoracentesis, however he had repeat chest x-ray done with his PCP which I reviewed 11/30 which shows reaccumulation of right effusion.  I reviewed oncology note -discussion was about chemotherapy and he absolutely does not want to go through that again. He was prescribed oxycodone 5 mg twice daily and prednisone 40 mg by his PCP and he has been taking this with some relief.  Arrives in a wheelchair today accompanied by his wife and daughter who is an EMT.  Continues to complain of pain right lower line area and also in his right upper shoulder blade   11/20/2020 - Interim hx  Patient presents today for pleurx tube drainage and education. Accompanied by his wife, daughter and son-in-law. He had a tunneled pleural catheter placed on 11/10/20 by Dr. Elsworth Soho d/t recurrent effusion. Post procedure CXR showed interval placement of right basilar drainage catheter with slight decreased size of right pleural effusion. Persistent airspace disease at the right middle lobe and right base with residual  mild-to-moderate pleural effusion. No pneumothorax.  Patient reports increased shortness of breath of the last 10 days with associated abdominal bloating. He experiences dyspnea with activities such as showering. Last night his heart rate was elevated >120. Pleurx catheter has not been drained since initial placement on 11/10/2020. Wife tells me insurance is not covering pleurx drainage supplies.  Denies fever, chills, sweats, cough,chest pain, N/V/D.    Significant tests/ events reviewed  CT chest WO contrast 09/2020 >>Enlarging right lower lobe mass with multiple new right lung nodules.  Mediastinal, hilar and upper abdominal adenopathy is grossly stable from 03/07/2020 in this patient with a history of CLL. New small bilateral pleural effusions, partially loculated and slightly larger on the right  Spirometry1/2018 -no evidence of airway obstruction with FEV1 of 85% and FVC of 81%     Allergies  Allergen Reactions  . Norco [Hydrocodone-Acetaminophen] Other (See Comments)    Pt becomes fidgety & it does not help with pain.  . Adhesive [Tape] Itching and Rash    Paper tape or wrapped bandage is preferred    Immunization History  Administered Date(s) Administered  . Pneumococcal Conjugate-13 12/30/2016    Past Medical History:  Diagnosis Date  . Hemolytic anemia ([place])   . History of epistaxis    [facility]  . Hypertension   . Pneumonia   . Squamous cell lung cancer, right ([place])   . Vocal cord nodule    benign    Tobacco History: Social History   Tobacco Use  Smoking Status Former Smoker  . Packs/day: 2.00  . Years: 50.00  .  Pack years: 100.00  . Types: Cigarettes  . Quit date: 11/30/2009  . Years since quitting: 10.9  Smokeless Tobacco Never Used   Counseling given: Not Answered   Outpatient Medications Prior to Visit  Medication Sig Dispense Refill  . atenolol (TENORMIN) 25 MG tablet Take 25 mg by mouth at  bedtime.    . Calcium-Magnesium-Zinc (CAL-MAG-ZINC PO) Take 1 tablet by mouth daily.    . Cholecalciferol (VITAMIN D) 2000 units CAPS Take 2,000 Units by mouth daily.     . diphenhydrAMINE (BENADRYL) 25 mg capsule Take 25 mg by mouth at bedtime.     Marland Kitchen ELDERBERRY PO Take 1 capsule by mouth daily.     . folic acid (FOLVITE) 161 MCG tablet Take 800 mcg by mouth daily.    Marland Kitchen glipiZIDE (GLUCOTROL XL) 5 MG 24 hr tablet Take 5 mg by mouth daily as needed (blood sugar over 200).    Marland Kitchen ibuprofen (ADVIL) 200 MG tablet Take 400-600 mg by mouth every 6 (six) hours as needed for moderate pain.    Marland Kitchen levofloxacin (LEVAQUIN) 500 MG tablet Take 500 mg by mouth daily.    Marland Kitchen OVER THE COUNTER MEDICATION Take 1 tablet by mouth daily. [place] otc supplement    . Oxycodone HCl 10 MG TABS Take 10 mg by mouth 4 (four) times daily as needed (pain).    . predniSONE (DELTASONE) 10 MG tablet Take 30 mg by mouth daily with breakfast.    . tamsulosin (FLOMAX) 0.4 MG CAPS capsule Take 0.4 mg by mouth at bedtime.    Marland Kitchen umeclidinium-vilanterol (ANORO ELLIPTA) 62.5-25 MCG/INH AEPB Inhale 1 puff into the lungs daily. (Patient taking differently: Inhale 1 puff into the lungs daily as needed (shortness of breath).) 14 each 2   Facility-Administered Medications Prior to Visit  Medication Dose Route Frequency Provider Last Rate Last Admin  . acetaminophen-codeine (TYLENOL #3) 300-30 MG per tablet 1 tablet  1 tablet Oral Q8H PRN Rigoberto Noel, MD        Review of Systems  Review of Systems  Constitutional: Negative.   HENT: Negative.   Respiratory: Positive for shortness of breath. Negative for cough and wheezing.   Cardiovascular: Negative for chest pain.    Physical Exam  BP 138/74 (BP Location: Left Arm, Cuff Size: Normal)   Pulse 71   Temp 97.8 F (36.6 C) (Oral)   Ht 6' (1.829 m)   Wt 183 lb 3.2 oz (83.1 kg)   SpO2 96% Comment: 4L  BMI 24.85 kg/m  Physical Exam Constitutional:      Appearance: Normal appearance.   HENT:     Head: Normocephalic and atraumatic.  Cardiovascular:     Rate and Rhythm: Normal rate.     Comments: Regularly irregular, HR 66 Pulmonary:     Effort: Pulmonary effort is normal.     Comments: LS diminished right base, mild DOE  Skin:    Comments: Right pleurx cath drain intact with 2 sutures. No surrounding erythema, warmth or drainage to skin.   Neurological:     General: No focal deficit present.     Mental Status: He is alert and oriented to person, place, and time. Mental status is at baseline.  Psychiatric:        Mood and Affect: Mood normal.        Behavior: Behavior normal.        Thought Content: Thought content normal.        Judgment: Judgment normal.  Lab Results:  CBC    Component Value Date/Time   WBC 9.4 10/06/2020 1847   RBC 3.80 (L) 10/06/2020 1847   HGB 11.9 (L) 10/06/2020 1847   HGB 14.2 09/19/2020 0838   HGB 13.7 12/03/2017 0741   HCT 35.5 (L) 10/06/2020 1847   HCT 40.6 12/03/2017 0741   PLT 161 10/06/2020 1847   PLT 146 (L) 09/19/2020 0838   PLT 117 (L) 12/03/2017 0741   MCV 93.4 10/06/2020 1847   MCV 91.0 12/03/2017 0741   MCH 31.3 10/06/2020 1847   MCHC 33.5 10/06/2020 1847   RDW 23.0 (H) 10/06/2020 1847   RDW 15.4 (H) 12/03/2017 0741   LYMPHSABS 2.9 10/06/2020 1847   LYMPHSABS 3.9 (H) 12/03/2017 0741   MONOABS 1.0 10/06/2020 1847   MONOABS 0.8 12/03/2017 0741   EOSABS 0.0 10/06/2020 1847   EOSABS 0.1 12/03/2017 0741   BASOSABS 0.1 10/06/2020 1847   BASOSABS 0.1 12/03/2017 0741    BMET    Component Value Date/Time   NA 137 10/06/2020 1847   NA 141 12/03/2017 0741   K 4.1 10/06/2020 1847   K 4.1 12/03/2017 0741   CL 105 10/06/2020 1847   CO2 23 10/06/2020 1847   CO2 22 12/03/2017 0741   GLUCOSE 248 (H) 10/06/2020 1847   GLUCOSE 182 (H) 12/03/2017 0741   BUN 26 (H) 10/06/2020 1847   BUN 22.1 12/03/2017 0741   CREATININE 1.00 10/06/2020 1847   CREATININE 1.15 03/07/2020 1124   CREATININE 1.1 12/03/2017 0741    CALCIUM 9.1 10/06/2020 1847   CALCIUM 9.3 12/03/2017 0741   GFRNONAA >60 10/06/2020 1847   GFRNONAA >60 03/07/2020 1124   GFRAA >60 03/07/2020 1124    BNP    Component Value Date/Time   BNP 163.2 (H) 06/30/2016 1200    ProBNP No results found for: PROBNP  Imaging: DG Chest 2 View  Result Date: 11/07/2020 CLINICAL DATA:  History of right squamous cell lung cancer. EXAM: CHEST - 2 VIEW COMPARISON:  October 31, 2020 FINDINGS: Mild, diffuse, chronic appearing increased interstitial lung markings are again seen. There is persistent mild to moderate severity right basilar airspace disease. A stable moderate sized right-sided pleural effusion is noted. No pneumothorax is identified. The cardiac silhouette is mildly enlarged and unchanged in size. The visualized skeletal structures are unremarkable. IMPRESSION: Persistent mild to moderate severity right basilar airspace disease with a stable moderate sized right pleural effusion. Electronically Signed   By: Virgina Norfolk M.D.   On: 11/07/2020 22:58   DG CHEST PORT 1 VIEW  Result Date: 11/10/2020 CLINICAL DATA:  PleurX catheter EXAM: PORTABLE CHEST 1 VIEW COMPARISON:  11/07/2020 FINDINGS: Interval placement of right basilar drainage catheter, the tip is barely visible and projects over the mid lung base. Slight decreased size of right pleural effusion with mild to moderate residual. Persistent airspace disease at the right middle lobe and right base. No pneumothorax. Stable cardiomediastinal silhouette with aortic atherosclerosis. IMPRESSION: 1. Interval placement of right basilar drainage catheter with slight decreased size of right pleural effusion. Persistent airspace disease at the right middle lobe and right base with residual mild-to-moderate pleural effusion. 2. No pneumothorax. Electronically Signed   By: Donavan Foil M.D.   On: 11/10/2020 15:48     Assessment & Plan:   Pleural effusion on right Hx recurrent right pleural  effusion, pleural fluid analysis showed cell of CLL and not lung cancer. Quality of life is main focus. Pleurx catheter was placed on 11/10/20 by Dr. Elsworth Soho  for symptoms management.   Patient presents today with family for pleurx drainage and education. Dressing was removed, pleurx cath tubing was intact and skin around insertion site was free from signs of infection. Performed drainage per protocol and maintained sterile environment. We removed 1025ml of straw colored fluid. Patient tolerated procedure well without discomfort or pain. He reports some immediate improvement in dyspnea and was able to take deeper breaths. Removed two sutures from right chest wall where pleurx was inserted.Placed sterile clean/dry dressing over catheter site. Provided family with instructions and reviewed in detail how to perform procedure.   We will follow-up on getting supplies for patient. He was sent home with one additional bottle. Recommend draining pleurx catheter every 2-3 days. Reinforced not to drain more than 1L at one time and stop if he develop pain during procedure. Follow-up in 10 days. He has an apt with Dr. Elsworth Soho in February.      Martyn Ehrich, NP 11/20/2020

## 2020-11-20 NOTE — Telephone Encounter (Signed)
Yes, I told him to drain again next Monday 11/27/20 unless his symptoms get worse before than and he has an apt with me on Jan 3rd.

## 2020-11-20 NOTE — Telephone Encounter (Signed)
Patient had Pleurx catheter placed on 11/10/20.  Can we please follow-up on Pleurx catheter drainage supplies.  Wife tells me that their insurance health [employer] advantage was not covering these, can you please help me look into this and coordinate patient to receive supplies.  Need 15 drainage kits a month.

## 2020-11-20 NOTE — Assessment & Plan Note (Signed)
Hx recurrent right pleural effusion, pleural fluid analysis showed cell of CLL and not lung cancer. Quality of life is main focus. Pleurx catheter was placed on 11/10/20 by Dr. Elsworth Soho for symptoms management.   Patient presents today with family for pleurx drainage and education. Dressing was removed, pleurx cath tubing was intact and skin around insertion site was free from signs of infection. Performed drainage per protocol and maintained sterile environment. We removed 1066ml of straw colored fluid. Patient tolerated procedure well without discomfort or pain. He reports some immediate improvement in dyspnea and was able to take deeper breaths. Removed two sutures from right chest wall where pleurx was inserted.Placed sterile clean/dry dressing over catheter site. Provided family with instructions and reviewed in detail how to perform procedure.   We will follow-up on getting supplies for patient. He was sent home with one additional bottle. Recommend draining pleurx catheter every 2-3 days. Reinforced not to drain more than 1L at one time and stop if he develop pain during procedure. Follow-up in 10 days. He has an apt with Dr. Elsworth Soho in February.

## 2020-11-20 NOTE — Telephone Encounter (Signed)
So this was supposed to be set up by Dr. Elsworth Soho and coordinated by Dr. Loanne Drilling. He just told me to have him drain once a week. He said he faxed supplies requested to pleurx company. They have 1 canister on hand.

## 2020-11-21 ENCOUNTER — Telehealth: Payer: Self-pay | Admitting: Pulmonary Disease

## 2020-11-21 NOTE — Telephone Encounter (Signed)
Called spoke with patient's wife per patient. She states they do not have any canisters being delivered to them. She has the order form from Dr. Elsworth Soho and Denzil Hughes but nothing has been delivered. Patient's wife read off the entire order sheet to me and they are down to 3 canisters.   I called [facility] to follow up and they note they called the patient's wife and told her to call [facility] to assist where the patient could get supplies and [employer] does not contract with [employer].  I called [facility] at [phone] and left a voicemail for them to call back, I also sent an email that was listed on the voicemail for follow up

## 2020-11-21 NOTE — Telephone Encounter (Signed)
Lisa from [employer] called back. She states the patient can use [employer] for supplies. The patient has the original order form with Dr. Bari Mantis signature on it, so Lattie Haw is going to call the [employer] directly and let them know what to do moving forward to get their supplies.   Nothing further is needed at this time.  Will route to RA as FYI

## 2020-11-21 NOTE — Telephone Encounter (Signed)
Please follow-up with them next week to ensure that they have obtained supplies from [facility]

## 2020-11-22 ENCOUNTER — Telehealth: Payer: Self-pay | Admitting: Primary Care

## 2020-11-22 ENCOUNTER — Telehealth: Payer: Self-pay | Admitting: Pulmonary Disease

## 2020-11-22 NOTE — Telephone Encounter (Signed)
atc daughter, unable to reach , I NEED to talk to her if she calls back. (Azhane Eckart)  I have left her a message why I need her to call me back.     Will await her phone call back

## 2020-11-22 NOTE — Telephone Encounter (Signed)
Spoke with the pt's daughter and notified that the information that she requested be faxed to [place] was faxed to the number she provided. Nothing further needed.

## 2020-11-22 NOTE — Telephone Encounter (Signed)
Spoke with the pt's daughter, [name]  She states that [employer] approval for pleurex drain supplies is not going to be approved for another 14 days  Pt needs to be drained tomorrow and then after that he will only have 2 more bottles remaining   She is concerned that they will not have supplies arrive in time and he will run out  Lauren- is there someone we can call to help expedite this? Thanks!

## 2020-11-22 NOTE — Telephone Encounter (Signed)
disregard

## 2020-11-23 DIAGNOSIS — C3490 Malignant neoplasm of unspecified part of unspecified bronchus or lung: Secondary | ICD-10-CM | POA: Diagnosis not present

## 2020-11-23 DIAGNOSIS — J910 Malignant pleural effusion: Secondary | ICD-10-CM | POA: Diagnosis not present

## 2020-11-24 DIAGNOSIS — F419 Anxiety disorder, unspecified: Secondary | ICD-10-CM | POA: Diagnosis not present

## 2020-11-24 DIAGNOSIS — C3491 Malignant neoplasm of unspecified part of right bronchus or lung: Secondary | ICD-10-CM | POA: Diagnosis not present

## 2020-11-27 NOTE — Telephone Encounter (Signed)
LATE ENTRY:   Spoke with daughter she was able to come pick up supplies for an emergency canister.   Nothing further needed at this time.

## 2020-11-30 ENCOUNTER — Encounter: Payer: Self-pay | Admitting: Internal Medicine

## 2020-12-04 ENCOUNTER — Ambulatory Visit: Payer: PPO | Admitting: Primary Care

## 2020-12-04 DIAGNOSIS — I10 Essential (primary) hypertension: Secondary | ICD-10-CM | POA: Diagnosis not present

## 2020-12-04 DIAGNOSIS — J432 Centrilobular emphysema: Secondary | ICD-10-CM | POA: Diagnosis not present

## 2020-12-04 DIAGNOSIS — Z299 Encounter for prophylactic measures, unspecified: Secondary | ICD-10-CM | POA: Diagnosis not present

## 2020-12-04 DIAGNOSIS — Z87891 Personal history of nicotine dependence: Secondary | ICD-10-CM | POA: Diagnosis not present

## 2020-12-04 DIAGNOSIS — C3491 Malignant neoplasm of unspecified part of right bronchus or lung: Secondary | ICD-10-CM | POA: Diagnosis not present

## 2020-12-04 DIAGNOSIS — F419 Anxiety disorder, unspecified: Secondary | ICD-10-CM | POA: Diagnosis not present

## 2020-12-04 DIAGNOSIS — E1165 Type 2 diabetes mellitus with hyperglycemia: Secondary | ICD-10-CM | POA: Diagnosis not present

## 2020-12-05 NOTE — Telephone Encounter (Signed)
Called and spoke with [name] (daughter) per [facility] and went over Dr Bari Mantis recommendation. [name] stated she was at patient last appointment and plans to be at the next appointment the patient has with NP on 12/11/2020. Nothing further needed at this time.

## 2020-12-05 NOTE — Telephone Encounter (Signed)
Spoke with [name] (Daughter) per DPR, who confirmed that patient did receive the supplies from [place]. Confirmed upcoming scheduled office visit with NP for 12/11/2020 @10 :30am. Nothing further needed at this time.

## 2020-12-05 NOTE — Telephone Encounter (Signed)
Please ensure that daughter accompanies for appointments that we can teach her how to drain Pleurx catheter

## 2020-12-06 ENCOUNTER — Telehealth: Payer: Self-pay | Admitting: Pulmonary Disease

## 2020-12-06 NOTE — Telephone Encounter (Signed)
Spoke with daughter Bertram Gala (dpr on file), states that pt has worsening swelling in stomach/chest, equal bilateral swelling in ankles in tops of feet. Decreased ability to eat meals.    Last chest tube drainage: yesterday, drained 961ml.  Pt drains every 4th day, drains anywhere from 900-1068ml.  Pt tolerates draining well, denies any pain and feels better after drainage.  Pt having difficulty eating, does not notice any difference in being able to tolerate food around pleurX draining.    Pt saw PCP this week regarding this, was advised by PCP that he believes this is related to cancer spreading to liver.  Pt no longer sees oncologist- per daughter was told by oncology that "nothing can be done for him" and he doesn't believe that.  Per daughter, PCP stated that he could prescribe a few days of lasix to help remove fluid from LE but wanted them to check with [place] for further recommendations.  Weight at PCP was 186lb.  Pt was seen 12/20 by Beth and weight was 183lb.    Daughter is requesting recs.    Pharmacy: [facility].    RA is not on schedule.  Sending to [facility] since she saw pt on 12/20 to see if she has any additional recs for pt.  Please advise, thanks!

## 2020-12-06 NOTE — Telephone Encounter (Signed)
Called PCP's office and spoke with Dr. Woody Seller to make aware of Beth's recs. Called pt's daughter to make aware of recs.  Nothing further needed at this time- will close encounter.

## 2020-12-06 NOTE — Telephone Encounter (Signed)
That is up to the PCP, I would prob recommended 20mg  twice a day

## 2020-12-06 NOTE — Telephone Encounter (Signed)
I agree with PCPs assessment. Lasix is fine with me to use if PCP wants to prescribe that. See if Dr. Elsworth Soho has any openings in [place] sooner than mine or his apt

## 2020-12-06 NOTE — Telephone Encounter (Signed)
Spoke with pt's daughter, aware of recs.  She will reach out to PCP for lasix rx.   Pt is already scheduled to see Beth 1/10 and RA has no sooner availability, so we will keep appt the same time.  Nothing further needed at this time- will close encounter.

## 2020-12-06 NOTE — Telephone Encounter (Signed)
What dosage for lasix

## 2020-12-09 DIAGNOSIS — J9611 Chronic respiratory failure with hypoxia: Secondary | ICD-10-CM | POA: Diagnosis not present

## 2020-12-09 DIAGNOSIS — J432 Centrilobular emphysema: Secondary | ICD-10-CM | POA: Diagnosis not present

## 2020-12-09 DIAGNOSIS — C3491 Malignant neoplasm of unspecified part of right bronchus or lung: Secondary | ICD-10-CM | POA: Diagnosis not present

## 2020-12-09 DIAGNOSIS — C7951 Secondary malignant neoplasm of bone: Secondary | ICD-10-CM | POA: Diagnosis not present

## 2020-12-11 ENCOUNTER — Ambulatory Visit: Payer: PPO | Admitting: Adult Health

## 2020-12-11 ENCOUNTER — Ambulatory Visit: Payer: PPO | Admitting: Primary Care

## 2020-12-11 ENCOUNTER — Ambulatory Visit (INDEPENDENT_AMBULATORY_CARE_PROVIDER_SITE_OTHER): Payer: PPO | Admitting: Acute Care

## 2020-12-11 ENCOUNTER — Other Ambulatory Visit: Payer: Self-pay

## 2020-12-11 ENCOUNTER — Encounter: Payer: Self-pay | Admitting: Acute Care

## 2020-12-11 VITALS — BP 92/62 | HR 70 | Resp 16 | Ht 72.0 in | Wt 184.6 lb

## 2020-12-11 DIAGNOSIS — R040 Epistaxis: Secondary | ICD-10-CM

## 2020-12-11 DIAGNOSIS — E861 Hypovolemia: Secondary | ICD-10-CM

## 2020-12-11 DIAGNOSIS — R0602 Shortness of breath: Secondary | ICD-10-CM | POA: Diagnosis not present

## 2020-12-11 DIAGNOSIS — C3491 Malignant neoplasm of unspecified part of right bronchus or lung: Secondary | ICD-10-CM | POA: Diagnosis not present

## 2020-12-11 DIAGNOSIS — I9589 Other hypotension: Secondary | ICD-10-CM

## 2020-12-11 DIAGNOSIS — Z7189 Other specified counseling: Secondary | ICD-10-CM

## 2020-12-11 DIAGNOSIS — I499 Cardiac arrhythmia, unspecified: Secondary | ICD-10-CM

## 2020-12-11 NOTE — Progress Notes (Addendum)
History of Present Illness Richard Evans is a 75 y.o. male former heavy smoker ( Quit 2010 with a 100 pack year smoking history) with COPD, chronic hypoxic respiratory failure ( started on oxygen 09/2020)  & RLL lung cancer. No further chemo>> Pleurex cath placed 11/10/2020. He is followed by Dr. Elsworth Soho.  PMH -06/2016 CLL - presented withlymphocytosis,lymphadenopathy andautoimmune hemolytic anemia  He was treated with high-dose steroids in 2017 and then Rituxan in 2018  He was seen in 2017 forhemoptysis and abnormal CTshowing centrilobular emphysema  Stage IV squamous cell carcinoma of the lung documented in October 2021. He was found to have right lung mass with multifocal neoplasm lymphadenopathy as well as bone involvement. Molecular testing showed no PD-L1 expression.    12/11/2020 Follow Up for  Pleurx drain  in setting of terminal non small cell     ( squamous cell ) cancer Pt. Presents for follow up after Pleurex cath placement 12.09/2020, and initial drainage in the office 11/20/2020, with additional teaching for drainage per family at home. Pt.has a daughter and son in law who are EMT's.They have been helping with patient management as they have been concerned about patient's wife becoming overwhelmed and exhausted managing her husbands care as he becomes increasingly weaker.  Pt. Presents today on 4 L [place]. Sats on 4 L were 97%. Family have received supplies at home, and have been draining the patient's PleurX cath every other day. 1000 cc each time. The site looks clean , and without any signs of drainage or infection. The dressing is clean , dry and intact. They are doing an amazing job with his care.  The patient was drained this morning for 1000 cc's of pleural fluid  per the family as he has had worsening bloating and edema, shortness of breath. .   They state there is always more fluid to drain, but they stop at 1 L as instructed.  Per family, the patient has progressive  weakness in addition to increasing pleural drainage, and shorter intervals of comfort.  The color of the pleural  fluid has changed to a darker amber color. almost the same color as the patient's urine which also appears more concentrated. He is voiding , per his wife,  about the same amount as is his baseline. No decrease in output. She states he is drinking adequate fluid . Weight is stable. He is drinking a boost shake and eating about 1/2 a protein bar daily in addition to his meals . He has a poor appetite per family. He is in the wheelchair again today, and has progressive weakness. His wife continues to comment on the fact he was building a porch this past summer. He has had a rather rapid decline.  He  is taking lasix 20 mg every other day. This was initiated Friday 12/08/2020 to address the increase pleural fluid, and swelling and discomfort..  Pt states he  is getting uncomfortable after 24 hours without draining. Additionally per family he has developed an irregular heart rate when he is full, has  increased swelling and abdominal bloating, and with exertion. . They have noted this on his pulse ox readings  and per the patient he feels his heart rate jumping around. EKG today I the office showed NSR with Right bundle branch block and right axis -possible right ventricular hypertrophy .  The family are draining at least 1 L every 2 days. Now darker amber fluid than when they started. This is more frequent draining  than he has needed in the past.  We discussed that this was most likely progression of disease, but we can consider increasing lasix, as long as renal function allows, and BP allows. BP today was 92/62. Per family they have recordings of BP as low as 80 systolic. Marland KitchenHe will need lab work to evaluate renal function. We could consider midodrine to help with BP as needed if this becomes an issue.    Pt.  has very thick secretions. He was started on Mucinex by his PCP. This has helped to cough the  secretions up. .  Pt has also started having nose bleeds. I suspect this is 2/2 increased oxygen demand and flow. Family  increase his oxygen to as high a 5 L with exertion.  These stop with Tampon use and pressure.I have asked the family to start AYR nasal saline gel to see if this helps.   Pt. Continues to endorse pain in his left hip and right rib area. Focus of care is on  comfort.   I spoke with the patient's daughter after the patient and his wife left the room. I discussed that I think there is a need to involve Hospice in her father's care. His decline is evident to his daughter. She states her mother is resistant to this as she feels he will die sooner under Hospice care. He is a full DNR. Patient daughter  feels Hospice is the right move, and will try to discuss further with the patient and his wife. His wife can no longer handle care at home alone.   Test Results: 10/25 CT Guided Biopsy>> Non-small cell carcinoma, Squamous cell CT-guided core biopsy of the mass in the right lower lobe. CT-guided thoracentesis. 760 mL of fluid was removed. Fluid was sent for cytology.  09/11/2020 CT Chest WO Contrast Enlarging right lower lobe mass with multiple new right lung nodules. Findings may be due to primary bronchogenic carcinoma or pulmonary lymphoma. Mediastinal, hilar and upper abdominal adenopathy is grossly stable from 03/07/2020 in this patient with a history of CLL. aortic atherosclerosis (ICD10-I70.0). Coronary artery calcification. New small bilateral pleural effusions, partially loculated and slightly larger on the right. Enlarged pulmonic trunk, indicative of pulmonary arterial hypertension. Emphysema (ICD10-J43.9).   09/2020 PET Scan Intensely FDG avid mass in the RIGHT lung base with adjacent nodule also FDG avid, most suspicious for multifocal bronchogenic neoplasm based on the discrepant FDG uptake that is seen within enlarged lymph nodes in the chest. Referral to  multi disciplinary thoracic oncologic setting and consideration for biopsy is suggested. Findings suspicious for malignant effusion in the RIGHT chest as described. Many visible small pleural based nodules are not FDG avid but there is a focus of increased FDG uptake in the dependent aspect of the effusion near the costodiaphragmatic sulcus. Increased FDG uptake within the subcarinal lymph node as compared other lymph nodes. This is nonspecific, this could represent nodal involvement from primary pulmonary neoplasm or more profound nodal involvement with lymphoproliferative process. Signs of bony involvement most notably in the RIGHT posterior iliac with area of defined sclerosis and increased FDG uptake new from previous imaging. Mild splenic enlargement with FDG uptake slightly less than adjacent liver. Diverticulosis, LEFT inguinal hernia and signs of cholecystectomy.   CBC Latest Ref Rng & Units 10/06/2020 09/25/2020 09/19/2020  WBC 4.0 - 10.5 K/uL 9.4 10.4 11.2(H)  Hemoglobin 13.0 - 17.0 g/dL 11.9(L) 13.6 14.2  Hematocrit 39.0 - 52.0 % 35.5(L) 41.0 41.4  Platelets 150 - 400 K/uL  161 192 146(L)    BMP Latest Ref Rng & Units 10/06/2020 03/07/2020 12/14/2019  Glucose 70 - 99 mg/dL 248(H) 103(H) 185(H)  BUN 8 - 23 mg/dL 26(H) 23 20  Creatinine 0.61 - 1.24 mg/dL 1.00 1.15 1.17  Sodium 135 - 145 mmol/L 137 139 142  Potassium 3.5 - 5.1 mmol/L 4.1 4.6 4.2  Chloride 98 - 111 mmol/L 105 109 109  CO2 22 - 32 mmol/L _0 Calcium 8.9 - 10.3 mg/dL 9.1 9.2 8.6(L)    BNP    Component Value Date/Time   BNP 163.2 (H) 06/30/2016 1200    ProBNP No results found for: PROBNP  PFT No results found for: FEV1PRE, FEV1POST, FVCPRE, FVCPOST, TLC, DLCOUNC, PREFEV1FVCRT, PSTFEV1FVCRT  No results found.   Past medical hx Past Medical History:  Diagnosis Date  . Hemolytic anemia ([place])   . History of epistaxis    hx of epistaxis on ASA.[facility]  . Hypertension    . Pneumonia   . Squamous cell lung cancer, right ([place])   . Vocal cord nodule    benign     Social History   Tobacco Use  . Smoking status: Former Smoker    Packs/day: 2.00    Years: 50.00    Pack years: 100.00    Types: Cigarettes    Quit date: 11/30/2009    Years since quitting: 11.0  . Smokeless tobacco: Never Used  Vaping Use  . Vaping Use: Never used  Substance Use Topics  . Alcohol use: No  . Drug use: No    Mr.Bahe reports that he quit smoking about 11 years ago. His smoking use included cigarettes. He has a 100.00 pack-year smoking history. He has never used smokeless tobacco. He reports that he does not drink alcohol and does not use drugs.  Tobacco Cessation: Former smoker , quit 2011 with a 100 pack year smoking history  Past surgical hx, Family hx, Social hx all reviewed.  Current Outpatient Medications on File Prior to Visit  Medication Sig  . ALPRAZolam (XANAX) 0.5 MG tablet Take 0.5 mg by mouth 3 (three) times daily.  Marland Kitchen atenolol (TENORMIN) 25 MG tablet Take 25 mg by mouth at bedtime.  . Calcium-Magnesium-Zinc (CAL-MAG-ZINC PO) Take 1 tablet by mouth daily.  . Cholecalciferol (VITAMIN D) 2000 units CAPS Take 2,000 Units by mouth daily.   . diphenhydrAMINE (BENADRYL) 25 mg capsule Take 25 mg by mouth at bedtime.   Marland Kitchen ELDERBERRY PO Take 1 capsule by mouth daily.   . folic acid (FOLVITE) 220 MCG tablet Take 800 mcg by mouth daily.  . furosemide (LASIX) 20 MG tablet Take 20 mg by mouth daily as needed.  Marland Kitchen glipiZIDE (GLUCOTROL XL) 5 MG 24 hr tablet Take 5 mg by mouth daily as needed (blood sugar over 200).  Marland Kitchen ibuprofen (ADVIL) 200 MG tablet Take 400-600 mg by mouth every 6 (six) hours as needed for moderate pain.  Marland Kitchen levofloxacin (LEVAQUIN) 500 MG tablet Take 500 mg by mouth daily.  Marland Kitchen OVER THE COUNTER MEDICATION Take 1 tablet by mouth daily. [place] otc supplement  . Oxycodone HCl 10 MG TABS Take 10 mg by mouth 4 (four) times daily as needed (pain).  .  potassium chloride (KLOR-CON) 10 MEQ tablet Take 10 mEq by mouth every other day as needed.  . predniSONE (DELTASONE) 10 MG tablet Take 30 mg by mouth daily with breakfast.  . rosuvastatin (CRESTOR) 5 MG tablet Take 5 mg by mouth once a  week.  . tamsulosin (FLOMAX) 0.4 MG CAPS capsule Take 0.4 mg by mouth at bedtime.  Marland Kitchen umeclidinium-vilanterol (ANORO ELLIPTA) 62.5-25 MCG/INH AEPB Inhale 1 puff into the lungs daily. (Patient taking differently: Inhale 1 puff into the lungs daily as needed (shortness of breath).)   Current Facility-Administered Medications on File Prior to Visit  Medication  . acetaminophen-codeine (TYLENOL #3) 300-30 MG per tablet 1 tablet     Allergies  Allergen Reactions  . Norco [Hydrocodone-Acetaminophen] Other (See Comments)    Pt becomes fidgety & it does not help with pain.  . Adhesive [Tape] Itching and Rash    Paper tape or wrapped bandage is preferred    Review Of Systems:  Constitutional:   No  weight loss, night sweats,  Fevers, chills, ++ fatigue, or  lassitude.  HEENT:   No headaches,  Difficulty swallowing,  Tooth/dental problems, or  Sore throat,                No sneezing, itching, ear ache, nasal congestion, post nasal drip,   CV:  No chest pain,  Orthopnea, PND, + swelling in lower extremities, anasarca, dizziness,+  palpitations, syncope.   GI  No heartburn, indigestion, abdominal pain, nausea, vomiting, diarrhea, change in bowel habits, loss of appetite, bloody stools.   Resp: + shortness of breath with exertion or at rest.  + excess mucus, no productive cough,  No non-productive cough,  No coughing up of blood.  No change in color of mucus.  No wheezing.  No chest wall deformity  Skin: no rash or lesions.  GU: no dysuria, + change in color of urine, no urgency or frequency.  No flank pain, no hematuria   MS:  No joint pain or swelling.  No decreased range of motion.  No back pain. + L hip pain and right rib pain  Psych:  No change in mood or  affect. No depression or anxiety.  No memory loss.   Vital Signs BP 92/62   Pulse 70   Resp 16   Ht 6' (1.829 m)   Wt 184 lb 9.6 oz (83.7 kg)   SpO2 97%   BMI 25.04 kg/m    Physical Exam:  General- No distress,  A&Ox3, frail and deconditioned ENT: No sinus tenderness, TM clear, pale nasal mucosa, no oral exudate,no post nasal drip, no LAN Cardiac: S1, S2, regular rate and rhythm, no murmur, rub or gallop. Chest: No wheeze/ rales/ + r sided dullness; no accessory muscle use, no nasal flaring, no sternal retractions Abd.: Soft Non-tender, ND, BS +, Body mass index is 25.04 kg/m., PleurX cath dressing is clean dry and intact. No signs of infection Ext: No clubbing cyanosis, 1+ edema Neuro:  Deconditioned at baseline, MAE x 4, A&O x 3, pleasant but obviously uncomfortable Skin: No rashes, No lesions, warm and dry Psych: normal mood and behavior, anxious   Assessment/Plan End Stage Squamous Cell Lung Cancer PleurX cath drainage and management Plan Continue draining every other day as you have been doing, no more than 1000 cc's  Drain pleurx every 2-3 days, do not drain more than 1,06ml. You can dispose of fluid in toilet.  If you have pain please stop drainage. If you notice bright red blood, or any drainage or indication of infection  notify office immediately. Hospice Care would provide additional resources and assistance to the family, encouraged to allow [place] to make referral.   Low BP Plan Monitor BP at home. If systolic BP is consistently below  90, let the office know Be careful of dizziness with low BP as this can increase risk of fall. Consider addition of midodrine as appropriate   Increasing swelling/ drainage amounts Plan We are unable to draw labs tonight as our phlebotomist has gone home.  Please contact PCP and have them draw a BMET/ CBC, PT, INR  This is to check renal function, and blood count/ coags  and help to determine further treatment with diuretic/  reason for increased fatigue and nose bleeds  Nose Bleeds  Most likely 2/2 increased oxygen needs Plan We will check a CBC, INR Try AYR saline gel Use tampons as you have been doing for nose bleed Seek Emergency care for nose bleed that does not stop with treatment  Irregular Heart Rate when full/ exertional Concern for atrial fibrillation Plan EKG today>> NSR with RBBB ( same as previous EKG's) Qtc < 500 BMET to eval K, Na           Magdalen Spatz, NP 12/11/2020  10:00 PM

## 2020-12-11 NOTE — Patient Instructions (Addendum)
It is good to meet you today. Please have your PCP draw a BMET/ CBC, PT, INR  . Please have them fax the results to our office. Ardean Larsen, NP We need to assess renal function before we can consider increasing Lasix frequency or dose.  EKG today.>> NSR Continue Mucinex to help thin secretions.  Continue draining every other day as you have been doing, no more than 1000 cc's  Drain pleurx every 2-3 days, do not drain more than 1,066ml. You can dispose of fluid in toilet.  If you have pain please stop drainage. If you notice bright red blood, or any drainage or indication of infection  notify office immediately. Try  Ayr saline gel to see if this can help with the nose bleeds.  Use tampons as you have been doing for nose bleed Seek Emergency care for nose bleed that does not stop with treatment Follow up in 2 weeks with Video  Visit, unless there is a significant change in condition which would warrant an in person visit.  Follow up with Dr. Elsworth Soho in the [place] office 01/15/2021 as is scheduled Please contact office for sooner follow up if symptoms do not improve or worsen or seek emergency care  Let us know if you need [place] sooner.    Indwelling Pleural Catheter Home Guide   An indwelling pleural catheter is a thin, flexible tube that is inserted under your skin and into your chest. The catheter drains excess fluid that collects in the area between the chest wall and the lungs (pleural space).After the catheter is inserted, it can be attached to a bottle that collects fluid. The pleural catheter will allow you to drain fluid from your chest at home on a regular basis (sometimes daily). This will eliminate the need for frequent visits to the hospital or clinic to drain the fluid. The catheter may be removed after the excess fluid problem is resolved, usually after 2-3 months. It is important to follow instructions from your health care provider about how to drain and care for your  catheter. What are the risks? Generally, this is a safe procedure. However, problems may occur, including:  Infection.  Skin damage around the catheter.  Lung damage.  Failure of the chest tube to work properly.  Spreading of cancer cells along the catheter, if you have cancer. Supplies needed:  Vacuum-sealed drainage bottle with attached drainage line.  Sterile dressing.  Sterile alcohol pads.  Sterile gloves.  Valve cap.  Sterile gauze pads, 4  4 inch (10 cm  10 cm).  Tape.  Adhesive dressing.  Sterile foam catheter pad. How to care for your catheter and insertion site  Wash your hands with soap and warm water before and after touching the catheter or insertion site. If soap and water are not available, use hand sanitizer.  Check your bandage (dressing) daily to make sure it is clean and dry.  Keep the skin around the catheter clean and dry.  Check the catheter regularly for any cracks or kinks in the tubing.  Check your catheter insertion site every day for signs of infection. Check for: ? Skin breakdown. ? Redness, swelling, or pain. ? Fluid or blood. ? Warmth. ? Pus or a bad smell. How to drain your catheter You may need to drain your catheter every day, or more or less often as told by your health care provider. Follow instructions from your health care provider about how to drain your catheter. You may also refer  to instructions that come with the drainage system. To drain the catheter: 1. Wash your hands with soap and warm water. If soap and water are not available, use hand sanitizer. 2. Carefully remove the dressing from around the catheter. 3. Wash your hands again. 4. Put on the gloves provided. 5. Prepare the vacuum-sealed drainage bottle and drainage line. Close the drainage line of the vacuum-sealed drainage bottle by squeezing the pinch clamp or rolling the wheel of the roller clamp toward the bottle. The vacuum in the bottle will be lost if the  line is not closed completely. 6. Remove the access tip cover from the drainage line. Do not touch the end. Set it on a sterile surface. 7. Remove the catheter valve cap and throw it away. 8. Use an alcohol pad to clean the end of the catheter. 9. Insert the access tip into the catheter valve. Make sure the valve and access tip are securely connected. Listen for a click to confirm that they are connected. 10. Insert the T plunger to break the vacuum seal on the drainage bottle. 11. Open the clamp on the drainage line. 12. Allow the catheter to drain. Keep the catheter and the drainage bottle below the level of your chest. There may be a one-way valve on the end of the tubing that will allow liquid and air to flow out of the catheter without letting air inside. 13. Drain the amount of fluid as told by your health care provider. It usually takes 5-15 minutes. Do not drain more than 1000 mL of fluid. You may feel a little discomfort while you are draining. If the pain is severe, stop draining and contact your health care provider. 14. After you finish draining the catheter, remove the drainage bottle tubing from the catheter. 15. Use a clean alcohol pad to wipe the catheter tip. 16. Place a clean cap on the end of the catheter. 17. Use an alcohol pad to clean the skin around the catheter. 18. Allow the skin to air-dry. 19. Put the catheter pad on your skin. Curl the catheter into loops and place it on the pad. Do not place the catheter on your skin. 20. Replace the dressing over the catheter. 21. Discard the drainage bottle as instructed by your health care provider. Do not reuse the drainage bottle. How to change your dressing Change your dressing at least once a week, or more often if needed to keep the dressing dry. Be sure to change the dressing whenever it becomes moist. Your health care provider will tell you how often to change your dressing. 1. Wash your hands with soap and warm water. If  soap and water are not available, use hand sanitizer. 2. Gently remove the old dressing. Avoid using scissors to remove the dressing. Sharp objects may damage the catheter. 3. Wash the skin around the insertion site with mild, fragrance-free soap and warm water. Rinse well, then pat the area dry with a clean cloth. 4. Check the skin around the catheter for signs of infection. Check for: ? Skin breakdown. ? Redness, swelling, or pain. ? Fluid or blood. ? Warmth. ? Pus or a bad smell. 5. If your catheter was stitched (sutured) to your skin, look at the suture to make sure it is still anchored in your skin. 6. Do not apply creams, ointments, or alcohol to the area. Let your skin air-dry completely before you apply a new dressing. 7. Curl the catheter into loops and place it on the  sterile catheter pad. Do not place the catheter on your skin. 8. If you do not have a pad, use a clean dressing. Slide the dressing under the disk that holds the drainage catheter in place. 9. Use gauze to cover the catheter and the catheter pad. The catheter should rest on the pad or dressing, not on your skin. 10. Tape the dressing to your skin. You may be instructed to use an adhesive dressing covering instead of gauze and tape. 11. Wash your hands with soap and warm water. If soap and water are not available, use hand sanitizer. General recommendations  Always wash your hands with soap and warm water before and after caring for your catheter and drainage bottle. Use a mild, fragrance-free soap. If soap and water are not available, use hand sanitizer.  Always make sure there are no leaks in the catheter or drainage bottle.  Each time you drain the catheter, note the color and amount of fluid.  Do not touch the tip of the catheter or the drainage bottle tubing.  Do not reuse drainage bottles.  Do not take baths, swim, or use a hot tub until your health care provider approves. Ask your health care provider if you  may take showers. You may only be allowed to take sponge baths.  Take deep breaths regularly, followed by a cough. Doing this can help to prevent lung infection. Contact a health care provider if:  You have any questions about caring for your catheter or drainage bottle.  You still have pain at the catheter insertion site more than 2 days after your procedure.  You have pain while draining your catheter.  Your catheter becomes bent, twisted, or cracked.  The connection between the catheter and the collection bottle becomes loose.  You have any of these around your catheter insertion site or coming from it: ? Skin breakdown. ? Redness, swelling, or pain. ? Fluid or blood. ? Warmth. ? Pus or a bad smell. Get help right away if:  You have a fever or chills.  You have chest pain.  You have dizziness or shortness of breath.  You have severe redness, swelling, or pain at your catheter insertion site.  The catheter comes out.  The catheter is blocked or clogged. Summary  An indwelling pleural catheter is a thin, flexible tube that is inserted under your skin and into your chest. The catheter drains excess fluid that collects in the area between the chest wall and the lungs (pleural space).  It is important to follow instructions from your health care provider about how to drain and care for your catheter.  Do not touch the tip of the catheter or the drainage bottle tubing.  Always wash your hands with soap and water before and after caring for your catheter and drainage bottle. If soap and water are not available, use hand sanitizer. This information is not intended to replace advice given to you by your health care provider. Make sure you discuss any questions you have with your health care provider. Document Revised: 03/12/2019 Document Reviewed: 03/13/2017 Elsevier Patient Education  [place].

## 2020-12-13 ENCOUNTER — Telehealth: Payer: Self-pay | Admitting: Pulmonary Disease

## 2020-12-13 DIAGNOSIS — R5383 Other fatigue: Secondary | ICD-10-CM | POA: Diagnosis not present

## 2020-12-13 DIAGNOSIS — C3491 Malignant neoplasm of unspecified part of right bronchus or lung: Secondary | ICD-10-CM | POA: Diagnosis not present

## 2020-12-13 NOTE — Telephone Encounter (Signed)
She did the right thing by stopping drainage when he had pain. If pain persists, we can obtain chest x-ray

## 2020-12-13 NOTE — Telephone Encounter (Signed)
Spoke with Richard Evans and notified of response per Dr Elsworth Soho  She verbalized understanding  States that pt seems to be doing better but still has very minimal discomfort if takes a really deep breath  He is resting now  Will see how he does overnight and call us tomorrow with update  Will forward back to triage to f/u on 12/14/20

## 2020-12-13 NOTE — Telephone Encounter (Signed)
Called and spoke with the pt's daughter  She states that this morning when draining the pleurx catheter, pt had some pain at tube insertion site  She states they stopped at 900 ml  When she emptied the fluid she noticed a thick, slime like consistency pink in color  She states the pain he is having only occurs upon deep inspiration  Not having any other new problems  Please advise, thanks!

## 2020-12-14 DIAGNOSIS — J9611 Chronic respiratory failure with hypoxia: Secondary | ICD-10-CM | POA: Diagnosis not present

## 2020-12-14 DIAGNOSIS — R918 Other nonspecific abnormal finding of lung field: Secondary | ICD-10-CM | POA: Diagnosis not present

## 2020-12-14 NOTE — Progress Notes (Signed)
Results reviewed with patient and family at the time of the OV 12/11/2020

## 2020-12-14 NOTE — Telephone Encounter (Signed)
Spoke with pt's daughter and she reports that the pt is no longer having any pain. I advised to call if it comes back or other problems. Nothing further needed.

## 2020-12-14 NOTE — Telephone Encounter (Signed)
Lmtcb for [employer].

## 2020-12-19 NOTE — Telephone Encounter (Signed)
Please see if Dr. Elsworth Soho has any sooner apt available, [place] ok. Please also cc him as he placed pleurx drain and should be involved in management.

## 2020-12-19 NOTE — Telephone Encounter (Signed)
That's ok if it is draining amber. If it was frank red or he had pain would not drain. I would not drain more than 580ml. Needs to follow up with PCP regarding BP. If he is taking blood pressure medication would recommend holding them. Push oral fluids.

## 2020-12-19 NOTE — Telephone Encounter (Addendum)
Please advise on pt email:  [facility] Good Afternoon,  This is Reymundo Poll, Richard Evans's daughter. I have been trying to call the office all day with no answer. We have a question about the color of his plueral fluid, that we drained on Sunday. He had a good amount of blood color mixed with the dark Amber color. We was able to drain 750 ml. We just wanted to know if this is a semi normal thing, with the progression of the cancer? Also, his BP is still running really low. Yesterday it was running 84/48 and his HR would jump to 104 and then come back down; then jump right back up. I know that you had mentioned some medication, that could help to regulate his BP.  If you would like to give me a call, he does have me down to be able to talk to you guys about his medical condition and to help in the care. My number is [phone].  Thank you for you help.  [name]     Thank you so much.  We are needing to know if we can drain him, today because he is really bloated and feels that he needs to be. But we want to make sure that it's okay, with that different color.

## 2020-12-19 NOTE — Telephone Encounter (Signed)
I have called and spoke with pt's daughter, [name] and scheduled the pt for tomorrow in [place] at 9:30 am

## 2020-12-20 ENCOUNTER — Ambulatory Visit (INDEPENDENT_AMBULATORY_CARE_PROVIDER_SITE_OTHER): Payer: PPO | Admitting: Pulmonary Disease

## 2020-12-20 ENCOUNTER — Telehealth: Payer: Self-pay | Admitting: Pulmonary Disease

## 2020-12-20 ENCOUNTER — Ambulatory Visit (HOSPITAL_COMMUNITY)
Admission: RE | Admit: 2020-12-20 | Discharge: 2020-12-20 | Disposition: A | Discharge status: Home / Self Care | Payer: PPO | Source: Ambulatory Visit | Attending: Pulmonary Disease | Admitting: Pulmonary Disease

## 2020-12-20 ENCOUNTER — Encounter: Payer: Self-pay | Admitting: Pulmonary Disease

## 2020-12-20 ENCOUNTER — Other Ambulatory Visit: Payer: Self-pay

## 2020-12-20 DIAGNOSIS — C3491 Malignant neoplasm of unspecified part of right bronchus or lung: Secondary | ICD-10-CM

## 2020-12-20 DIAGNOSIS — J9611 Chronic respiratory failure with hypoxia: Secondary | ICD-10-CM

## 2020-12-20 DIAGNOSIS — J90 Pleural effusion, not elsewhere classified: Secondary | ICD-10-CM

## 2020-12-20 DIAGNOSIS — I10 Essential (primary) hypertension: Secondary | ICD-10-CM

## 2020-12-20 DIAGNOSIS — J811 Chronic pulmonary edema: Secondary | ICD-10-CM | POA: Diagnosis not present

## 2020-12-20 DIAGNOSIS — J9811 Atelectasis: Secondary | ICD-10-CM | POA: Diagnosis not present

## 2020-12-20 NOTE — Telephone Encounter (Signed)
Residual Rt effusion on CXR  Continue drainage twice/ week - Wednesday & sunday

## 2020-12-20 NOTE — Progress Notes (Signed)
   Subjective:    Patient ID: Richard Evans, male    DOB: 1946-04-24, 75 y.o.   MRN: [id number]  HPI  74yoex heavy smokerfor FU of COPD, chronic hypoxic respiratory failure on O2 since 09/2020 & RLL lung cancer Recurrent right effusion status post Pleurx catheter 11/10/20  PMH -06/2016 CLL - presented withlymphocytosis,lymphadenopathy andautoimmune hemolytic anemia He was treated with high-dose steroids in 2017 and then Rituxan in 2018  He declined chemotherapy. Due to recurrent right pleural effusion, we placed Pleurx catheter. Daughter [name] and son-in-law who are EMTs have been assisting with home drainage. 1/12 -they called regarding pain after drainage, this resolved within a day. 1/18 phone call, blood pressure in the 80s and red-colored tinge to pleural fluid, used to be [employer] 1/12 showed BUN/creatinine 52/1.5, normal electrolytes, no leukocytosis or anemia, mild thrombocytopenia  Blood pressure is 1 teens today, saturation was in the low 80s but conserving device was not triggering once triggering saturation improved to 93% on 4 L pulse  No cough. Complains of constipation and bloating. Continues to have pain in his shoulder, takes Advil and oxycodone for this  Significant tests/ events reviewed  CT chest WO contrast 09/2020 >>Enlarging right lower lobe mass with multiple new right lung nodules.  Mediastinal, hilar and upper abdominal adenopathy is grossly stable from 03/07/2020 in this patient with a history of CLL. New small bilateral pleural effusions, partially loculated and slightly larger on the right   Spirometry1/2018 -no evidence of airway obstruction with FEV1 of 85% and FVC of 81%   Past Medical History:  Diagnosis Date  . Hemolytic anemia ([place])   . History of epistaxis    [facility]  . Hypertension   . Pneumonia   . Squamous cell lung cancer, right ([place])   . Vocal cord nodule    benign      Review of Systems neg for any significant sore throat, dysphagia, itching, sneezing, nasal congestion or excess/ purulent secretions, fever, chills, sweats, unintended wt loss, pleuritic or exertional cp, hempoptysis, orthopnea pnd or change in chronic leg swelling. Also denies presyncope, palpitations, heartburn, abdominal pain, nausea, vomiting, diarrhea or change in bowel or urinary habits, dysuria,hematuria, rash, arthralgias, visual complaints, headache, numbness weakness or ataxia.     Objective:   Physical Exam  Gen. Pleasant, well-nourished, in no distress ENT - no thrush, no pallor/icterus,no post nasal drip Neck: No JVD, no thyromegaly, no carotid bruits Lungs: no use of accessory muscles, no dullness to percussion, decreased right base with rales no rhonchi , gauze dressing over Pleurx site, no tenderness Cardiovascular: Rhythm regular, heart sounds  normal, no murmurs or gallops, no peripheral edema Musculoskeletal: No deformities, no cyanosis or clubbing        Assessment & Plan:

## 2020-12-20 NOTE — Assessment & Plan Note (Signed)
Chest x-ray today. Based on this we will decide frequency of drainage. He does not seem to be having any evidence of pleural infection.  Daughter showed me a picture of drainage on Sunday and this appears blood-tinged pleural fluid.  Hemoglobin was stable so I do not feel that he is having bleeding in the pleural space.  Blood-tinged pleural fluid may be due to malignant effusion

## 2020-12-20 NOTE — Assessment & Plan Note (Signed)
Continue pulse oxygen device when he is outside, continues oxygen at home Family knows to keep saturation 90% and above

## 2020-12-20 NOTE — Patient Instructions (Signed)
  Use senna or colace daily for constipation Use dulcolax 10 mg daily as needed  COntinue prednisone CXR today - based on this , we will decide drainage  If BP <100, do not take atenolol Take lasix only as needed for swelling

## 2020-12-20 NOTE — Telephone Encounter (Signed)
Called and went over xray results per Dr Elsworth Soho with patient's wife, Richard Evans per DPR. All questions answered and Richard Evans expressed full understanding of result and Dr Bari Mantis recommendation to continue drainage twice/week- Wednesday & Sunday. Nothing further needed at this time.

## 2020-12-20 NOTE — Assessment & Plan Note (Signed)
I had a long discussion with him and his wife about hospice care.  I impressed upon him that he really needs hospice care because he is having a lot of issues and will be difficult for him to come to the office every time.  They will think about it.  Daughter [name] and his son-in-law are on board but patient himself is resistant. Current issues include -Pain, better controlled with oxycodone and Advil -Constipation -we discussed senna daily and use Dulcolax as needed  -Protein calorie malnutrition-protein shakes

## 2020-12-20 NOTE — Assessment & Plan Note (Signed)
Can stop taking atenolol. Use Lasix only if blood pressure more than 100 and if he has pedal edema

## 2020-12-21 NOTE — Telephone Encounter (Signed)
Patient's daughter [name] sent email letting you know patient's drainage from Wednesday   Dr. Elsworth Soho please advise if further recommendations   Good Afternoon this is [name]. My dad Richard Evans, wanted me to let you know, that we did drain him. We got about 450 ml. The color was still Red mixed in with the dark Amber. There was a small blood clot that had formed in the drain line. It came out as well.  He definitely is feeling better this afternoon, after draining this.   Thank you for all of your help today!

## 2020-12-21 NOTE — Telephone Encounter (Signed)
That is great. Lets continue twice a week drainage -Wednesday and Sunday If repeat drainage on Sunday remains 500 cc or lower, then we can go to once a week drainage

## 2021-01-01 ENCOUNTER — Telehealth: Payer: Self-pay | Admitting: Pulmonary Disease

## 2021-01-01 NOTE — Telephone Encounter (Signed)
Spoke with pt's hospice RN, Lawerance Bach  She states she got a few calls from the pt's spouse over the weekend about o2 sats  He is running between 85-88% on o2 4lpm- increases to 92-92% on 5lpm (both at rest) She states pt is totally asymptomatic, and wife is just constantly checking his sats  She is needing VO to increase o2 flow for the pt  Please advise, thanks!

## 2021-01-01 NOTE — Telephone Encounter (Signed)
OK to increase as needed to maintain satn 88% & above

## 2021-01-01 NOTE — Telephone Encounter (Signed)
I called and spoke with the nurse and notified her of response per Dr Elsworth Soho  She verbalized understanding  Nothing further needed

## 2021-01-05 ENCOUNTER — Telehealth: Payer: Self-pay | Admitting: Pulmonary Disease

## 2021-01-05 NOTE — Telephone Encounter (Signed)
Discussed with daughter and son-in-law. Drainage is about 500 cc every 3 days Explained that we could increase duration of drainage gradually He is now on hospice and finding it difficult to move Change visit on 2/14 to televisit

## 2021-01-05 NOTE — Telephone Encounter (Signed)
RA pt currently is scheduled to see you on 02/14 in the [place] office and the pts daughter has some concerns about the drainage tube and wanted to know if you want them to keep this appt or do a televisit sooner?   Please advise. thanks

## 2021-01-05 NOTE — Telephone Encounter (Signed)
Pt is currently in hopsice, was wondering if Dr. Elsworth Soho would like to do televisit or cancel appt, pt daughter also has questions about drainage tub in chest, has been having thick tissues coming thru. States that this  has happened before but it has been getting thicker, & more difficult to come out  please advise [phone]

## 2021-01-05 NOTE — Telephone Encounter (Signed)
Changed 2/14 office visit to now being a televisit. Per verbal from Dr Elsworth Soho, patient daughter is already aware of this. Nothing further needed at this time.

## 2021-01-08 ENCOUNTER — Ambulatory Visit: Payer: PPO

## 2021-01-08 ENCOUNTER — Telehealth: Payer: Self-pay | Admitting: Pulmonary Disease

## 2021-01-08 DIAGNOSIS — J90 Pleural effusion, not elsewhere classified: Secondary | ICD-10-CM

## 2021-01-08 NOTE — Telephone Encounter (Signed)
Make it a televisit.

## 2021-01-08 NOTE — Telephone Encounter (Signed)
Pt. Needs CXR and follow up after in the office. Sounds like there is no fluid to drain, which a CXR can confirm.

## 2021-01-08 NOTE — Telephone Encounter (Signed)
Called and spoke with patient's daughter who states that patient's pleurx is not draining. States that the last two times they have drained him it has not been normal. States that the time before they got tissue into the bottle and then yesterday when they tried to drain him they didn't really get any fluid but what they did get was red and had blood tinge. Patient is not complaining of any pain or discomfort at this time.  Sarah please advise as Dr. Elsworth Soho is off

## 2021-01-08 NOTE — Telephone Encounter (Signed)
Ok to place order under my name. When will it be done? Thanks

## 2021-01-08 NOTE — Telephone Encounter (Signed)
Called patient's daughter back. She is aware that the visit can be a televisit but still wants the CXR.   I called [facility] to see if they would be able to service this patient for a mobile CXR, they will be able to. They will need an order to be faxed to them at [phone] as well as their demographics.   Spoke with patient's daughter. She is aware of this and stated that they had already reached out to her to get more information. She is checking with Hospice to make sure he will be able to have the CXR without being discharged from their service.   Richard Evans, are you ok if I place the order under your name?

## 2021-01-08 NOTE — Telephone Encounter (Signed)
Spoke with patient's daughter [name]. She stated that she did hear back from Hospice and he will be able to get the mobile xray, Hospice will pay for it.   Patient has been scheduled for a televisit with SG on Wednesday at 4pm.   Will place order and fax to [facility] today.

## 2021-01-08 NOTE — Telephone Encounter (Signed)
Called and spoke with daughter who states that patient has transitioned to hospice care a couple weeks ago and is not able to get out of the house at this point. Any other recommendations?  Please advise

## 2021-01-09 ENCOUNTER — Encounter: Payer: Self-pay | Admitting: Acute Care

## 2021-01-09 ENCOUNTER — Telehealth: Payer: Self-pay | Admitting: Adult Health

## 2021-01-09 ENCOUNTER — Telehealth: Payer: Self-pay | Admitting: Pulmonary Disease

## 2021-01-09 DIAGNOSIS — J918 Pleural effusion in other conditions classified elsewhere: Secondary | ICD-10-CM | POA: Diagnosis not present

## 2021-01-09 NOTE — Telephone Encounter (Signed)
Called and spoke with pts daughter and she was made aware that we will call with the results as soon as we hear back from the provider regarding the CXR.  They are currently waiting for the mobile [facility] to come and take his cxr.  Will forward to RA and SG to make them aware

## 2021-01-09 NOTE — Telephone Encounter (Signed)
On Hospice with Pleurx.  Chrys Racer with [facility] [phone] CXR call report : small right pleural effusion and patchy aspdz.  Xray was to evaluate for Pleurx and Effusion and pain at pleurx site   Previous cxr with persistent atx w/in right mid and lower long and decreased right pl effusion .   Spoke with wife no fever or cough /congestion   Has constant pain at pleurx site , started on pain patch today by hospice .  No pleural fluid last 2 pleural drainages.   Will send to Dr. Elsworth Soho  For further recs:

## 2021-01-10 ENCOUNTER — Ambulatory Visit: Payer: PPO | Admitting: Acute Care

## 2021-01-10 NOTE — Telephone Encounter (Signed)
Please obtain CXR results & let me know

## 2021-01-10 NOTE — Telephone Encounter (Signed)
Spoke with wife, decrease drainge to weekly  Cont w/ hospice pain management .  If pain continues, and unable to drain , then can remove pleurx if necessary .  Pt wife aware and will call back if needed.

## 2021-01-10 NOTE — Telephone Encounter (Signed)
It is most likely going to be faxed to [place] because it was a mobile company that did it.

## 2021-01-10 NOTE — Telephone Encounter (Signed)
Can decrease pleurx drainage to once a week

## 2021-01-10 NOTE — Telephone Encounter (Signed)
I checked both RA and SG inboxes, did not see any xrays reports for patient. Xray report has been requested from [facility].

## 2021-01-12 ENCOUNTER — Telehealth: Payer: Self-pay | Admitting: Pulmonary Disease

## 2021-01-12 NOTE — Telephone Encounter (Signed)
Has some localized firmness at pleurx site. No redness or drainage. Unable to drain for last several days.  Still pain at site .  Advised to contact hospice nurse to see if they can evaluate . Was at home yesterday with no issues.  Has ov with Dr. Elsworth Soho  On 2/14 . Advised to keep ov and discuss d/c pleurx and manage symptoms at home with hospice  O2 sats good on O2 .  Please contact office for sooner follow up if symptoms do not improve or worsen or seek emergency care    Will send to Dr. Elsworth Soho

## 2021-01-12 NOTE — Telephone Encounter (Signed)
Called and spoke with the pt's spouse  She states that the pt has been noticing some swelling and "looks like something protruding" on his right side above the pleurex cath  She states that there is no redness, but the area is very painful and this just started about 24 hours ago  She states that they pulled back his bandage and looked at the tube and is appears to be clogged with tissue  Hospice nurse has been called but they are unsure if she will be able to come out today  Pt's spouse would like to speak with Tammy P about this  Please advise thanks!

## 2021-01-15 ENCOUNTER — Ambulatory Visit: Payer: PPO | Admitting: Pulmonary Disease

## 2021-01-15 ENCOUNTER — Encounter: Payer: Self-pay | Admitting: Pulmonary Disease

## 2021-01-15 DIAGNOSIS — J9611 Chronic respiratory failure with hypoxia: Secondary | ICD-10-CM | POA: Diagnosis not present

## 2021-01-15 DIAGNOSIS — C3491 Malignant neoplasm of unspecified part of right bronchus or lung: Secondary | ICD-10-CM

## 2021-01-15 DIAGNOSIS — J90 Pleural effusion, not elsewhere classified: Secondary | ICD-10-CM | POA: Diagnosis not present

## 2021-01-15 NOTE — Assessment & Plan Note (Signed)
He is now on hospice care.  He is rapidly declining.  pain continues to be an issue and is on round-the-clock narcotics with severe constipation. We discussed taking laxatives daily including suppositories as needed to avoid impaction. I explained that comfort care would be our goal

## 2021-01-15 NOTE — Progress Notes (Signed)
   Subjective:    Patient ID: Richard Evans, male    DOB: 08/30/1946, 75 y.o.   MRN: [id number]  HPI  74yoex heavy smokerfor FU of COPD,chronic hypoxic respiratory failure on O2 since 09/2020&RLL lung cancer Recurrent right effusion status post Pleurx catheter 11/10/20  PMH -06/2016 CLL - presented withlymphocytosis,lymphadenopathy andautoimmune hemolytic anemia He was treated with high-dose steroids in 2017 and then Rituxan in 2018  He declined chemotherapy. Due to recurrent right pleural effusion, we placed Pleurx catheter. Daughter [name] and son-in-law who are EMTs have been assisting with home drainage. 1/12 -they called regarding pain after drainage, this resolved within a day. 1/18 phone call, blood pressure in the 80s and red-colored tinge to pleural fluid, used to be amber 1/19 aten stopped due to soft BP  Chief Complaint  Patient presents with  . Follow-up    Vomiting green bile this morning, swelling on right side where Pleux is, last time pleurx drained was 2/9 with minimal draingage   He is doing a lot worse, accompanied by his son-in-law and wife today. Continues to have right hypochondriac pain , he is on round-the-clock narcotics now.  Developed severe constipation and required disimpaction. He is more short of breath, on continuous oxygen, arrives in a wheelchair. They are concerned about Pleurx catheter, minimal drainage 3 days ago Numerous phone notes reviewed  Significant tests/ events reviewed  CT chest WO contrast 09/2020 >>Enlarging right lower lobe mass with multiple new right lung nodules.  Mediastinal, hilar and upper abdominal adenopathy is grossly stable from 03/07/2020 in this patient with a history of CLL. New small bilateral pleural effusions, partially loculated and slightly larger on the right   Spirometry1/2018 -no evidence of airway obstruction with FEV1 of 85% and FVC of 81%   Review of Systems neg for any significant sore  throat, dysphagia, itching, sneezing, nasal congestion or excess/ purulent secretions, fever, chills, sweats,  hempoptysis, orthopnea pnd or change in chronic leg swelling.  Also denies presyncope, palpitations, heartburn, abdominal pain, nausea, vomiting, diarrhea or change in bowel or urinary habits, dysuria,hematuria, rash, arthralgias, visual complaints, headache, numbness weakness or ataxia.     Objective:   Physical Exam  Gen. chronically ill-appearing, elderly man, in wheelchair, on oxygen in no distress ENT - no thrush, no pallor/icterus,no post nasal drip Neck: No JVD, no thyromegaly, no carotid bruits Lungs: no use of accessory muscles, no dullness to percussion, decreased breath sounds on right with rales no rhonchi  Cardiovascular: Rhythm regular, heart sounds  normal, no murmurs or gallops, 2+ peripheral edema below waist and sacrum Musculoskeletal: No deformities, no cyanosis or clubbing  Pleurx catheter site, no redness or tenderness       Assessment & Plan:

## 2021-01-15 NOTE — Assessment & Plan Note (Signed)
We attempted Pleurx catheter drainage, minimal less than 5 cc of fluid obtained.  No redness at catheter site I am not worried about infection. Reviewed chest x-ray -on son-in-law's phone-right lower lobe opacity persists-this may be related to increasing lung mass rather than fluid.  I have asked him to decrease drainage to every 2 weeks

## 2021-01-15 NOTE — Patient Instructions (Signed)
Resume lasix 20 mg daily if BP 100 or above

## 2021-01-15 NOTE — Assessment & Plan Note (Signed)
Continue oxygen at all times

## 2021-01-16 NOTE — Telephone Encounter (Signed)
Pt was seen on 2.14.22 by Dr. Elsworth Soho. CXR was reviewed at visit. Will close encounter.

## 2021-01-16 NOTE — Telephone Encounter (Signed)
Seen on OV & al issues addressed

## 2021-01-19 DIAGNOSIS — J9600 Acute respiratory failure, unspecified whether with hypoxia or hypercapnia: Secondary | ICD-10-CM | POA: Diagnosis not present

## 2021-01-19 DIAGNOSIS — I10 Essential (primary) hypertension: Secondary | ICD-10-CM | POA: Diagnosis not present

## 2021-01-19 DIAGNOSIS — C8590 Non-Hodgkin lymphoma, unspecified, unspecified site: Secondary | ICD-10-CM | POA: Diagnosis not present

## 2021-01-19 DIAGNOSIS — A419 Sepsis, unspecified organism: Secondary | ICD-10-CM | POA: Diagnosis not present

## 2021-01-19 DIAGNOSIS — Z87891 Personal history of nicotine dependence: Secondary | ICD-10-CM | POA: Diagnosis not present

## 2021-01-30 DEATH — deceased

## 2021-11-07 IMAGING — CT CT CHEST W/ CM
2 of 5 series · 12 of 36 positions shown, 15 images · IV contrast (OMNIPAQUE)
Comparison: Chest abdomen pelvis CT 11/18/2017. Abdomen pelvis CT
11/29/2018.

CLINICAL DATA: Lymphoma.

EXAM:
CT CHEST, ABDOMEN, AND PELVIS WITH CONTRAST
TECHNIQUE: Multidetector CT imaging of the chest, abdomen and pelvis was
performed following the standard protocol during bolus
administration of intravenous contrast.
CONTRAST:  100mL OMNIPAQUE IOHEXOL 300 MG/ML  SOLN

[Series 2: cap with · axial · 0.86mm/px · z∈[-605,-45]mm · 9 of 140 slices shown, 12 images]
[im 14/140  mediastinal]
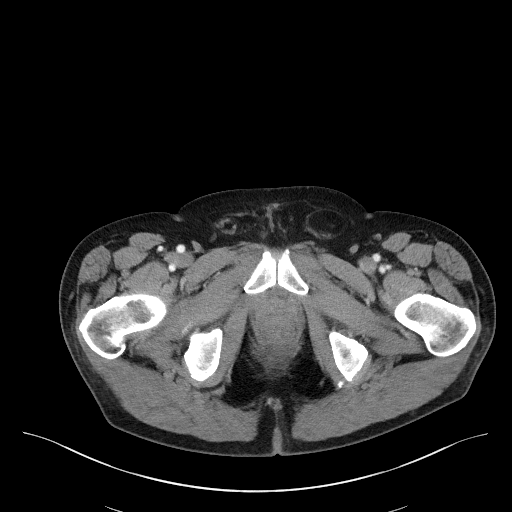
[im 14/140  lung]
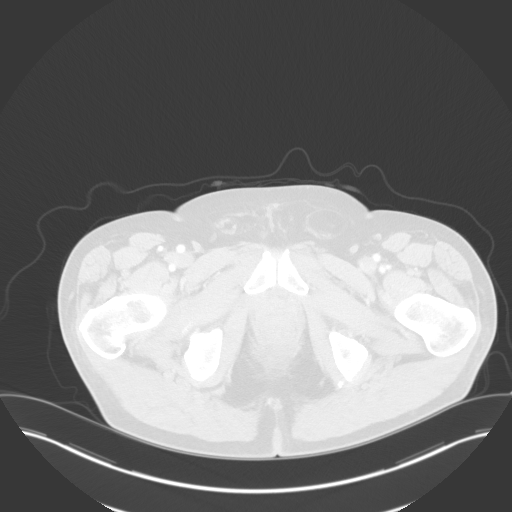
[im 28/140  lung]
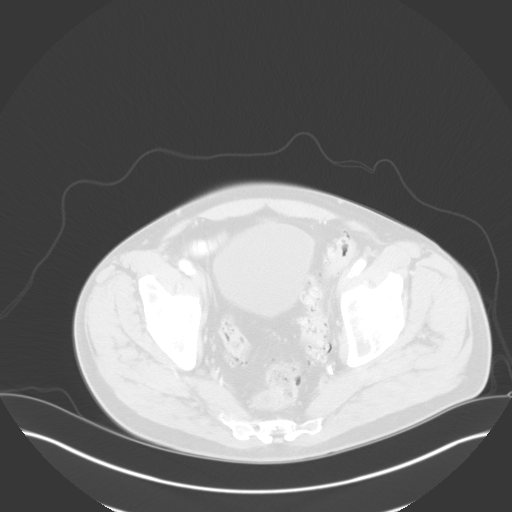
[im 42/140  lung]
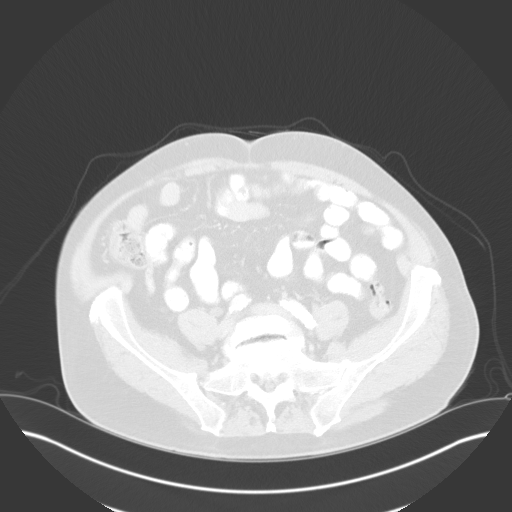
[im 56/140  lung]
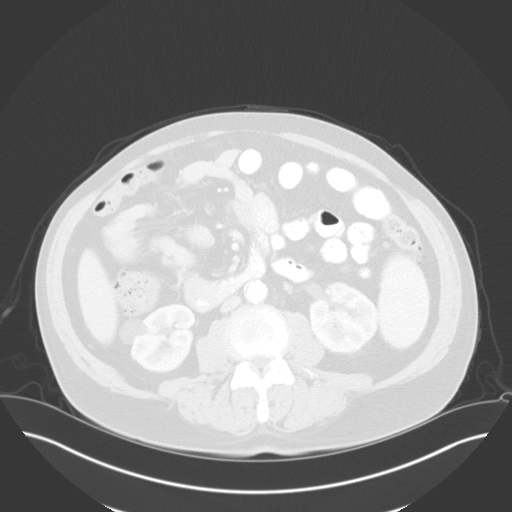
[im 70/140  mediastinal]
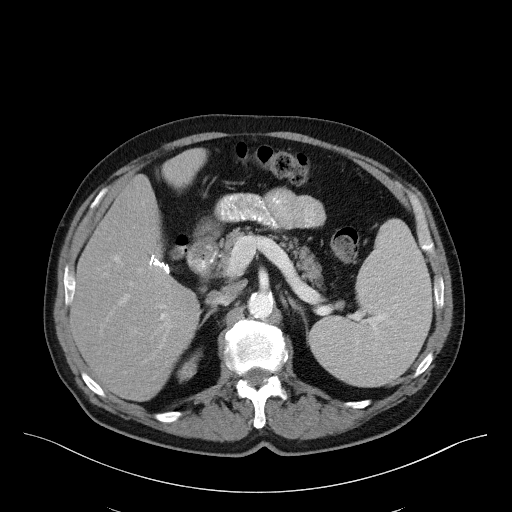
[im 70/140  lung]
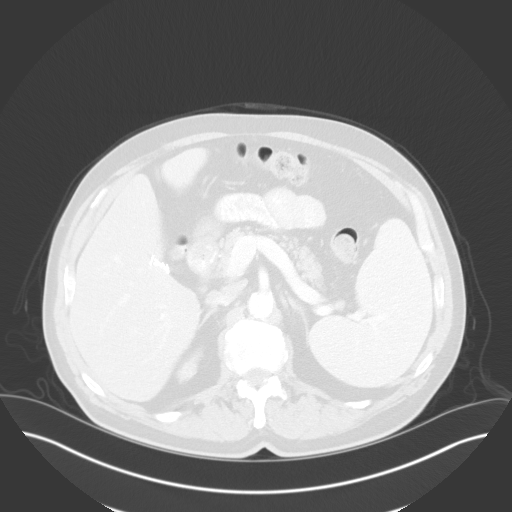
[im 84/140  lung]
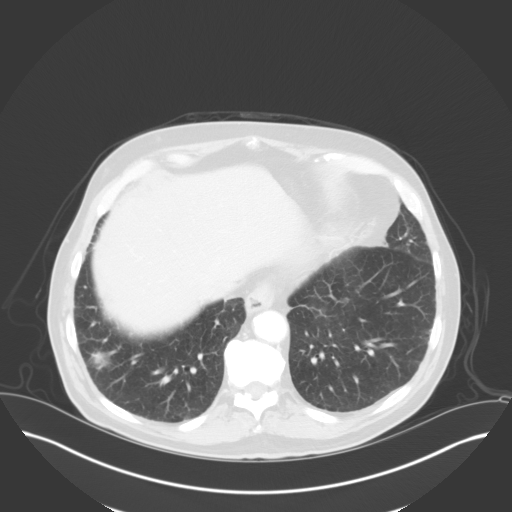
[im 98/140  lung]
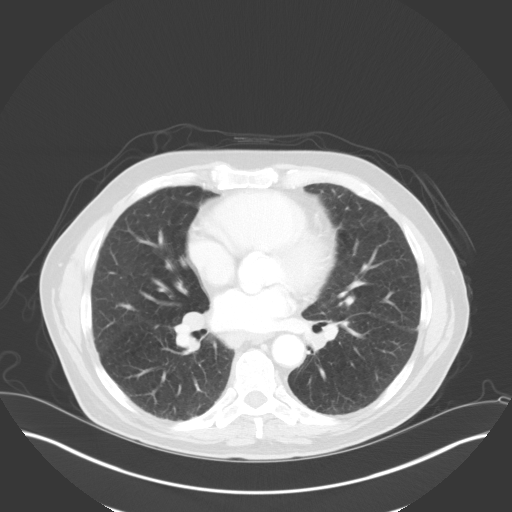
[im 112/140  lung]
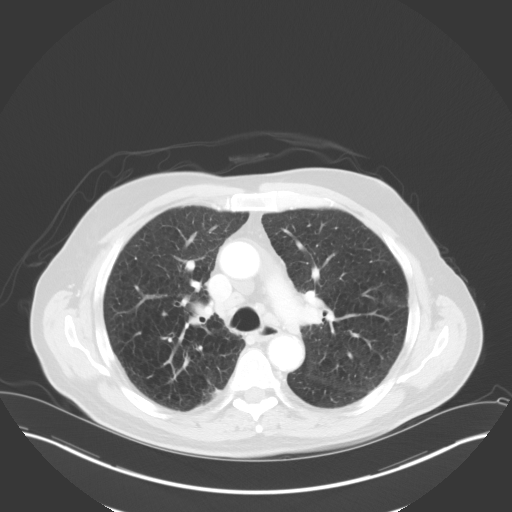
[im 126/140  mediastinal]
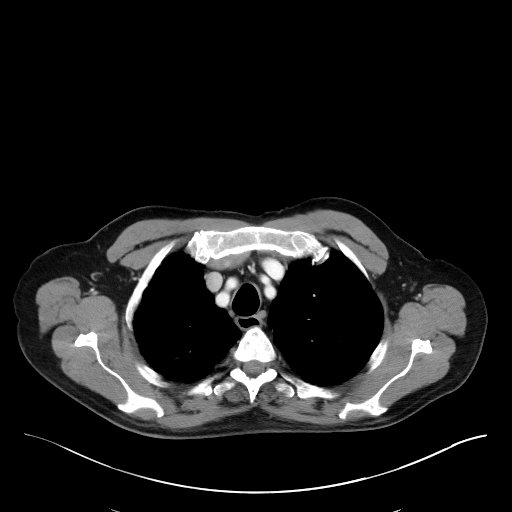
[im 126/140  lung]
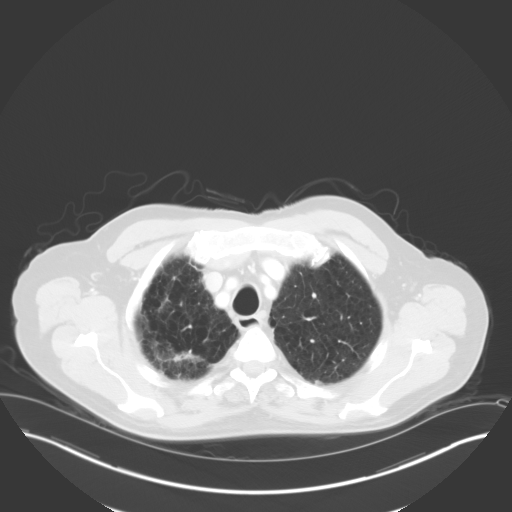

[Series 4: coronals · coronal · 1.02mm/px · 3 of 137 slices shown]
[im 28/137  lung]
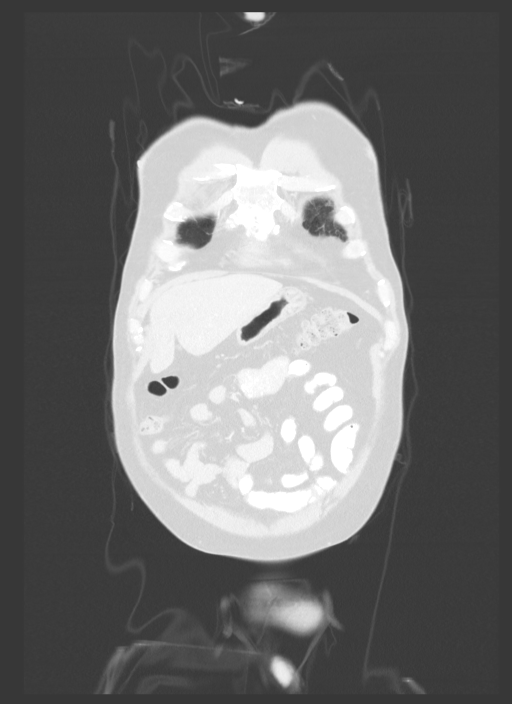
[im 55/137  lung]
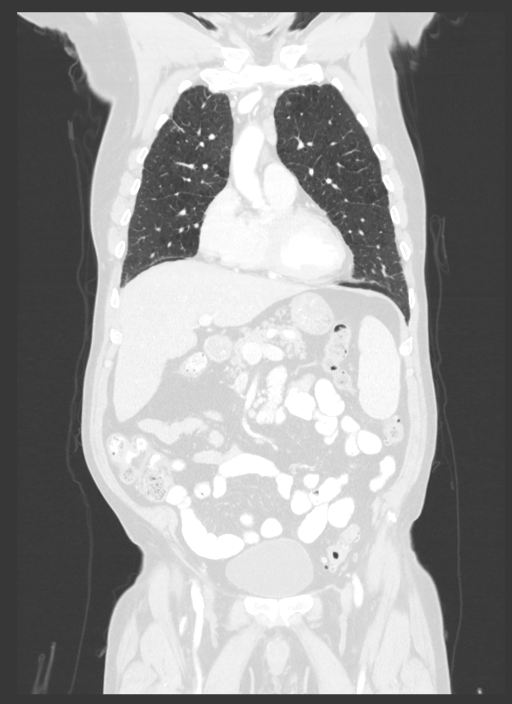
[im 82/137  lung]
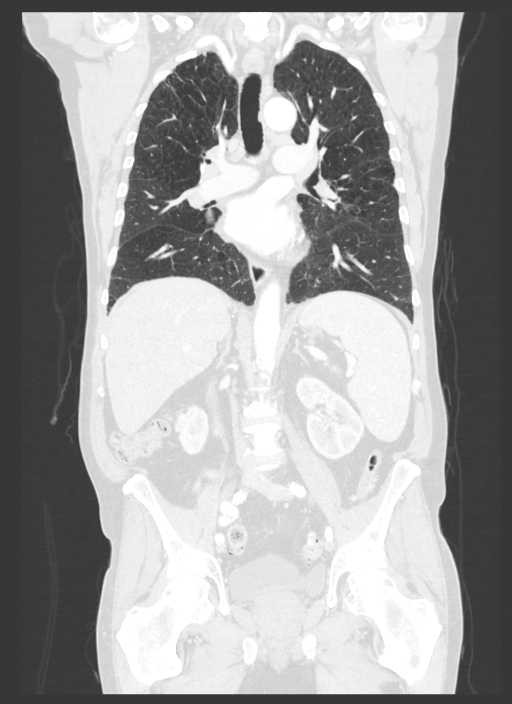

[12 of 36 positions shown; findings below may reference images not displayed]

FINDINGS: CT CHEST FINDINGS

Cardiovascular: The heart size is normal. No substantial pericardial
effusion. Coronary artery calcification is evident. Atherosclerotic
calcification is noted in the wall of the thoracic aorta.

Mediastinum/Nodes: Mediastinal lymphadenopathy evident. Index
subcarinal lymph node measures 2.4 cm short axis compared to 1.4 cm
on 11/18/2017. 2 cm short axis right hilar node on today's study was
1.2 cm in 9563. Left hilar lymphadenopathy measures 1.9 cm compared
to 1.2 cm previously. The esophagus has normal imaging features.
There is no axillary lymphadenopathy.

Lungs/Pleura: Centrilobular and paraseptal emphysema evident.
Architectural distortion and scarring in the right upper lobe is
similar to 9563.

2.9 x 2.2 cm right lower lobe pulmonary nodule identified on 141/6,
increased from 1.7 x 1.[address] abdomen CT of 01/26/2018.

No suspicious pulmonary nodule or mass in the left lung.

Musculoskeletal: No worrisome lytic or sclerotic osseous
abnormality.

CT ABDOMEN PELVIS FINDINGS

Hepatobiliary: No suspicious focal abnormality within the liver
parenchyma. Gallbladder is surgically absent. No intrahepatic or
extrahepatic biliary dilation.

Pancreas: No focal mass lesion. No dilatation of the main duct. No
intraparenchymal cyst. No peripancreatic edema.

Spleen: 15.1 cm craniocaudal length, mild splenomegaly. Tiny
hypodensity in the posterior spleen (81/2) is stable in the interval
since 5435.

Adrenals/Urinary Tract: No adrenal nodule or mass. 2.2 cm cyst noted
interpolar right kidney with another 2.3 cm cyst in the lower pole
right kidney. Left kidney unremarkable No evidence for hydroureter.
Left bladder wall diverticulum noted.

Stomach/Bowel: Stomach is unremarkable. No gastric wall thickening.
No evidence of outlet obstruction. Duodenum is normally positioned
as is the ligament of Treitz. No small bowel wall thickening. No
small bowel dilatation. The terminal ileum is normal. The appendix
is normal. No gross colonic mass. No colonic wall thickening.
Diverticular changes are noted in the left colon without evidence of
diverticulitis.

Vascular/Lymphatic: There is abdominal aortic atherosclerosis
without aneurysm. There is no gastrohepatic or hepatoduodenal
ligament lymphadenopathy. Upper normal lymph nodes in the
hepatoduodenal ligament measure up to 10 mm short axis, not
substantially changed since 5435. No gastrohepatic ligament
lymphadenopathy. No retroperitoneal lymphadenopathy although
scattered small para-aortic nodes are similar to prior. No pelvic
sidewall lymphadenopathy.

Reproductive: Prostate gland appears enlarged.

Other: No intraperitoneal free fluid. Vague area of ill-defined soft
tissue is identified in the central mesentery measuring 2.2 x 1.4 x
2.1 cm on axial image 83 of series 2 and well demonstrated on
sagittal image 80 of series 5. this does not appear to be a lymph
node, but mesenteric involvement by lymphoma cannot be excluded.
There are multiple adjacent small mesenteric lymph nodes.

Musculoskeletal: Small left groin hernia contains only fat. No
worrisome lytic or sclerotic osseous abnormality. Bilateral pars
interarticularis defects are seen at L5.
IMPRESSION: 1. Interval progression of mediastinal and bilateral hilar
lymphadenopathy compared to the most recent comparison chest CT of
11/18/2017.
[DATE]. [DATE] x 2.2 cm right lower lobe pulmonary nodule has progressed in
the interval since abdomen/pelvis CT of 01/26/2018 and new since
chest CT of 11/18/2017. Parenchymal involvement by lymphoma would be
a consideration. Primary bronchogenic neoplasm not excluded. PET-CT
may prove helpful to further evaluate.
3. 2.2 cm focus of ill-defined soft tissue attenuation in the
central mesentery. This does not appear to be a lymph node, but
mesenteric involvement by lymphoma cannot be excluded. Close
attention on follow-up recommended.
4. Upper normal lymph nodes in the hepatoduodenal ligament, stable.
5. Mild splenomegaly.
6. Prostatomegaly.
7. Small left groin hernia contains only fat.
8. Aortic Atherosclerosis ([id number]-IG7.7) and Emphysema ([id number].C).
